# Patient Record
Sex: Male | Born: 1955 | Race: White | Hispanic: No | Marital: Married | Smoking: Current every day smoker
Health system: Southern US, Community
[De-identification: ages and names within clinical notes are randomized; demographics above are authoritative.]

## PROBLEM LIST (undated history)

## (undated) DIAGNOSIS — N189 Chronic kidney disease, unspecified: Secondary | ICD-10-CM

## (undated) DIAGNOSIS — R918 Other nonspecific abnormal finding of lung field: Secondary | ICD-10-CM

## (undated) DIAGNOSIS — F419 Anxiety disorder, unspecified: Secondary | ICD-10-CM

## (undated) DIAGNOSIS — I10 Essential (primary) hypertension: Secondary | ICD-10-CM

## (undated) DIAGNOSIS — J449 Chronic obstructive pulmonary disease, unspecified: Secondary | ICD-10-CM

## (undated) DIAGNOSIS — R011 Cardiac murmur, unspecified: Secondary | ICD-10-CM

## (undated) DIAGNOSIS — M1990 Unspecified osteoarthritis, unspecified site: Secondary | ICD-10-CM

## (undated) DIAGNOSIS — M549 Dorsalgia, unspecified: Secondary | ICD-10-CM

## (undated) DIAGNOSIS — G4730 Sleep apnea, unspecified: Secondary | ICD-10-CM

## (undated) DIAGNOSIS — IMO0001 Reserved for inherently not codable concepts without codable children: Secondary | ICD-10-CM

## (undated) DIAGNOSIS — M5090 Cervical disc disorder, unspecified, unspecified cervical region: Secondary | ICD-10-CM

## (undated) HISTORY — PX: OTHER SURGICAL HISTORY: SHX169

## (undated) HISTORY — PX: BACK SURGERY: SHX140

---

## 2002-05-28 ENCOUNTER — Encounter: Payer: Self-pay | Admitting: Family Medicine

## 2002-05-28 ENCOUNTER — Ambulatory Visit (HOSPITAL_COMMUNITY): Admission: RE | Admit: 2002-05-28 | Discharge: 2002-05-28 | Payer: Self-pay | Admitting: Family Medicine

## 2008-05-01 ENCOUNTER — Ambulatory Visit (HOSPITAL_COMMUNITY): Admission: RE | Admit: 2008-05-01 | Discharge: 2008-05-01 | Payer: Self-pay | Admitting: Family Medicine

## 2008-09-25 ENCOUNTER — Ambulatory Visit (HOSPITAL_COMMUNITY): Admission: RE | Admit: 2008-09-25 | Discharge: 2008-09-25 | Payer: Self-pay | Admitting: Family Medicine

## 2008-12-30 ENCOUNTER — Emergency Department (HOSPITAL_COMMUNITY): Admission: EM | Admit: 2008-12-30 | Discharge: 2008-12-30 | Payer: Self-pay | Admitting: Emergency Medicine

## 2009-01-03 ENCOUNTER — Ambulatory Visit (HOSPITAL_COMMUNITY): Admission: RE | Admit: 2009-01-03 | Discharge: 2009-01-03 | Payer: Self-pay | Admitting: Family Medicine

## 2010-05-15 ENCOUNTER — Emergency Department (HOSPITAL_COMMUNITY)
Admission: EM | Admit: 2010-05-15 | Discharge: 2010-05-15 | Payer: Self-pay | Source: Home / Self Care | Admitting: Emergency Medicine

## 2010-09-20 LAB — BASIC METABOLIC PANEL
Chloride: 106 mEq/L (ref 96–112)
GFR calc Af Amer: >60 mL/min (ref >60)
Potassium: 4.4 mEq/L (ref 3.5–5.1)
Sodium: 139 mEq/L (ref 135–145)

## 2010-09-20 LAB — URINALYSIS, ROUTINE W REFLEX MICROSCOPIC
Ketones, ur: NEGATIVE mg/dL
Nitrite: NEGATIVE
Protein, ur: NEGATIVE mg/dL
Urobilinogen, UA: 0.2 mg/dL (ref 0.0–1.0)
pH: 6 (ref 5.0–8.0)

## 2010-09-20 LAB — DIFFERENTIAL
Eosinophils Absolute: 0.3 10*3/uL (ref 0.0–0.7)
Eosinophils Relative: 4 % (ref 0–5)
Lymphocytes Relative: 29 % (ref 12–46)
Lymphs Abs: 2.7 10*3/uL (ref 0.7–4.0)
Monocytes Relative: 7 % (ref 3–12)

## 2010-09-20 LAB — CBC
HCT: 47.5 % (ref 39.0–52.0)
Hemoglobin: 16.4 g/dL (ref 13.0–17.0)
MCV: 91 fL (ref 78.0–100.0)
RBC: 5.22 MIL/uL (ref 4.22–5.81)
WBC: 9.4 10*3/uL (ref 4.0–10.5)

## 2010-09-20 LAB — ETHANOL: Alcohol, Ethyl (B): <5 mg/dL (ref 0–10)

## 2010-09-20 LAB — RAPID URINE DRUG SCREEN, HOSP PERFORMED: Cocaine: NOT DETECTED

## 2011-10-05 NOTE — H&P (Signed)
Note made in error.   ROS  Physical Exam

## 2011-10-05 NOTE — H&P (Signed)
  NTS SOAP Note  Vital Signs:  Vitals as of: 10/05/2011: Systolic 169: Diastolic 82: Heart Rate 75: Temp 99.13F: Height 28ft 7in: Weight 155Lbs 0 Ounces: OFC Not Entered: Respiratory Rate Not Entered: O2 Saturation Not Entered: Pain Level Not Entered: BMI 24  BMI : 24.28 kg/m2  Subjective: This 29 Years 59 Months old Male presents forscreening TCS.  Never has had a colonoscopy.  No family h/o colon cancer.  No specific GI complaints.  Review of Symptoms:  Constitutional:unremarkable Head:unremarkable Eyes:blurred vision bilateral sinus Cardiovascular:unremarkable Respiratory:dyspnea Gastrointestinheartburn Genitourinary:unremarkable joint, back, neck pain Skin:unremarkable Hematolgic/Lymphatic:unremarkable Allergic/Immunologic:unremarkable   Past Medical History:Reviewed   Past Medical History  Surgical History: unremarkable Medical Problems: HTN Allergies: prozac Medications: prednisone, alprazolam, HTCZ, prednisone   Social History:Reviewed  Social History  Preferred Language: English ([place]) Race:  White Ethnicity: Not Hispanic / Latino Age: 62 Years 7 Months Marital Status:  M Alcohol:  No Recreational drug(s):  No   Smoking Status: Current every day smoker reviewed on 10/05/2011 Started Date: 06/14/1973 Packs per day: 1.00   Family History:Reviewed   Family History  Is there a family history ZO:XWRUEA h/o prostate cancer.  No h/o colon cancer    Objective Information: General:Well appearing, well nourished in no distress. Heart:RRR, no murmur or gallop.  Normal S1, S2.  No S3, S4.  Lungs:CTA bilaterally, no wheezes, rhonchi, rales.  Breathing unlabored. Abdomen:Soft, NT/ND, no HSM, no masses. deferred to procedure  Assessment:Need for screening TCS  Diagnosis &amp; Procedure: DiagnosisCode: V76.51, ProcedureCode: 54098,    Plan:Scheduled for TCS on 10/19/11.   Patient  Education:Alternative treatments to surgery were discussed with patient (and family).Risks and benefits  of procedure were fully explained to the patient (and family) who gave informed consent. Patient/family questions were addressed.  Follow-up:Pending Surgery

## 2011-10-18 MED ORDER — SODIUM CHLORIDE 0.45 % IV SOLN
Freq: Once | INTRAVENOUS | Status: AC
  Administered 2011-10-19: 1000 mL via INTRAVENOUS

## 2011-10-19 ENCOUNTER — Encounter (HOSPITAL_COMMUNITY): Payer: Self-pay | Admitting: *Deleted

## 2011-10-19 ENCOUNTER — Ambulatory Visit (HOSPITAL_COMMUNITY)
Admission: RE | Admit: 2011-10-19 | Discharge: 2011-10-19 | Disposition: A | Discharge status: Home / Self Care | Payer: 59 | Source: Ambulatory Visit | Attending: General Surgery | Admitting: General Surgery

## 2011-10-19 ENCOUNTER — Encounter (HOSPITAL_COMMUNITY)
Admission: RE | Disposition: A | Discharge status: Home / Self Care | Payer: Self-pay | Source: Ambulatory Visit | Attending: General Surgery

## 2011-10-19 DIAGNOSIS — I10 Essential (primary) hypertension: Secondary | ICD-10-CM | POA: Insufficient documentation

## 2011-10-19 DIAGNOSIS — Z79899 Other long term (current) drug therapy: Secondary | ICD-10-CM | POA: Insufficient documentation

## 2011-10-19 DIAGNOSIS — Z1211 Encounter for screening for malignant neoplasm of colon: Secondary | ICD-10-CM | POA: Insufficient documentation

## 2011-10-19 HISTORY — DX: Anxiety disorder, unspecified: F41.9

## 2011-10-19 HISTORY — DX: Dorsalgia, unspecified: M54.9

## 2011-10-19 HISTORY — PX: COLONOSCOPY: SHX5424

## 2011-10-19 SURGERY — COLONOSCOPY
Anesthesia: Moderate Sedation

## 2011-10-19 MED ORDER — STERILE WATER FOR IRRIGATION IR SOLN
Status: DC | PRN
  Administered 2011-10-19: 10:00:00

## 2011-10-19 MED ORDER — MEPERIDINE HCL 25 MG/ML IJ SOLN
INTRAMUSCULAR | Status: DC | PRN
  Administered 2011-10-19: 50 mg via INTRAVENOUS
  Administered 2011-10-19: 25 mg via INTRAVENOUS

## 2011-10-19 MED ORDER — MIDAZOLAM HCL 5 MG/5ML IJ SOLN
INTRAMUSCULAR | Status: DC | PRN
  Administered 2011-10-19 (×2): 1 mg via INTRAVENOUS
  Administered 2011-10-19: 4 mg via INTRAVENOUS

## 2011-10-19 MED ORDER — MEPERIDINE HCL 50 MG/ML IJ SOLN
INTRAMUSCULAR | Status: AC
  Filled 2011-10-19: qty 1

## 2011-10-19 MED ORDER — MIDAZOLAM HCL 2 MG/2ML IJ SOLN
INTRAMUSCULAR | Status: AC
  Filled 2011-10-19: qty 2

## 2011-10-19 MED ORDER — MIDAZOLAM HCL 5 MG/5ML IJ SOLN
INTRAMUSCULAR | Status: AC
  Filled 2011-10-19: qty 5

## 2011-10-19 NOTE — Interval H&P Note (Signed)
History and Physical Interval Note:  10/19/2011 9:35 AM  Russell [name]  has presented today for surgery, with the diagnosis of Special screening for malignant neoplasms, colon  The various methods of treatment have been discussed with the patient and family. After consideration of risks, benefits and other options for treatment, the patient has consented to  Procedure(s) (LRB): COLONOSCOPY (N/A) as a surgical intervention .  The patients' history has been reviewed, patient examined, no change in status, stable for surgery.  I have reviewed the patients' chart and labs.  Questions were answered to the patient's satisfaction.     Franky Macho A

## 2011-10-19 NOTE — Op Note (Signed)
[facility] [address] [place], [place]  [address]  COLONOSCOPY PROCEDURE REPORT  PATIENT:  Russell [name], Russell [name]  MR#:  [id number] BIRTHDATE:  04-21-1956, 55 yrs. old  GENDER:  male ENDOSCOPIST:  Franky Macho, MD REF. BY:  Gareth Morgan, M.D. PROCEDURE DATE:  10/19/2011 PROCEDURE:  Average-risk screening colonoscopy G0121 ASA CLASS:  Class II INDICATIONS:  Screening MEDICATIONS:   Versed 6 mg IV, demerol 75 mg IV  DESCRIPTION OF PROCEDURE:   After the risks benefits and alternatives of the procedure were thoroughly explained, informed consent was obtained.  Digital rectal exam was performed and revealed no abnormalities.   The [id number] ([id number]) endoscope was introduced through the anus and advanced to the cecum, which was identified by both the appendix and ileocecal valve, without limitations.  The quality of the prep was adequate..  The instrument was then slowly withdrawn as the colon was fully examined.  FINDINGS:  The cecum, ascending colon, transverse colon, descending colon, sigmoid colon, and rectum were all within normal limits.  Retroflexed views in the rectum revealed it was not tolerated by the patient. The scope was then withdrawn  from the cecum and the procedure completed. COMPLICATIONS:  None ENDOSCOPIC IMPRESSION: 1) Normal colon 2) It was not tolerated by the patient. RECOMMENDATIONS:  REPEAT EXAM:  In 10 year(s) for Colonoscopy.  ______________________________ Franky Macho, MD  CC:  Gareth Morgan MD  n. Rosalie DoctorFranky Macho at 10/19/2011 09:53 AM  Wylie Hail, [phone]

## 2011-10-19 NOTE — Discharge Instructions (Signed)
Colonoscopy Care After Read the instructions outlined below and refer to this sheet in the next few weeks. These discharge instructions provide you with general information on caring for yourself after you leave the hospital. Your doctor may also give you specific instructions. While your treatment has been planned according to the most current medical practices available, unavoidable complications occasionally occur. If you have any problems or questions after discharge, call your doctor. HOME CARE INSTRUCTIONS ACTIVITY:  You may resume your regular activity, but move at a slower pace for the next 24 hours.   Take frequent rest periods for the next 24 hours.   Walking will help get rid of the air and reduce the bloated feeling in your belly (abdomen).   No driving for 24 hours (because of the medicine (anesthesia) used during the test).   You may shower.   Do not sign any important legal documents or operate any machinery for 24 hours (because of the anesthesia used during the test).  NUTRITION:  Drink plenty of fluids.   You may resume your normal diet as instructed by your doctor.   Begin with a light meal and progress to your normal diet. Heavy or fried foods are harder to digest and may make you feel sick to your stomach (nauseated).   Avoid alcoholic beverages for 24 hours or as instructed.  MEDICATIONS:  You may resume your normal medications unless your doctor tells you otherwise.  WHAT TO EXPECT TODAY:  Some feelings of bloating in the abdomen.   Passage of more gas than usual.   Spotting of blood in your stool or on the toilet paper.  IF YOU HAD POLYPS REMOVED DURING THE COLONOSCOPY:  No aspirin products for 7 days or as instructed.   No alcohol for 7 days or as instructed.   Eat a soft diet for the next 24 hours.  FINDING OUT THE RESULTS OF YOUR TEST Not all test results are available during your visit. If your test results are not back during the visit, make an  appointment with your caregiver to find out the results. Do not assume everything is normal if you have not heard from your caregiver or the medical facility. It is important for you to follow up on all of your test results.  SEEK IMMEDIATE MEDICAL CARE IF:  You have more than a spotting of blood in your stool.   Your belly is swollen (abdominal distention).   You are nauseated or vomiting.   You have a fever.   You have abdominal pain or discomfort that is severe or gets worse throughout the day.  Document Released: 01/13/2004 Document Revised: 05/20/2011 Document Reviewed: 01/11/2008 ExitCare Patient Information 2012 [employer]. 

## 2011-10-29 ENCOUNTER — Encounter (HOSPITAL_COMMUNITY): Payer: Self-pay | Admitting: General Surgery

## 2012-02-22 ENCOUNTER — Ambulatory Visit (HOSPITAL_COMMUNITY)
Admission: RE | Admit: 2012-02-22 | Discharge: 2012-02-22 | Disposition: A | Discharge status: Home / Self Care | Payer: 59 | Source: Ambulatory Visit | Attending: Family Medicine | Admitting: Family Medicine

## 2012-02-22 ENCOUNTER — Other Ambulatory Visit (HOSPITAL_COMMUNITY): Payer: Self-pay | Admitting: Family Medicine

## 2012-02-22 DIAGNOSIS — M25529 Pain in unspecified elbow: Secondary | ICD-10-CM | POA: Insufficient documentation

## 2012-02-22 DIAGNOSIS — R059 Cough, unspecified: Secondary | ICD-10-CM | POA: Insufficient documentation

## 2012-02-22 DIAGNOSIS — R05 Cough: Secondary | ICD-10-CM

## 2012-02-22 DIAGNOSIS — M79609 Pain in unspecified limb: Secondary | ICD-10-CM | POA: Insufficient documentation

## 2014-05-16 ENCOUNTER — Encounter (HOSPITAL_COMMUNITY): Payer: Self-pay | Admitting: *Deleted

## 2014-05-16 ENCOUNTER — Inpatient Hospital Stay (HOSPITAL_COMMUNITY)
Admission: EM | Admit: 2014-05-16 | Discharge: 2014-05-17 | DRG: 292 | Disposition: A | Discharge status: Home / Self Care | Payer: BC Managed Care – PPO | Attending: Internal Medicine | Admitting: Internal Medicine

## 2014-05-16 ENCOUNTER — Emergency Department (HOSPITAL_COMMUNITY): Payer: BC Managed Care – PPO

## 2014-05-16 DIAGNOSIS — I509 Heart failure, unspecified: Secondary | ICD-10-CM | POA: Diagnosis not present

## 2014-05-16 DIAGNOSIS — F419 Anxiety disorder, unspecified: Secondary | ICD-10-CM | POA: Diagnosis present

## 2014-05-16 DIAGNOSIS — Z7952 Long term (current) use of systemic steroids: Secondary | ICD-10-CM

## 2014-05-16 DIAGNOSIS — N281 Cyst of kidney, acquired: Secondary | ICD-10-CM | POA: Diagnosis present

## 2014-05-16 DIAGNOSIS — Z8249 Family history of ischemic heart disease and other diseases of the circulatory system: Secondary | ICD-10-CM | POA: Diagnosis not present

## 2014-05-16 DIAGNOSIS — Z823 Family history of stroke: Secondary | ICD-10-CM

## 2014-05-16 DIAGNOSIS — R072 Precordial pain: Secondary | ICD-10-CM

## 2014-05-16 DIAGNOSIS — J849 Interstitial pulmonary disease, unspecified: Secondary | ICD-10-CM | POA: Diagnosis present

## 2014-05-16 DIAGNOSIS — Z886 Allergy status to analgesic agent status: Secondary | ICD-10-CM

## 2014-05-16 DIAGNOSIS — G8929 Other chronic pain: Secondary | ICD-10-CM | POA: Diagnosis present

## 2014-05-16 DIAGNOSIS — M549 Dorsalgia, unspecified: Secondary | ICD-10-CM | POA: Insufficient documentation

## 2014-05-16 DIAGNOSIS — R911 Solitary pulmonary nodule: Secondary | ICD-10-CM | POA: Diagnosis present

## 2014-05-16 DIAGNOSIS — I10 Essential (primary) hypertension: Secondary | ICD-10-CM | POA: Diagnosis present

## 2014-05-16 DIAGNOSIS — F17210 Nicotine dependence, cigarettes, uncomplicated: Secondary | ICD-10-CM | POA: Diagnosis present

## 2014-05-16 DIAGNOSIS — F411 Generalized anxiety disorder: Secondary | ICD-10-CM | POA: Insufficient documentation

## 2014-05-16 DIAGNOSIS — M51369 Other intervertebral disc degeneration, lumbar region without mention of lumbar back pain or lower extremity pain: Secondary | ICD-10-CM | POA: Insufficient documentation

## 2014-05-16 DIAGNOSIS — R0602 Shortness of breath: Secondary | ICD-10-CM

## 2014-05-16 DIAGNOSIS — J90 Pleural effusion, not elsewhere classified: Secondary | ICD-10-CM | POA: Diagnosis present

## 2014-05-16 DIAGNOSIS — M5489 Other dorsalgia: Secondary | ICD-10-CM

## 2014-05-16 DIAGNOSIS — R0600 Dyspnea, unspecified: Secondary | ICD-10-CM | POA: Diagnosis present

## 2014-05-16 DIAGNOSIS — Z720 Tobacco use: Secondary | ICD-10-CM | POA: Diagnosis present

## 2014-05-16 DIAGNOSIS — R079 Chest pain, unspecified: Secondary | ICD-10-CM | POA: Diagnosis present

## 2014-05-16 HISTORY — DX: Cervical disc disorder, unspecified, unspecified cervical region: M50.90

## 2014-05-16 HISTORY — DX: Essential (primary) hypertension: I10

## 2014-05-16 LAB — BASIC METABOLIC PANEL
Anion gap: 15 (ref 5–15)
BUN: 11 mg/dL (ref 6–23)
CO2: 25 mEq/L (ref 19–32)
Calcium: 11 mg/dL — ABNORMAL HIGH (ref 8.4–10.5)
Chloride: 98 mEq/L (ref 96–112)
Creatinine, Ser: 0.95 mg/dL (ref 0.50–1.35)
GFR calc Af Amer: >90 mL/min (ref >90)
GFR calc non Af Amer: 89 mL/min — ABNORMAL LOW (ref >90)
Glucose, Bld: 122 mg/dL — ABNORMAL HIGH (ref 70–99)
Potassium: 4.1 mEq/L (ref 3.7–5.3)
Sodium: 138 mEq/L (ref 137–147)

## 2014-05-16 LAB — MRSA PCR SCREENING: MRSA by PCR: NEGATIVE

## 2014-05-16 LAB — LIPID PANEL
CHOLESTEROL: 183 mg/dL (ref 0–200)
HDL: 65 mg/dL (ref >39)
LDL Cholesterol: 93 mg/dL (ref 0–99)
TRIGLYCERIDES: 125 mg/dL (ref <150)
Total CHOL/HDL Ratio: 2.8 RATIO
VLDL: 25 mg/dL (ref 0–40)

## 2014-05-16 LAB — CBC
HCT: 46.7 % (ref 39.0–52.0)
Hemoglobin: 16 g/dL (ref 13.0–17.0)
MCH: 30.3 pg (ref 26.0–34.0)
MCHC: 34.3 g/dL (ref 30.0–36.0)
MCV: 88.4 fL (ref 78.0–100.0)
Platelets: 266 10*3/uL (ref 150–400)
RBC: 5.28 MIL/uL (ref 4.22–5.81)
RDW: 13.6 % (ref 11.5–15.5)
WBC: 13 10*3/uL — ABNORMAL HIGH (ref 4.0–10.5)

## 2014-05-16 LAB — TROPONIN I
Troponin I: <0.3 ng/mL (ref <0.30)
Troponin I: <0.3 ng/mL (ref <0.30)
Troponin I: <0.3 ng/mL (ref <0.30)

## 2014-05-16 LAB — PRO B NATRIURETIC PEPTIDE: Pro B Natriuretic peptide (BNP): 4779 pg/mL — ABNORMAL HIGH (ref 0–125)

## 2014-05-16 LAB — D-DIMER, QUANTITATIVE (NOT AT ARMC): D-Dimer, Quant: 1.04 ug/mL-FEU — ABNORMAL HIGH (ref 0.00–0.48)

## 2014-05-16 MED ORDER — NICOTINE 21 MG/24HR TD PT24
21.0000 mg | MEDICATED_PATCH | Freq: Every day | TRANSDERMAL | Status: DC
Start: 2014-05-16 — End: 2014-05-17
  Administered 2014-05-16 – 2014-05-17 (×2): 21 mg via TRANSDERMAL
  Filled 2014-05-16 (×2): qty 1

## 2014-05-16 MED ORDER — TRAZODONE HCL 50 MG PO TABS: 25.0000 mg | ORAL_TABLET | Freq: Every evening | ORAL | Status: DC | PRN

## 2014-05-16 MED ORDER — MORPHINE SULFATE 2 MG/ML IJ SOLN: 1.0000 mg | INTRAMUSCULAR | Status: DC | PRN

## 2014-05-16 MED ORDER — ACETAMINOPHEN 650 MG RE SUPP: 650.0000 mg | Freq: Four times a day (QID) | RECTAL | Status: DC | PRN

## 2014-05-16 MED ORDER — SENNA 8.6 MG PO TABS
1.0000 | ORAL_TABLET | Freq: Two times a day (BID) | ORAL | Status: DC
  Filled 2014-05-16: qty 1

## 2014-05-16 MED ORDER — NITROGLYCERIN IN D5W 200-5 MCG/ML-% IV SOLN
5.0000 ug/min | Freq: Once | INTRAVENOUS | Status: AC
Start: 2014-05-16 — End: 2014-05-16
  Administered 2014-05-16: 5 ug/min via INTRAVENOUS
  Filled 2014-05-16: qty 250

## 2014-05-16 MED ORDER — SODIUM CHLORIDE 0.9 % IJ SOLN
3.0000 mL | Freq: Two times a day (BID) | INTRAMUSCULAR | Status: DC
  Administered 2014-05-16 – 2014-05-17 (×2): 3 mL via INTRAVENOUS

## 2014-05-16 MED ORDER — SODIUM CHLORIDE 0.9 % IJ SOLN
INTRAMUSCULAR | Status: AC
  Filled 2014-05-16: qty 500

## 2014-05-16 MED ORDER — IPRATROPIUM-ALBUTEROL 0.5-2.5 (3) MG/3ML IN SOLN
3.0000 mL | Freq: Once | RESPIRATORY_TRACT | Status: AC
  Administered 2014-05-16: 3 mL via RESPIRATORY_TRACT
  Filled 2014-05-16: qty 3

## 2014-05-16 MED ORDER — ALBUTEROL SULFATE (2.5 MG/3ML) 0.083% IN NEBU
2.5000 mg | INHALATION_SOLUTION | Freq: Three times a day (TID) | RESPIRATORY_TRACT | Status: DC
  Administered 2014-05-17 (×2): 2.5 mg via RESPIRATORY_TRACT
  Filled 2014-05-16 (×2): qty 3

## 2014-05-16 MED ORDER — ACETAMINOPHEN 325 MG PO TABS: 650.0000 mg | ORAL_TABLET | Freq: Four times a day (QID) | ORAL | Status: DC | PRN

## 2014-05-16 MED ORDER — ENOXAPARIN SODIUM 40 MG/0.4ML ~~LOC~~ SOLN
40.0000 mg | SUBCUTANEOUS | Status: DC
  Administered 2014-05-16: 40 mg via SUBCUTANEOUS
  Filled 2014-05-16: qty 0.4

## 2014-05-16 MED ORDER — HYDROCODONE-ACETAMINOPHEN 5-325 MG PO TABS
1.0000 | ORAL_TABLET | ORAL | Status: DC | PRN
  Administered 2014-05-16 (×2): 2 via ORAL
  Filled 2014-05-16 (×2): qty 2

## 2014-05-16 MED ORDER — SODIUM CHLORIDE 0.9 % IJ SOLN: 3.0000 mL | INTRAMUSCULAR | Status: DC | PRN

## 2014-05-16 MED ORDER — ALPRAZOLAM 0.5 MG PO TABS: 0.5000 mg | ORAL_TABLET | Freq: Two times a day (BID) | ORAL | Status: DC | PRN

## 2014-05-16 MED ORDER — ONDANSETRON HCL 4 MG PO TABS: 4.0000 mg | ORAL_TABLET | Freq: Four times a day (QID) | ORAL | Status: DC | PRN

## 2014-05-16 MED ORDER — FUROSEMIDE 10 MG/ML IJ SOLN
40.0000 mg | Freq: Two times a day (BID) | INTRAMUSCULAR | Status: DC
  Administered 2014-05-16 – 2014-05-17 (×2): 40 mg via INTRAVENOUS
  Filled 2014-05-16 (×2): qty 4

## 2014-05-16 MED ORDER — BISACODYL 10 MG RE SUPP: 10.0000 mg | Freq: Every day | RECTAL | Status: DC | PRN

## 2014-05-16 MED ORDER — SODIUM CHLORIDE 0.9 % IV SOLN: 250.0000 mL | INTRAVENOUS | Status: DC | PRN

## 2014-05-16 MED ORDER — ONDANSETRON HCL 4 MG/2ML IJ SOLN
4.0000 mg | Freq: Four times a day (QID) | INTRAMUSCULAR | Status: DC | PRN
  Administered 2014-05-17: 4 mg via INTRAVENOUS
  Filled 2014-05-16 (×2): qty 2

## 2014-05-16 MED ORDER — ALBUTEROL SULFATE (2.5 MG/3ML) 0.083% IN NEBU
2.5000 mg | INHALATION_SOLUTION | RESPIRATORY_TRACT | Status: DC
  Administered 2014-05-16 (×2): 2.5 mg via RESPIRATORY_TRACT
  Filled 2014-05-16 (×2): qty 3

## 2014-05-16 MED ORDER — IOHEXOL 350 MG/ML SOLN
100.0000 mL | Freq: Once | INTRAVENOUS | Status: AC | PRN
  Administered 2014-05-16: 100 mL via INTRAVENOUS

## 2014-05-16 MED ORDER — FUROSEMIDE 10 MG/ML IJ SOLN
40.0000 mg | Freq: Once | INTRAMUSCULAR | Status: AC
  Administered 2014-05-16: 40 mg via INTRAVENOUS
  Filled 2014-05-16: qty 4

## 2014-05-16 MED ORDER — GUAIFENESIN-DM 100-10 MG/5ML PO SYRP: 5.0000 mL | ORAL_SOLUTION | ORAL | Status: DC | PRN

## 2014-05-16 MED ORDER — POLYETHYLENE GLYCOL 3350 17 G PO PACK: 17.0000 g | PACK | Freq: Every day | ORAL | Status: DC | PRN

## 2014-05-16 MED ORDER — ALUM & MAG HYDROXIDE-SIMETH 200-200-20 MG/5ML PO SUSP: 30.0000 mL | Freq: Four times a day (QID) | ORAL | Status: DC | PRN

## 2014-05-16 NOTE — ED Notes (Signed)
Pt co SOB with some chest pain especially with deep inspiration x2 days. Pt sent by Dr. Karilyn CotaGosrani. Pt states he has been sick for 2-3 weeks, worse over last 2 days.

## 2014-05-16 NOTE — H&P (Signed)
[facility] History and Physical  Russell [name] Loy ZOX:[id number] DOB: 1956-01-26 DOA: 05/16/2014  Referring physician:  PCP: Milana ObeyKNOWLTON,STEPHEN D, MD   Chief Complaint: dyspnea/chest pain  HPI: Russell [name] Buser is a 58 y.o. male with a past medical history that includes hypertension, tobacco use, anxiety, chronic neck pain results to the emergency department with the chief complaint persistent worsening shortness of breath with intermittent chest pain. Initial evaluation in the emergency department yields a chest x-ray with emphysema-like changes and proBNP 4779 with tachycardia and hypertension. Patient reports gradual persistent worsening of shortness of breath with activity progressing to shortness of breath at rest for 3 weeks. Associated symptoms include left anterior chest pain radiating through the back. He reports he had to leave work early due to worsening shortness of breath and chest pain. He indicates his work requires frequent stair climbing he was unable to do it last night. Associated symptoms include mild nausea no vomiting intermittent productive cough as well as lower extremity edema and orthopnea. Denies headache visual disturbances syncope or near-syncope. He reports activity made symptoms worse and rest improves symptoms. He reports compliance with his blood pressure medication. Workup in the emergency room includes complete metabolic panel significant for a calcium of 11.0 serum glucose of 122, complete blood count significant for WBCs of 13.0, proBNP 4779, d-dimer 1.04. CT angio of the chest. No pulmonary embolus is noted. There is mild mediastinal and bilateral hilar adenopathy. Attention on follow-up CT scan should be given.  Bilateral emphysematous changes. Hyperinflation. Bilateral small pleural effusion right greater than left. Central mild bronchitic changes. There is mild interstitial prominence bilaterally suspicious for mild interstitial edema superimposed on  chronic interstitial lung disease. There is a noncalcified nodule in left lower lobe posteriorly measures 7.5 by 7 mm. Second nodule in right lower lobe just posterior to fissure measures 5 mm. Cyst in upper pole of the right kidney measures 4.2 cm. distal troponin negative EKG with sinus tach ventricular trigeminy. He is hypertensive but not hypoxic. In the emergency department he was given 40 mg of Lasix and 1 albuterol breathing treatment nitroglycerin drip initiated. At the time of my exam dyspnea had improved chest pain subsided  Review of Systems:  10 point review of systems complete and all systems are negative except as indicated in the history of present illness  Past Medical History  Diagnosis Date  . PONV (postoperative nausea and vomiting)   . Hypertension   . Anxiety   . Back pain   . 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 Tobacco use    Past Surgical History  Procedure Laterality Date  . Back surgery    . Right knee      cartilage removed  . Colonoscopy  10/19/2011    Procedure: COLONOSCOPY;  Surgeon: Dalia HeadingMark A Jenkins, MD;  Location: [facility];  Service: Gastroenterology;  Laterality: N/A;   Social History:  reports that he has been smoking Cigarettes.  He has a 22.5 pack-year smoking history. He does not have any smokeless tobacco history on file. He reports that he does not drink alcohol or use illicit drugs. Father deceased in his 6670s with a history of CAD CABG and chronic lung disease. Mother is alive with CAD. 3 siblings whose collective health history positive for CAD and stroke. He works third shift at [employer] lives at home with his wife and is independent with ADLs Allergies  Allergen Reactions  . Asa [Aspirin] Diarrhea and Nausea  And Vomiting  . Prozac [Fluoxetine Hcl] Other (See Comments)    confusion    History reviewed. No pertinent family history.   Prior to Admission medications   Medication Sig Start Date End Date Taking? Authorizing Provider    ALPRAZolam Prudy Feeler) 0.5 MG tablet Take 0.5 mg by mouth at bedtime as needed.   Yes Historical Provider, MD  Cyanocobalamin (VITAMIN B-12 PO) Take 1 tablet by mouth daily.   Yes Historical Provider, MD  hydrochlorothiazide (HYDRODIURIL) 25 MG tablet Take 25 mg by mouth daily.   Yes Historical Provider, MD  Multiple Vitamin (MULTI-VITAMIN PO) Take 1 tablet by mouth daily.   Yes Historical Provider, MD  naproxen sodium (ANAPROX) 220 MG tablet Take 220 mg by mouth 2 (two) times daily with a meal.   Yes Historical Provider, MD  predniSONE (DELTASONE) 10 MG tablet Take 20 mg by mouth 2 (two) times daily with a meal.  05/13/14  Yes Historical Provider, MD   Physical Exam: Filed Vitals:   05/16/14 1100 05/16/14 1103 05/16/14 1200 05/16/14 1300  BP: 135/94  152/68 145/104  Pulse: 51  66 69  Temp:      TempSrc:      Resp: 20  22 28   Height:      Weight:      SpO2: 96% 99% 97% 93%    Wt Readings from Last 3 Encounters:  05/16/14 68.493 kg (151 lb)  10/19/11 70.308 kg (155 lb)    General:  Appears somewhat anxious but comfortable Eyes: PERRL, normal lids, irises & conjunctiva ENT: grossly normal hearing, his membranes of his mouth are moist and pink Neck: no LAD, masses or thyromegaly Cardiovascular: RRR with occasional a extra beat, no m/[name]. TraceLE edema. Respiratory: Mild increased work of breathing with activity good air flow bilateral crackles in bases no wheeze no rhonchi Abdomen: soft, nondistended nontender positive bowel sounds Skin: no rash or induration seen on limited exam Musculoskeletal: grossly normal tone BUE/BLE Psychiatric: grossly normal mood and affect, speech fluent and appropriate Neurologic: grossly non-focal. Speech clear facial symmetry bilateral grip 5 over 5           Labs on Admission:  Basic Metabolic Panel:  Recent Labs Lab 05/16/14 1033  NA 138  K 4.1  CL 98  CO2 25  GLUCOSE 122*  BUN 11  CREATININE 0.95  CALCIUM 11.0*   Liver Function  Tests: No results for input(s): AST, ALT, ALKPHOS, BILITOT, PROT, ALBUMIN in the last 168 hours. No results for input(s): LIPASE, AMYLASE in the last 168 hours. No results for input(s): AMMONIA in the last 168 hours. CBC:  Recent Labs Lab 05/16/14 1033  WBC 13.0*  HGB 16.0  HCT 46.7  MCV 88.4  PLT 266   Cardiac Enzymes:  Recent Labs Lab 05/16/14 1033  TROPONINI <0.30    BNP (last 3 results)  Recent Labs  05/16/14 1111  PROBNP 4779.0*   CBG: No results for input(s): GLUCAP in the last 168 hours.  Radiological Exams on Admission: Dg Chest 2 View  05/16/2014   CLINICAL DATA:  Shortness of breath for several days  EXAM: CHEST  2 VIEW  COMPARISON:  February 22, 2012  FINDINGS: There is underlying emphysema. There is moderate interstitial edema. There is no airspace consolidation. Heart is borderline enlarged with mild pulmonary venous hypertension. No adenopathy. No bone lesions.  IMPRESSION: Evidence of a degree of congestive heart failure superimposed on emphysematous change. No airspace consolidation.   Electronically Signed   By:  Bretta BangWilliam  Woodruff M.D.   On: 05/16/2014 11:04   Ct Angio Chest Pe W/cm &/or Wo Cm  05/16/2014   CLINICAL DATA:  Dyspnea, chest pain with deep inspiration for 2 days  EXAM: CT ANGIOGRAPHY CHEST WITH CONTRAST  TECHNIQUE: Multidetector CT imaging of the chest was performed using the standard protocol during bolus administration of intravenous contrast. Multiplanar CT image reconstructions and MIPs were obtained to evaluate the vascular anatomy.  CONTRAST:  100mL OMNIPAQUE IOHEXOL 350 MG/ML SOLN  COMPARISON:  None.  FINDINGS: Sagittal images of the spine are unremarkable. Images of the thoracic inlet are unremarkable.  No mediastinal hematoma. Sub- carinal lymph node measures 1.4 x 1.5 cm. Right hilar lymph node measures 1.8 x 1.4 cm. Left hilar lymph node measures 1.6 x 1.2 cm.  AP window lymph node measures 1.3 x 1.4 cm. The study is of excellent  technical quality. No pulmonary embolus is noted. Central mild bronchitic changes.  There is hyperinflation. Bilateral emphysematous changes are noted. Small bilateral pleural effusion. There is no segmental infiltrate. There is mild interstitial prominence bilaterally suspicious for mild interstitial edema.  In axial image 59 there is non calcified nodule in left lower lobe posteriorly measures 7.5 x 7 mm. A second nodule in left lower lobe just posterior to the fissure measures 5 mm.  Paraseptal emphysematous bullae are noted bilateral apex. There is paraseptal emphysematous bulla in right lower lobe pericardiac.  The visualized upper abdomen shows no adrenal gland mass. There is a cyst in upper pole of the right kidney measures 4.2 cm.  Review of the MIP images confirms the above findings.  IMPRESSION: 1. No pulmonary embolus is noted. There is mild mediastinal and bilateral hilar adenopathy. Attention on follow-up CT scan should be given. 2. Bilateral emphysematous changes. Hyperinflation. Bilateral small pleural effusion right greater than left. Central mild bronchitic changes. There is mild interstitial prominence bilaterally suspicious for mild interstitial edema superimposed on chronic interstitial lung disease. 3. There is a noncalcified nodule in left lower lobe posteriorly measures 7.5 by 7 mm. Second nodule in right lower lobe just posterior to fissure measures 5 mm. If the patient is at high risk for bronchogenic carcinoma, follow-up chest CT at 3-486months is recommended. If the patient is at low risk for bronchogenic carcinoma, follow-up chest CT at 6-12 months is recommended. This recommendation follows the consensus statement: Guidelines for Management of Small Pulmonary Nodules Detected on CT Scans: A Statement from the Fleischner Society as published in Radiology 2005; 237:395-400. 4. Cyst in upper pole of the right kidney measures 4.2 cm.   Electronically Signed   By: Natasha MeadLiviu  Pop M.D.   On:  05/16/2014 12:02    EKG: Independently reviewed on his tach with ventricular trigeminy  Assessment/Plan Principal Problem:   Dyspnea: A multi-factorial specifically chronic interstitial lung disease with possible acute heart failure. CT of chest also with bilateral pleural effusion and lung nodules. Admit to telemetry. Will continue breathing treatments and Lasix that was started in the emergency department. Will obtain 2-D echo. Will monitor intake and output and obtain daily weights. Active Problems:   Chest pain: Typical and atypical features in patient with risk factors specifically smoking family history. Cycle troponin and get serial EKGs. Provide supportive therapy in the form of nitroglycerin glycerin and morphine for any chest pain. Obtain a lipid panel.  reports allergy to aspirin. Consider statin once lipid panel back. Likely benefit from stress test at some point.  Chronic interstitial lung disease: Per CT. He  does continue to smoke. Will continue his breathing treatments. I don't hear any wheezing on admission so I won't start steroids at this point. Of note patient takes when necessary prednisone for chronic neck pain. Attention saturation level greater than 90% on room air. Would likely benefit from pulmonary function tests on outpatient basis.   HTN: uncontrolled. NTG gtt initiated in ED. At time of my exam BP134/101. Will continue NTG drip for now. Home meds include HCTZ. Hold given lasix    Pleural effusion: Monitor per protocol given patient's long history of tobacco use   Lung nodule:  long history of tobacco abuse.Recommend follow-up CT 3-6 months.    Kidney cysts: Significant unclear. Will monitor intake and output. Monitor outpatient basis     Tobacco abuse: Cessation counseling provided    Code Status: full DVT Prophylaxis: Family Communication: wife at bedside Disposition Plan: home when ready  Time spent: 87 mintues  [facility] Pager  [phone]

## 2014-05-16 NOTE — ED Provider Notes (Signed)
CSN: [id number]     Arrival date & time 05/16/14  1019 History  This chart was scribed for Raeford RazorStephen Dinesha Twiggs, MD by Annye AsaAnna Dorsett, ED Scribe. This patient was seen in room APA10/AP[facility]10 and the patient's care was started at 10:25 AM.    Chief Complaint  Patient presents with  . Shortness of Breath  . Chest Pain   Patient is a 58 y.o. male presenting with chest pain. The history is provided by the patient. No language interpreter was used.  Chest Pain Associated symptoms: shortness of breath      HPI Comments: Russell [name] is a 58 y.o. male with past medical history of HTN who presents to the Emergency Department byDr. Karilyn CotaGosrani complaining of 3 weeks of worsening SOB and chest pain, acutely worsening over the past two days. His chest pain increases with deep breathing. Patient reports headaches and appetite decrease. He denies nausea. He denies history of blood clots, recent surgery or car/plane trips or other periods of long term immobility. He denies a personal history of heart problems; wife reports a family history of heart problems (maternal afib, other family unspecified). Patient is a 1-2 cigarette per day smoker.   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   Past Medical History  Diagnosis Date  . PONV (postoperative nausea and vomiting)   . Hypertension   . Anxiety   . Back pain    Past Surgical History  Procedure Laterality Date  . Back surgery    . Right knee      cartilage removed  . Colonoscopy  10/19/2011    Procedure: COLONOSCOPY;  Surgeon: Dalia HeadingMark A Jenkins, MD;  Location: [facility];  Service: Gastroenterology;  Laterality: N/A;   No family history on file. History  Substance Use Topics  . Smoking status: Current Every Day Smoker -- 1.50 packs/day for 15 years    Types: Cigarettes  . Smokeless tobacco: Not on file  . Alcohol Use: No    Review of  Systems  Respiratory: Positive for shortness of breath.   Cardiovascular: Positive for chest pain.  All other systems reviewed and are negative.     Allergies  Asa and Prozac  Home Medications   Prior to Admission medications   Medication Sig Start Date End Date Taking? Authorizing Provider  ALPRAZolam Prudy Feeler(XANAX) 0.5 MG tablet Take 0.5 mg by mouth at bedtime as needed.    Historical Provider, MD  hydrochlorothiazide (HYDRODIURIL) 25 MG tablet Take 25 mg by mouth daily.    Historical Provider, MD   There were no vitals taken for this visit. Physical Exam  Constitutional: He is oriented to person, place, and time. He appears well-developed and well-nourished. No distress.  HENT:  Head: Normocephalic.  Eyes: Conjunctivae are normal. Pupils are equal, round, and reactive to light. No scleral icterus.  Neck: Normal range of motion. Neck supple. No thyromegaly present.  Cardiovascular: Normal rate and regular rhythm.  Exam reveals no gallop and no friction rub.   No murmur heard. Pulmonary/Chest: Effort normal and breath sounds normal. No respiratory distress. He has no wheezes. He has no rales.  Coarse breath sounds right upper and right lower lobes; tachypnea   Abdominal: Soft. Bowel sounds are normal. He exhibits no distension. There is no tenderness. There is no rebound.  Musculoskeletal: Normal range of motion.  No lower extremity edema, no calf tenderness  Neurological: He  is alert and oriented to person, place, and time.  Skin: Skin is warm and dry. No rash noted.  Psychiatric: He has a normal mood and affect. His behavior is normal.  Nursing note and vitals reviewed.   ED Course  Procedures   DIAGNOSTIC STUDIES: Oxygen Saturation is 98% on RA, normal by my interpretation.    COORDINATION OF CARE: 10:32 AM Discussed treatment plan with pt at bedside and pt agreed to plan.   Labs Review Labs Reviewed  CBC - Abnormal; Notable for the following:    WBC 13.0 (*)    All  other components within normal limits  BASIC METABOLIC PANEL - Abnormal; Notable for the following:    Glucose, Bld 122 (*)    Calcium 11.0 (*)    GFR calc non Af Amer 89 (*)    All other components within normal limits  D-DIMER, QUANTITATIVE - Abnormal; Notable for the following:    D-Dimer, Quant 1.04 (*)    All other components within normal limits  PRO B NATRIURETIC PEPTIDE - Abnormal; Notable for the following:    Pro B Natriuretic peptide (BNP) 4779.0 (*)    All other components within normal limits  BASIC METABOLIC PANEL - Abnormal; Notable for the following:    Potassium 3.4 (*)    Glucose, Bld 118 (*)    Calcium 10.6 (*)    GFR calc non Af Amer 88 (*)    All other components within normal limits  CBC - Abnormal; Notable for the following:    WBC 11.5 (*)    All other components within normal limits  MRSA PCR SCREENING  TROPONIN I  LIPID PANEL  TSH  TROPONIN I  TROPONIN I    Imaging Review No results found.   EKG Interpretation   Date/Time:  Thursday May 16 2014 11:45:07 EST Ventricular Rate:  110 PR Interval:  161 QRS Duration: 101 QT Interval:  351 QTC Calculation: 475 R Axis:   73 Text Interpretation:  Sinus tachycardia Ventricular trigeminy LAE,  consider biatrial enlargement Left ventricular hypertrophy Nonspecific T  abnormalities, lateral leads ED PHYSICIAN INTERPRETATION AVAILABLE IN [facility] Confirmed by TEST, Record ([id number]) on 05/19/2014 11:57:37 AM      MDM   Final diagnoses:  Shortness of breath  Dyspnea    I personally preformed the services scribed in my presence. The recorded information has been reviewed is accurate. Raeford RazorStephen Jenya Putz, MD.      Raeford RazorStephen Paradise Vensel, MD 05/24/14 [phone]

## 2014-05-16 NOTE — Plan of Care (Signed)
Problem: Phase I Progression Outcomes Goal: Hemodynamically stable Outcome: Progressing Goal: Anginal pain relieved Outcome: Completed/Met Date Met:  05/16/14 Goal: Aspirin unless contraindicated Outcome: Progressing Goal: MD aware of Cardiac Marker results Outcome: Progressing Goal: Voiding-avoid urinary catheter unless indicated Outcome: Completed/Met Date Met:  05/16/14

## 2014-05-16 NOTE — ED Notes (Signed)
Gave patient drink as requested and approved by MD. 

## 2014-05-16 NOTE — ED Notes (Signed)
Patient had 500 ml urine output emptied from urinal.

## 2014-05-16 NOTE — ED Notes (Signed)
Patient had 550 ml urine emptied from urinal.

## 2014-05-16 NOTE — ED Notes (Signed)
Pt currently denies chest pain

## 2014-05-17 ENCOUNTER — Encounter (HOSPITAL_COMMUNITY): Payer: Self-pay | Admitting: Cardiology

## 2014-05-17 DIAGNOSIS — J8410 Pulmonary fibrosis, unspecified: Secondary | ICD-10-CM

## 2014-05-17 DIAGNOSIS — I059 Rheumatic mitral valve disease, unspecified: Secondary | ICD-10-CM

## 2014-05-17 DIAGNOSIS — R0609 Other forms of dyspnea: Secondary | ICD-10-CM

## 2014-05-17 DIAGNOSIS — Z720 Tobacco use: Secondary | ICD-10-CM

## 2014-05-17 LAB — BASIC METABOLIC PANEL
Anion gap: 13 (ref 5–15)
BUN: 14 mg/dL (ref 6–23)
CHLORIDE: 98 meq/L (ref 96–112)
CO2: 28 meq/L (ref 19–32)
Calcium: 10.6 mg/dL — ABNORMAL HIGH (ref 8.4–10.5)
Creatinine, Ser: 0.98 mg/dL (ref 0.50–1.35)
GFR calc Af Amer: >90 mL/min (ref >90)
GFR calc non Af Amer: 88 mL/min — ABNORMAL LOW (ref >90)
Glucose, Bld: 118 mg/dL — ABNORMAL HIGH (ref 70–99)
Potassium: 3.4 mEq/L — ABNORMAL LOW (ref 3.7–5.3)
SODIUM: 139 meq/L (ref 137–147)

## 2014-05-17 LAB — CBC
HEMATOCRIT: 43.7 % (ref 39.0–52.0)
HEMOGLOBIN: 14.9 g/dL (ref 13.0–17.0)
MCH: 30.2 pg (ref 26.0–34.0)
MCHC: 34.1 g/dL (ref 30.0–36.0)
MCV: 88.6 fL (ref 78.0–100.0)
Platelets: 242 10*3/uL (ref 150–400)
RBC: 4.93 MIL/uL (ref 4.22–5.81)
RDW: 13.6 % (ref 11.5–15.5)
WBC: 11.5 10*3/uL — AB (ref 4.0–10.5)

## 2014-05-17 LAB — TSH: TSH: 0.638 u[IU]/mL (ref 0.350–4.500)

## 2014-05-17 MED ORDER — PROMETHAZINE HCL 25 MG/ML IJ SOLN
INTRAMUSCULAR | Status: AC
  Filled 2014-05-17: qty 1

## 2014-05-17 MED ORDER — HYDROCODONE-ACETAMINOPHEN 5-325 MG PO TABS: 1.0000 | ORAL_TABLET | Freq: Four times a day (QID) | ORAL | Status: DC | PRN

## 2014-05-17 MED ORDER — PROMETHAZINE HCL 25 MG/ML IJ SOLN: 6.2500 mg | Freq: Once | INTRAMUSCULAR | Status: DC

## 2014-05-17 MED ORDER — ALPRAZOLAM 0.5 MG PO TABS: 0.5000 mg | ORAL_TABLET | Freq: Two times a day (BID) | ORAL | Status: DC | PRN

## 2014-05-17 NOTE — Discharge Summary (Signed)
Physician Discharge Summary  Russell [name] ZOX:[id number] DOB: [birth year] DOA: 05/16/2014  PCP: Russell Obey, MD  Admit date: 05/16/2014 Discharge date: 05/17/2014  Time spent: 40 minutes  Recommendations for Outpatient Follow-up:  1. Cardiology 05/22/14. Follow echo. Evaluate need for stress test 2.  Dr Sudie Bailey in 1-2 weeks for evaluation of respiratory status. Recommend consideration of PFT and recommend follow up CT 3 month to monitor lung nodules  Discharge Diagnoses:  Principal Problem:   Dyspnea Active Problems:   Chest pain   Tobacco abuse   Pleural effusion   Chronic interstitial lung disease   Lung nodule   Kidney cysts   Solitary pulmonary nodule   Essential hypertension   Discharge Condition: stable   Diet recommendation: heart healthy  Filed Weights   05/16/14 1028 05/16/14 1607 05/17/14 0500  Weight: 68.493 kg (151 lb) 65.3 kg (143 lb 15.4 oz) 65.9 kg (145 lb 4.5 oz)    History of present illness:  Russell [name] is a 58 y.o. male with a past medical history that includes hypertension, tobacco use, anxiety, chronic neck pain results to the emergency department with the chief complaint persistent worsening shortness of breath with intermittent chest pain. Initial evaluation in the emergency department yielded a chest x-ray with emphysema-like changes and proBNP 4779 with tachycardia and hypertension.  Patient reported gradual persistent worsening of shortness of breath with activity progressing to shortness of breath at rest for 3 weeks. Associated symptoms included left anterior chest pain radiating through the back. He reportd he had to leave work early due to worsening shortness of breath and chest pain. He indicated his work requires frequent stair climbing he was unable to do it. Associated symptoms included mild nausea no vomiting intermittent productive cough as well as lower extremity edema and orthopnea. Denied headache visual disturbances syncope  or near-syncope. He reported activity made symptoms worse and rest improves symptoms. He reports compliance with his blood pressure medication.  Workup in the emergency room included complete metabolic panel significant for a calcium of 11.0 serum glucose of 122, complete blood count significant for WBCs of 13.0, proBNP 4779, d-dimer 1.04. CT angio of the chest. No pulmonary embolus noted. There was mild mediastinal and bilateral hilar adenopathy. Attention on follow-up CT scan should be given. Bilateral emphysematous changes. Hyperinflation. Bilateral small pleural effusion right greater than left. Central mild bronchitic changes. There is mild interstitial prominence bilaterally suspicious for mild interstitial edema superimposed on chronic interstitial lung disease. There is a noncalcified nodule in left lower lobe posteriorly measures 7.5 by 7 mm. Second nodule in right lower lobe just posterior to fissure measures 5 mm. Cyst in upper pole of the right kidney measures 4.2 cm. distal troponin negative EKG with sinus tach ventricular trigeminy. He was hypertensive but not hypoxic. In the emergency department he was given 40 mg of Lasix and 1 albuterol breathing treatment nitroglycerin drip initiated.  Hospital Course:  Dyspnea: likely multi-factorial specifically chronic interstitial lung disease with possible acute heart failure. CT of chest also with bilateral pleural effusion and lung nodules as well as interstitial lung disease. Provided with breathing treatments and lasix with good results. Evaluated by cardiology who opine cardiac markers argue against ACS. Clinically much improved at discharge.  Official echo results pending but preliminary concerning for LVH. Discussed case with Dr Myrtis Ser who opined given no infarct in setting of pulmonary component and improved clinical presentation patient may be discharged to home with close OP follow up. Appointment with cardiology 05/22/14. Patient  instructed  to avoid physical exertion and to return to ED if symptoms return.  Active Problems:  Chest pain: resolved at discharge. Typical and atypical features in patient with risk factors specifically smoking family history. Troponin negative x3. Lipid panel unremarkable. see above  Chronic interstitial lung disease: Per CT. He does continue to smoke. Would benefit from pulmonary function tests on outpatient basis.   HTN: uncontrolled initially. NTG gtt initiated in ED and titrateddown. At discharge BP controlled   Pleural effusion: Monitor per protocol given patient's long history of tobacco use   Lung nodule: long history of tobacco abuse.Recommend follow-up CT 3-6 months.   Kidney cysts: Significance unclear. Will monitor intake and output. Monitor outpatient basis    Tobacco abuse: Cessation counseling provided  Procedures:    Consultations:  Dr Diona Browner cardiology  Dr Myrtis Ser cardiology via phone  Discharge Exam: Filed Vitals:   05/17/14 1600  BP: 129/68  Pulse: 48  Temp:   Resp: 20    General: well nourished appears well Cardiovascular: RRR +murmur no gallup or rub. No LE edema Respiratory: normal effort BS with diffuse coarseness no rales or wheeze  Discharge Instructions You were cared for by a hospitalist during your hospital stay. If you have any questions about your discharge medications or the care you received while you were in the hospital after you are discharged, you can call the unit and asked to speak with the hospitalist on call if the hospitalist that took care of you is not available. Once you are discharged, your primary care physician will handle any further medical issues. Please note that NO REFILLS for any discharge medications will be authorized once you are discharged, as it is imperative that you return to your primary care physician (or establish a relationship with a primary care physician if you do not have one) for your aftercare needs so that  they can reassess your need for medications and monitor your lab values.  Discharge Instructions    Diet - low sodium heart healthy    Complete by:  As directed      Diet - low sodium heart healthy    Complete by:  As directed      Discharge instructions    Complete by:  As directed   Do not return to work until cleared by cardiology No physical exertion If symptoms re-occur seek immediate medical attention     Increase activity slowly    Complete by:  As directed      Increase activity slowly    Complete by:  As directed           Current Discharge Medication List    START taking these medications   Details  HYDROcodone-acetaminophen (NORCO/VICODIN) 5-325 MG per tablet Take 1 tablet by mouth every 6 (six) hours as needed for moderate pain. Qty: 15 tablet, Refills: 0      CONTINUE these medications which have CHANGED   Details  ALPRAZolam (XANAX) 0.5 MG tablet Take 1 tablet (0.5 mg total) by mouth 2 (two) times daily as needed for anxiety or sleep. Qty: 30 tablet, Refills: 0      CONTINUE these medications which have NOT CHANGED   Details  Cyanocobalamin (VITAMIN B-12 PO) Take 1 tablet by mouth daily.    hydrochlorothiazide (HYDRODIURIL) 25 MG tablet Take 25 mg by mouth daily.    Multiple Vitamin (MULTI-VITAMIN PO) Take 1 tablet by mouth daily.    naproxen sodium (ANAPROX) 220 MG tablet Take 220 mg by  mouth 2 (two) times daily with a meal.    predniSONE (DELTASONE) 10 MG tablet Take 20 mg by mouth 2 (two) times daily with a meal.        Allergies  Allergen Reactions  . Asa [Aspirin] Diarrhea and Nausea And Vomiting  . Prozac [Fluoxetine Hcl] Other (See Comments)    confusion   Follow-up Information    Follow up with Russell ObeyKNOWLTON,STEPHEN D, MD. Schedule an appointment as soon as possible for a visit in 2 weeks.   Specialty:  Family Medicine   Why:  monitor respiratory status. consider PFT and repeat CT in 3 months   Contact information:   [address] [place] [address] [phone]        The results of significant diagnostics from this hospitalization (including imaging, microbiology, ancillary and laboratory) are listed below for reference.    Significant Diagnostic Studies: Dg Chest 2 View  05/16/2014   CLINICAL DATA:  Shortness of breath for several days  EXAM: CHEST  2 VIEW  COMPARISON:  February 22, 2012  FINDINGS: There is underlying emphysema. There is moderate interstitial edema. There is no airspace consolidation. Heart is borderline enlarged with mild pulmonary venous hypertension. No adenopathy. No bone lesions.  IMPRESSION: Evidence of a degree of congestive heart failure superimposed on emphysematous change. No airspace consolidation.   Electronically Signed   By: Bretta BangWilliam  Woodruff M.D.   On: 05/16/2014 11:04   Ct Angio Chest Pe W/cm &/or Wo Cm  05/16/2014   CLINICAL DATA:  Dyspnea, chest pain with deep inspiration for 2 days  EXAM: CT ANGIOGRAPHY CHEST WITH CONTRAST  TECHNIQUE: Multidetector CT imaging of the chest was performed using the standard protocol during bolus administration of intravenous contrast. Multiplanar CT image reconstructions and MIPs were obtained to evaluate the vascular anatomy.  CONTRAST:  100mL OMNIPAQUE IOHEXOL 350 MG/ML SOLN  COMPARISON:  None.  FINDINGS: Sagittal images of the spine are unremarkable. Images of the thoracic inlet are unremarkable.  No mediastinal hematoma. Sub- carinal lymph node measures 1.4 x 1.5 cm. Right hilar lymph node measures 1.8 x 1.4 cm. Left hilar lymph node measures 1.6 x 1.2 cm.  AP window lymph node measures 1.3 x 1.4 cm. The study is of excellent technical quality. No pulmonary embolus is noted. Central mild bronchitic changes.  There is hyperinflation. Bilateral emphysematous changes are noted. Small bilateral pleural effusion. There is no segmental infiltrate. There is mild interstitial prominence bilaterally suspicious for mild interstitial edema.  In axial image  59 there is non calcified nodule in left lower lobe posteriorly measures 7.5 x 7 mm. A second nodule in left lower lobe just posterior to the fissure measures 5 mm.  Paraseptal emphysematous bullae are noted bilateral apex. There is paraseptal emphysematous bulla in right lower lobe pericardiac.  The visualized upper abdomen shows no adrenal gland mass. There is a cyst in upper pole of the right kidney measures 4.2 cm.  Review of the MIP images confirms the above findings.  IMPRESSION: 1. No pulmonary embolus is noted. There is mild mediastinal and bilateral hilar adenopathy. Attention on follow-up CT scan should be given. 2. Bilateral emphysematous changes. Hyperinflation. Bilateral small pleural effusion right greater than left. Central mild bronchitic changes. There is mild interstitial prominence bilaterally suspicious for mild interstitial edema superimposed on chronic interstitial lung disease. 3. There is a noncalcified nodule in left lower lobe posteriorly measures 7.5 by 7 mm. Second nodule in right lower lobe just posterior to fissure measures  5 mm. If the patient is at high risk for bronchogenic carcinoma, follow-up chest CT at 3-76months is recommended. If the patient is at low risk for bronchogenic carcinoma, follow-up chest CT at 6-12 months is recommended. This recommendation follows the consensus statement: Guidelines for Management of Small Pulmonary Nodules Detected on CT Scans: A Statement from the Fleischner Society as published in Radiology 2005; 237:395-400. 4. Cyst in upper pole of the right kidney measures 4.2 cm.   Electronically Signed   By: Natasha MeadLiviu  Pop M.D.   On: 05/16/2014 12:02    Microbiology: Recent Results (from the past 240 hour(s))  MRSA PCR Screening     Status: None   Collection Time: 05/16/14  3:40 PM  Result Value Ref Range Status   MRSA by PCR NEGATIVE NEGATIVE Final    Comment:        The GeneXpert MRSA Assay (FDA approved for NASAL specimens only), is one component  of a comprehensive MRSA colonization surveillance program. It is not intended to diagnose MRSA infection nor to guide or monitor treatment for MRSA infections.      Labs: Basic Metabolic Panel:  Recent Labs Lab 05/16/14 1033 05/17/14 0406  NA 138 139  K 4.1 3.4*  CL 98 98  CO2 25 28  GLUCOSE 122* 118*  BUN 11 14  CREATININE 0.95 0.98  CALCIUM 11.0* 10.6*   Liver Function Tests: No results for input(s): AST, ALT, ALKPHOS, BILITOT, PROT, ALBUMIN in the last 168 hours. No results for input(s): LIPASE, AMYLASE in the last 168 hours. No results for input(s): AMMONIA in the last 168 hours. CBC:  Recent Labs Lab 05/16/14 1033 05/17/14 0406  WBC 13.0* 11.5*  HGB 16.0 14.9  HCT 46.7 43.7  MCV 88.4 88.6  PLT 266 242   Cardiac Enzymes:  Recent Labs Lab 05/16/14 1033 05/16/14 1556 05/16/14 2157  TROPONINI <0.30 <0.30 <0.30   BNP: BNP (last 3 results)  Recent Labs  05/16/14 1111  PROBNP 4779.0*   CBG: No results for input(s): GLUCAP in the last 168 hours.     SignedGwenyth Bender:  Ceniyah Thorp M  [facility] 05/17/2014, 4:14 PM

## 2014-05-17 NOTE — Progress Notes (Signed)
Spoke to echotechnician and pt is scheduled to have 2D echocardiogram performed around 1300.

## 2014-05-17 NOTE — Progress Notes (Signed)
  Echocardiogram 2D Echocardiogram has been performed.  Russell [name] 05/17/2014, 3:12 PM

## 2014-05-17 NOTE — Progress Notes (Signed)
Pt is to be discharged home today. Pt is in no acute distress, IV is out, all paperwork has been reviewed/discussed with patient, and there are no questions/concerns at this time. Assessment is unchanged from this morning. Pt is to be accompanied downstairs by staff and family via wheelchair.  

## 2014-05-17 NOTE — Care Management Note (Signed)
Pt is from home with self care, lives with wife. Pt has no HH services, DME's or medication needs prior to admission. Pt's PCP is Dr. Sudie BaileyKnowlton. Pt plan to discharge home with self care. No CM needs identified at this time.   Anticipated D/C - 05/17/2014

## 2014-05-17 NOTE — Discharge Summary (Deleted)
Physician Discharge Summary  LUX MEADERS ZOX:[id number] DOB: 03-Mar-1956 DOA: 05/16/2014  PCP: Milana Obey, MD  Admit date: 05/16/2014 Discharge date: 05/17/2014  Time spent: 40 minutes  Recommendations for Outpatient Follow-up:  1. Cardiology in  2. Dr Sudie Bailey in 1-2 weeks for evaluation of respiratory status. Recommend consideration of PFT and recommend follow up CT 3 month to monitor lung nodules  Discharge Diagnoses:  Principal Problem:   Dyspnea Active Problems:   Chest pain   Tobacco abuse   Pleural effusion   Chronic interstitial lung disease   Lung nodule   Kidney cysts   Solitary pulmonary nodule   Essential hypertension   Discharge Condition: stable  Diet recommendation: heart healthy  Filed Weights   05/16/14 1028 05/16/14 1607 05/17/14 0500  Weight: 68.493 kg (151 lb) 65.3 kg (143 lb 15.4 oz) 65.9 kg (145 lb 4.5 oz)    History of present illness:  Russell [name] is a 58 y.o. male with a past medical history that includes hypertension, tobacco use, anxiety, chronic neck pain results to the emergency department with the chief complaint persistent worsening shortness of breath with intermittent chest pain. Initial evaluation in the emergency department yielded a chest x-ray with emphysema-like changes and proBNP 4779 with tachycardia and hypertension.  Patient reported gradual persistent worsening of shortness of breath with activity progressing to shortness of breath at rest for 3 weeks. Associated symptoms included left anterior chest pain radiating through the back. He reportd he had to leave work early due to worsening shortness of breath and chest pain. He indicated his work requires frequent stair climbing he was unable to do it. Associated symptoms included mild nausea no vomiting intermittent productive cough as well as lower extremity edema and orthopnea. Denied headache visual disturbances syncope or near-syncope. He reported activity made  symptoms worse and rest improves symptoms. He reports compliance with his blood pressure medication.  Workup in the emergency room included complete metabolic panel significant for a calcium of 11.0 serum glucose of 122, complete blood count significant for WBCs of 13.0, proBNP 4779, d-dimer 1.04. CT angio of the chest. No pulmonary embolus  noted. There was mild mediastinal and bilateral hilar adenopathy. Attention on follow-up CT scan should be given. Bilateral emphysematous changes. Hyperinflation. Bilateral small pleural effusion right greater than left. Central mild bronchitic changes. There is mild interstitial prominence bilaterally suspicious for mild interstitial edema superimposed on chronic interstitial lung disease. There is a noncalcified nodule in left lower lobe posteriorly measures 7.5 by 7 mm. Second nodule in right lower lobe just posterior to fissure measures 5 mm. Cyst in upper pole of the right kidney measures 4.2 cm. distal troponin negative EKG with sinus tach ventricular trigeminy. He was hypertensive but not hypoxic. In the emergency department he was given 40 mg of Lasix and 1 albuterol breathing treatment nitroglycerin drip initiated.  Hospital Course:   Dyspnea: likelymulti-factorial specifically chronic interstitial lung disease with possible acute heart failure. CT of chest also with bilateral pleural effusion and lung nodules as well as interstitial lung disease. Provided with breathing treatments and lasix with good results. Evaluated by cardiology who opine cardiac markers argue against ACS. Active Problems:  Chest pain: Typical and atypical features in patient with risk factors specifically smoking family history. Troponin negative x3. Lipid panel unremarkable. Evaluated by Dr Diona Browner who recommends OP follow up.   Chronic interstitial lung disease: Per CT. He does continue to smoke. Would benefit from pulmonary function tests on outpatient basis.  HTN:  uncontrolled initially. NTG gtt initiated in ED and titrateddown. At discharge BP controlled    Pleural effusion: Monitor per protocol given patient's long history of tobacco use   Lung nodule: long history of tobacco abuse.Recommend follow-up CT 3-6 months.   Kidney cysts: Significance unclear. Will monitor intake and output. Monitor outpatient basis    Tobacco abuse: Cessation counseling provided    Procedures:  echo  Consultations:  Dr Diona BrownerMcDowell cardiology  Discharge Exam: Filed Vitals:   05/17/14 1000  BP: 105/61  Pulse: 84  Temp:   Resp: 19    General: well nourished appears comfortable Cardiovascular: RRR +murmur no LE edema Respiratory: normal effort BS coarse diffusely no wheeze  Discharge Instructions You were cared for by a hospitalist during your hospital stay. If you have any questions about your discharge medications or the care you received while you were in the hospital after you are discharged, you can call the unit and asked to speak with the hospitalist on call if the hospitalist that took care of you is not available. Once you are discharged, your primary care physician will handle any further medical issues. Please note that NO REFILLS for any discharge medications will be authorized once you are discharged, as it is imperative that you return to your primary care physician (or establish a relationship with a primary care physician if you do not have one) for your aftercare needs so that they can reassess your need for medications and monitor your lab values.   Current Discharge Medication List    START taking these medications   Details  HYDROcodone-acetaminophen (NORCO/VICODIN) 5-325 MG per tablet Take 1 tablet by mouth every 6 (six) hours as needed for moderate pain. Qty: 15 tablet, Refills: 0      CONTINUE these medications which have NOT CHANGED   Details  ALPRAZolam (XANAX) 0.5 MG tablet Take 0.5 mg by mouth at bedtime as needed.     Cyanocobalamin (VITAMIN B-12 PO) Take 1 tablet by mouth daily.    hydrochlorothiazide (HYDRODIURIL) 25 MG tablet Take 25 mg by mouth daily.    Multiple Vitamin (MULTI-VITAMIN PO) Take 1 tablet by mouth daily.    naproxen sodium (ANAPROX) 220 MG tablet Take 220 mg by mouth 2 (two) times daily with a meal.    predniSONE (DELTASONE) 10 MG tablet Take 20 mg by mouth 2 (two) times daily with a meal.        Allergies  Allergen Reactions  . Asa [Aspirin] Diarrhea and Nausea And Vomiting  . Prozac [Fluoxetine Hcl] Other (See Comments)    confusion      The results of significant diagnostics from this hospitalization (including imaging, microbiology, ancillary and laboratory) are listed below for reference.    Significant Diagnostic Studies: Dg Chest 2 View  05/16/2014   CLINICAL DATA:  Shortness of breath for several days  EXAM: CHEST  2 VIEW  COMPARISON:  February 22, 2012  FINDINGS: There is underlying emphysema. There is moderate interstitial edema. There is no airspace consolidation. Heart is borderline enlarged with mild pulmonary venous hypertension. No adenopathy. No bone lesions.  IMPRESSION: Evidence of a degree of congestive heart failure superimposed on emphysematous change. No airspace consolidation.   Electronically Signed   By: Bretta BangWilliam  Woodruff M.D.   On: 05/16/2014 11:04   Ct Angio Chest Pe W/cm &/or Wo Cm  05/16/2014   CLINICAL DATA:  Dyspnea, chest pain with deep inspiration for 2 days  EXAM: CT ANGIOGRAPHY CHEST WITH CONTRAST  TECHNIQUE: Multidetector CT imaging of the chest was performed using the standard protocol during bolus administration of intravenous contrast. Multiplanar CT image reconstructions and MIPs were obtained to evaluate the vascular anatomy.  CONTRAST:  OMNIPAQUE IOHEXOL 350 MG/ML SOLN  COMPARISON:  None.  FINDINGS: Sagittal images of the spine are unremarkable. Images of the thoracic inlet are unremarkable.  No mediastinal hematoma. Sub- carinal  lymph node measures 1.4 x 1.5 cm. Right hilar lymph node measures 1.8 x 1.4 cm. Left hilar lymph node measures 1.6 x 1.2 cm.  AP window lymph node measures 1.3 x 1.4 cm. The study is of excellent technical quality. No pulmonary embolus is noted. Central mild bronchitic changes.  There is hyperinflation. Bilateral emphysematous changes are noted. Small bilateral pleural effusion. There is no segmental infiltrate. There is mild interstitial prominence bilaterally suspicious for mild interstitial edema.  In axial image 59 there is non calcified nodule in left lower lobe posteriorly measures 7.5 x 7 mm. A second nodule in left lower lobe just posterior to the fissure measures 5 mm.  Paraseptal emphysematous bullae are noted bilateral apex. There is paraseptal emphysematous bulla in right lower lobe pericardiac.  The visualized upper abdomen shows no adrenal gland mass. There is a cyst in upper pole of the right kidney measures 4.2 cm.  Review of the MIP images confirms the above findings.  IMPRESSION: 1. No pulmonary embolus is noted. There is mild mediastinal and bilateral hilar adenopathy. Attention on follow-up CT scan should be given. 2. Bilateral emphysematous changes. Hyperinflation. Bilateral small pleural effusion right greater than left. Central mild bronchitic changes. There is mild interstitial prominence bilaterally suspicious for mild interstitial edema superimposed on chronic interstitial lung disease. 3. There is a noncalcified nodule in left lower lobe posteriorly measures 7.5 by 7 mm. Second nodule in right lower lobe just posterior to fissure measures 5 mm. If the patient is at high risk for bronchogenic carcinoma, follow-up chest CT at 3-76months is recommended. If the patient is at low risk for bronchogenic carcinoma, follow-up chest CT at 6-12 months is recommended. This recommendation follows the consensus statement: Guidelines for Management of Small Pulmonary Nodules Detected on CT Scans: A  Statement from the Fleischner Society as published in Radiology 2005; 237:395-400. 4. Cyst in upper pole of the right kidney measures 4.2 cm.   Electronically Signed   By: Natasha Mead M.D.   On: 05/16/2014 12:02    Microbiology: Recent Results (from the past 240 hour(s))  MRSA PCR Screening     Status: None   Collection Time: 05/16/14  3:40 PM  Result Value Ref Range Status   MRSA by PCR NEGATIVE NEGATIVE Final    Comment:        The GeneXpert MRSA Assay (FDA approved for NASAL specimens only), is one component of a comprehensive MRSA colonization surveillance program. It is not intended to diagnose MRSA infection nor to guide or monitor treatment for MRSA infections.      Labs: Basic Metabolic Panel:  Recent Labs Lab 05/16/14 1033 05/17/14 0406  NA 138 139  K 4.1 3.4*  CL 98 98  CO2 25 28  GLUCOSE 122* 118*  BUN 11 14  CREATININE 0.95 0.98  CALCIUM 11.0* 10.6*   Liver Function Tests: No results for input(s): AST, ALT, ALKPHOS, BILITOT, PROT, ALBUMIN in the last 168 hours. No results for input(s): LIPASE, AMYLASE in the last 168 hours. No results for input(s): AMMONIA in the last 168 hours. CBC:  Recent Labs  Lab 05/16/14 1033 05/17/14 0406  WBC 13.0* 11.5*  HGB 16.0 14.9  HCT 46.7 43.7  MCV 88.4 88.6  PLT 266 242   Cardiac Enzymes:  Recent Labs Lab 05/16/14 1033 05/16/14 1556 05/16/14 2157  TROPONINI <0.30 <0.30 <0.30   BNP: BNP (last 3 results)  Recent Labs  05/16/14 1111  PROBNP 4779.0*   CBG: No results for input(s): GLUCAP in the last 168 hours.     SignedGwenyth Bender:  BLACK,KAREN M  [facility] 05/17/2014, 11:01 AM

## 2014-05-17 NOTE — Consult Note (Signed)
Consulting cardiologist: Dr. Jonelle SidleSamuel G Shonnie [name]  Reason for consultation: Chest pain, shortness of breath, wheezing and cough  Clinical Summary [name] is a 58 y.o.male with past medical history outlined below, admitted with a three to four-week history of progressive dyspnea on exertion, intermittent productive cough, wheezing, and a feeling of vague chest tightness that is somewhat pleuritic in description. He has a 40+ pack year history of tobacco use, also hypertension. Lipid status is uncertain. He was initially managed with nitroglycerin infusion, also breathing treatments which seemed to do the most good in terms of symptom control. Today he feels more comfortable.  Troponin I levels are negative arguing against ACS. He does have frequent ventricular ectopy by telemetry and ECG, baseline LVH and LAE apparent. CT angiogram of the chest shows no pulmonary embolus following abnormal d-dimer, however significant bilateral emphysematous changes as well as mild interstitial edema superimposed on chronic interstitial lung disease. Noncalcified nodule noted in the left lower lobe, also right lower lobe as detailed in the report.  He has no personal history of CAD or myocardial infarction. No previous cardiac structural or ischemic evaluation.   Allergies  Allergen Reactions  . Asa [Aspirin] Diarrhea and Nausea And Vomiting  . Prozac [Fluoxetine Hcl] Other (See Comments)    confusion    Medications Scheduled Medications: . albuterol  2.5 mg Nebulization TID  . enoxaparin (LOVENOX) injection  40 mg Subcutaneous Q24H  . furosemide  40 mg Intravenous BID  . nicotine  21 mg Transdermal Daily  . senna  1 tablet Oral BID  . sodium chloride  3 mL Intravenous Q12H     PRN Medications: sodium chloride, acetaminophen **OR** acetaminophen, ALPRAZolam, alum & mag hydroxide-simeth, bisacodyl, guaiFENesin-dextromethorphan, HYDROcodone-acetaminophen, morphine injection, ondansetron **OR**  ondansetron (ZOFRAN) IV, polyethylene glycol, sodium chloride, traZODone   Past Medical History  Diagnosis Date  . Essential hypertension   . Anxiety   . Back pain   . Cervical disc disease     C6-7    Past Surgical History  Procedure Laterality Date  . Back surgery    . Right knee surgery      Cartilage removed  . Colonoscopy  10/19/2011    Procedure: COLONOSCOPY;  Surgeon: Russell HeadingMark A Jenkins, MD;  Location: [facility];  Service: Gastroenterology;  Laterality: N/A;    Family History  Problem Relation Age of Onset  . Atrial fibrillation Mother     Social History [name] reports that he has been smoking Cigarettes.  He has a 22.5 pack-year smoking history. He does not have any smokeless tobacco history on file. [name] reports that he does not drink alcohol.  Review of Systems Complete review of systems negative except as otherwise outlined in the clinical summary and also the following. No palpitations or syncope, no orthopnea or PND. Has mild ankle edema when he is up on his feet for several hours. Reports stable appetite, no unusual weight loss.  Physical Examination Blood pressure 120/61, pulse 63, temperature 97.3 F (36.3 C), temperature source Oral, resp. rate 15, height 5\' 8"  (1.727 m), weight 145 lb 4.5 oz (65.9 kg), SpO2 99 %. No intake or output data in the 24 hours ending 05/17/14 0857  Telemetry: Sinus rhythm with frequent PVCs.  Patient comfortable at rest this morning. HEENT: Conjunctiva and lids normal, oropharynx clear. Neck: Supple, no elevated JVP or carotid bruits, no thyromegaly. Lungs: Decreased breath sounds with scattered rhonchi, nonlabored breathing at rest. Cardiac: Regular rate and rhythm frequent ectopy,  no S3, soft systolic murmur, no pericardial rub. Abdomen: Soft, nontender, bowel sounds present, no guarding or rebound. Extremities: No pitting edema, distal pulses 2+. Skin: Warm and dry. Musculoskeletal: No  kyphosis. Neuropsychiatric: Alert and oriented x3, affect grossly appropriate.   Lab Results  Basic Metabolic Panel:  Recent Labs Lab 05/16/14 1033 05/17/14 0406  NA 138 139  K 4.1 3.4*  CL 98 98  CO2 25 28  GLUCOSE 122* 118*  BUN 11 14  CREATININE 0.95 0.98  CALCIUM 11.0* 10.6*    CBC:  Recent Labs Lab 05/16/14 1033 05/17/14 0406  WBC 13.0* 11.5*  HGB 16.0 14.9  HCT 46.7 43.7  MCV 88.4 88.6  PLT 266 242    Cardiac Enzymes:  Recent Labs Lab 05/16/14 1033 05/16/14 1556 05/16/14 2157  TROPONINI <0.30 <0.30 <0.30    BNP: 4779   ECG Sinus rhythm with LVH, LAE, and ventricular trigeminy.   Imaging EXAM: CT ANGIOGRAPHY CHEST WITH CONTRAST  TECHNIQUE: Multidetector CT imaging of the chest was performed using the standard protocol during bolus administration of intravenous contrast. Multiplanar CT image reconstructions and MIPs were obtained to evaluate the vascular anatomy.  CONTRAST: 100mL OMNIPAQUE IOHEXOL 350 MG/ML SOLN  COMPARISON: None.  FINDINGS: Sagittal images of the spine are unremarkable. Images of the thoracic inlet are unremarkable.  No mediastinal hematoma. Sub- carinal lymph node measures 1.4 x 1.5 cm. Right hilar lymph node measures 1.8 x 1.4 cm. Left hilar lymph node measures 1.6 x 1.2 cm.  AP window lymph node measures 1.3 x 1.4 cm. The study is of excellent technical quality. No pulmonary embolus is noted. Central mild bronchitic changes.  There is hyperinflation. Bilateral emphysematous changes are noted. Small bilateral pleural effusion. There is no segmental infiltrate. There is mild interstitial prominence bilaterally suspicious for mild interstitial edema.  In axial image 59 there is non calcified nodule in left lower lobe posteriorly measures 7.5 x 7 mm. A second nodule in left lower lobe just posterior to the fissure measures 5 mm.  Paraseptal emphysematous bullae are noted bilateral apex. There is paraseptal  emphysematous bulla in right lower lobe pericardiac.  The visualized upper abdomen shows no adrenal gland mass. There is a cyst in upper pole of the right kidney measures 4.2 cm.  Review of the MIP images confirms the above findings.  IMPRESSION: 1. No pulmonary embolus is noted. There is mild mediastinal and bilateral hilar adenopathy. Attention on follow-up CT scan should be given. 2. Bilateral emphysematous changes. Hyperinflation. Bilateral small pleural effusion right greater than left. Central mild bronchitic changes. There is mild interstitial prominence bilaterally suspicious for mild interstitial edema superimposed on chronic interstitial lung disease. 3. There is a noncalcified nodule in left lower lobe posteriorly measures 7.5 by 7 mm. Second nodule in right lower lobe just posterior to fissure measures 5 mm. If the patient is at high risk for bronchogenic carcinoma, follow-up chest CT at 3-586months is recommended. If the patient is at low risk for bronchogenic carcinoma, follow-up chest CT at 6-12 months is recommended. This recommendation follows the consensus statement: Guidelines for Management of Small Pulmonary Nodules Detected on CT Scans: A Statement from the Fleischner Society as published in Radiology 2005; 237:395-400. 4. Cyst in upper pole of the right kidney measures 4.2 cm.   Impression  1. Progressive dyspnea on exertion with intermittent chest discomfort, also associated with cough and wheezing. Cardiac markers argue against ACS. He does have evidence of significant emphysematous lung disease by chest CT and  also pulmonary nodules with long-standing tobacco use history. Certainly could be pulmonary component to symptoms, particularly as he improved with breathing treatments, however underlying CAD is also a possibility. He has a history of hypertension, uncertain lipid status.  2. Long-standing history of tobacco abuse, emphysema apparent by chest CT. Also  pulmonary nodules which will need follow-up by his primary care provider.  3. Essential hypertension.   Recommendations  Reviewed records and discussed case with patient and wife. At this point would recommend an echocardiogram to assess cardiac structure and function. If LVEF is normal without wall motion abnormalities, he can likely be considered for an outpatient Lexiscan Cardiolite following optimization of his pulmonary status. If on the other hand he has evidence of LV dysfunction, cardiac catheterization can be considered. As noted above, further pulmonary assessment is necessary, and he will need follow-up for his pulmonary nodules. We also discussed smoking cessation.  Russell Sidle, M.D., F.A.C.C.

## 2014-05-22 ENCOUNTER — Encounter: Payer: Self-pay | Admitting: Cardiovascular Disease

## 2014-05-22 ENCOUNTER — Encounter: Payer: Self-pay | Admitting: *Deleted

## 2014-05-22 ENCOUNTER — Other Ambulatory Visit: Payer: Self-pay | Admitting: Cardiovascular Disease

## 2014-05-22 ENCOUNTER — Ambulatory Visit (INDEPENDENT_AMBULATORY_CARE_PROVIDER_SITE_OTHER): Payer: 59 | Admitting: Cardiovascular Disease

## 2014-05-22 VITALS — BP 140/68 | HR 49 | Ht 68.0 in | Wt 147.6 lb

## 2014-05-22 DIAGNOSIS — Z720 Tobacco use: Secondary | ICD-10-CM

## 2014-05-22 DIAGNOSIS — I209 Angina pectoris, unspecified: Secondary | ICD-10-CM

## 2014-05-22 DIAGNOSIS — R0609 Other forms of dyspnea: Secondary | ICD-10-CM

## 2014-05-22 DIAGNOSIS — I350 Nonrheumatic aortic (valve) stenosis: Secondary | ICD-10-CM

## 2014-05-22 DIAGNOSIS — I5189 Other ill-defined heart diseases: Secondary | ICD-10-CM

## 2014-05-22 DIAGNOSIS — J438 Other emphysema: Secondary | ICD-10-CM

## 2014-05-22 DIAGNOSIS — I340 Nonrheumatic mitral (valve) insufficiency: Secondary | ICD-10-CM

## 2014-05-22 DIAGNOSIS — I351 Nonrheumatic aortic (valve) insufficiency: Secondary | ICD-10-CM

## 2014-05-22 DIAGNOSIS — I519 Heart disease, unspecified: Secondary | ICD-10-CM

## 2014-05-22 MED ORDER — CARVEDILOL 3.125 MG PO TABS: 3.1250 mg | ORAL_TABLET | Freq: Two times a day (BID) | ORAL | Status: DC

## 2014-05-22 NOTE — Progress Notes (Signed)
Patient ID: Russell [name], male   DOB: 1956-02-23, 58 y.o.   MRN: [id number]      SUBJECTIVE: The patient is a 58 yr old male who was recently hospitalized for chest pain and shortness of breath. He ruled out for an ACS. He has HTN, COPD, pulmonary nodules, and a long history of tobacco abuse. He was evaluated by Dr. Diona BrownerMcDowell. CT angiogram was negative for pulmonary embolus. He had frequent ventricular ectopy. He has no known prior h/o CAD or myocardial infarction. He underwent an echocardiogram prior to discharge on 05/17/14. This demonstrated severe left ventricular systolic dysfunction, EF 30%, severe left ventricular dilatation, severe aortic stenosis with mean gradient of 40 mmHg, moderate aortic regurgitation, moderate to severe mitral regurgitation, severe left atrial dilatation, and peak pulmonary pressures of 56 mmHg.  He gets dyspneic with minimal exertion and has a constant chest tightness, made worse with exertion. He seldom has lightheadedness and denies syncope. He has a strong family history of CAD in his father and uncles.  He is here with his wife, Bonita QuinLinda, and his older brother, Holiday representativeJunior.  Up until 3 weeks ago, he denies limitations with physical activity and had been chopping wood.   Review of Systems: As per "subjective", otherwise negative.  Allergies  Allergen Reactions  . Asa [Aspirin] Diarrhea and Nausea And Vomiting  . Prozac [Fluoxetine Hcl] Other (See Comments)    confusion    Current Outpatient Prescriptions  Medication Sig Dispense Refill  . ALPRAZolam (XANAX) 0.5 MG tablet Take 1 tablet (0.5 mg total) by mouth 2 (two) times daily as needed for anxiety or sleep. 30 tablet 0  . Cyanocobalamin (VITAMIN B-12 PO) Take 1 tablet by mouth daily.    . hydrochlorothiazide (HYDRODIURIL) 25 MG tablet Take 25 mg by mouth daily.    Marland Kitchen. HYDROcodone-acetaminophen (NORCO/VICODIN) 5-325 MG per tablet Take 1 tablet by mouth every 6 (six) hours as needed for moderate pain. 15  tablet 0  . Multiple Vitamin (MULTI-VITAMIN PO) Take 1 tablet by mouth daily.    . predniSONE (DELTASONE) 10 MG tablet Take 20 mg by mouth 2 (two) times daily with a meal.      No current facility-administered medications for this visit.    Past Medical History  Diagnosis Date  . Essential hypertension   . Anxiety   . Back pain   . Cervical disc disease     C6-7    Past Surgical History  Procedure Laterality Date  . Back surgery    . Right knee surgery      Cartilage removed  . Colonoscopy  10/19/2011    Procedure: COLONOSCOPY;  Surgeon: Dalia HeadingMark A Jenkins, MD;  Location: [facility];  Service: Gastroenterology;  Laterality: N/A;    History   Social History  . Marital Status: Married    Spouse Name: N/A    Number of Children: N/A  . Years of Education: N/A   Occupational History  . Not on file.   Social History Main Topics  . Smoking status: Former Smoker -- 1.50 packs/day for 15 years    Types: Cigarettes    Start date: 06/22/1968    Quit date: 05/08/2014  . Smokeless tobacco: Not on file  . Alcohol Use: No  . Drug Use: No  . Sexual Activity: Yes   Other Topics Concern  . Not on file   Social History Narrative     Filed Vitals:   05/22/14 1109  BP: 140/68  Pulse: 49  Height: 5'  8" (1.727 m)  Weight: 147 lb 9.6 oz (66.951 kg)   HR by auscultation: 70 bpm range  PHYSICAL EXAM General: NAD HEENT: Normal. Neck: No JVD, no thyromegaly. Lungs: Clear to auscultation bilaterally with normal respiratory effort. CV: Laterally displaced PMI.  Regular rate and rhythm, normal S1/S2, +S3, 3/6 ejection systolic murmur over RUSB, 3/6 apical holosystolic murmur, 2/4 holodiastolic murmur along left sternal border. No pretibial or periankle edema.  No carotid bruit.   Abdomen: Soft, nontender, no hepatosplenomegaly, no distention.  Neurologic: Alert and oriented x 3.  Psych: Normal affect. Skin: Normal. Musculoskeletal: Normal range of motion, no gross  deformities. Extremities: No clubbing or cyanosis.   ECG: Most recent ECG reviewed.      ASSESSMENT AND PLAN: 1. Chest pain and shortness of breath in the context of severe LV dysfunction, severe LV dilatation, severe aortic stenosis, moderate aortic regurgitation, and moderate to severe mitral regurgitation: I had a long discussion with the patient and his family regarding prognosis and further management plans. Will initiate Coreg 3.125 mg bid cautiously. Will not start ACEI at this time. He is allergic to ASA. I will arrange for right and left heart catheterization with coronary angiography on 05/23/14, as he will need aortic valve replacement and mitral valve repair, and may need CABG as well. He should follow up with CT surgery after cardiac catheterization. He will need preoperative TEE. He does not require diuretics at this time. 2. Essential HTN: Borderline today. Will start low-dose Coreg given severe aortic stenosis. Will need monitoring to see if he can tolerate this. 3. Tobacco abuse disorder  Dispo: cardiac cath tomorrow, 12/10, with f/u afterwards with CT surgery for further planning. Will need preop TEE. Subsequent f/u with Dr. Diona BrownerMcDowell.  Time spent: 40 minutes, of which >50% reviewing echo findings with patient and family and explanation of further management.  Prentice DockerSuresh Rashed Edler, M.D., F.A.C.C.

## 2014-05-22 NOTE — Patient Instructions (Addendum)
Your physician recommends that you schedule a follow-up appointment after your CATH  PLEASE SEE ATTACHED LETTER FOR INSTRUCTIONS  Your physician has recommended you make the following change in your medication:   START COREG 3.125 TWICE DAILY  Thank you for choosing [facility]!!

## 2014-05-23 ENCOUNTER — Encounter (HOSPITAL_COMMUNITY)
Admission: RE | Disposition: A | Discharge status: Home / Self Care | Payer: Self-pay | Source: Ambulatory Visit | Attending: Interventional Cardiology

## 2014-05-23 ENCOUNTER — Ambulatory Visit (HOSPITAL_COMMUNITY)
Admission: RE | Admit: 2014-05-23 | Discharge: 2014-05-23 | Disposition: A | Discharge status: Home / Self Care | Payer: BC Managed Care – PPO | Source: Ambulatory Visit | Attending: Interventional Cardiology | Admitting: Interventional Cardiology

## 2014-05-23 DIAGNOSIS — I10 Essential (primary) hypertension: Secondary | ICD-10-CM | POA: Diagnosis not present

## 2014-05-23 DIAGNOSIS — Z87891 Personal history of nicotine dependence: Secondary | ICD-10-CM | POA: Diagnosis not present

## 2014-05-23 DIAGNOSIS — I519 Heart disease, unspecified: Secondary | ICD-10-CM

## 2014-05-23 DIAGNOSIS — I2510 Atherosclerotic heart disease of native coronary artery without angina pectoris: Secondary | ICD-10-CM | POA: Insufficient documentation

## 2014-05-23 DIAGNOSIS — I350 Nonrheumatic aortic (valve) stenosis: Secondary | ICD-10-CM | POA: Diagnosis not present

## 2014-05-23 DIAGNOSIS — J449 Chronic obstructive pulmonary disease, unspecified: Secondary | ICD-10-CM | POA: Diagnosis not present

## 2014-05-23 DIAGNOSIS — I340 Nonrheumatic mitral (valve) insufficiency: Secondary | ICD-10-CM

## 2014-05-23 HISTORY — PX: CARDIAC CATHETERIZATION: SHX172

## 2014-05-23 HISTORY — PX: LEFT AND RIGHT HEART CATHETERIZATION WITH CORONARY ANGIOGRAM: SHX5449

## 2014-05-23 LAB — POCT I-STAT 3, ART BLOOD GAS (G3+)
ACID-BASE DEFICIT: 2 mmol/L (ref 0.0–2.0)
BICARBONATE: 21.6 meq/L (ref 20.0–24.0)
O2 SAT: 90 %
PO2 ART: 58 mmHg — AB (ref 80.0–100.0)
TCO2: 23 mmol/L (ref 0–100)
pCO2 arterial: 34 mmHg — ABNORMAL LOW (ref 35.0–45.0)
pH, Arterial: 7.411 (ref 7.350–7.450)

## 2014-05-23 LAB — POTASSIUM: Potassium: 4.6 meq/L (ref 3.7–5.3)

## 2014-05-23 LAB — POCT I-STAT 3, VENOUS BLOOD GAS (G3P V)
Bicarbonate: 24.8 mEq/L — ABNORMAL HIGH (ref 20.0–24.0)
O2 Saturation: 64 %
PH VEN: 7.396 — AB (ref 7.250–7.300)
PO2 VEN: 33 mmHg (ref 30.0–45.0)
TCO2: 26 mmol/L (ref 0–100)
pCO2, Ven: 40.5 mmHg — ABNORMAL LOW (ref 45.0–50.0)

## 2014-05-23 LAB — PROTIME-INR
INR: 1 (ref 0.00–1.49)
Prothrombin Time: 13.3 seconds (ref 11.6–15.2)

## 2014-05-23 SURGERY — LEFT AND RIGHT HEART CATHETERIZATION WITH CORONARY ANGIOGRAM
Anesthesia: LOCAL

## 2014-05-23 MED ORDER — SODIUM CHLORIDE 0.9 % IJ SOLN: 3.0000 mL | INTRAMUSCULAR | Status: DC | PRN

## 2014-05-23 MED ORDER — DIAZEPAM 5 MG PO TABS
ORAL_TABLET | ORAL | Status: AC
  Filled 2014-05-23: qty 1

## 2014-05-23 MED ORDER — HEPARIN SODIUM (PORCINE) 1000 UNIT/ML IJ SOLN
INTRAMUSCULAR | Status: AC
  Filled 2014-05-23: qty 1

## 2014-05-23 MED ORDER — SODIUM CHLORIDE 0.9 % IV SOLN: 250.0000 mL | INTRAVENOUS | Status: DC | PRN

## 2014-05-23 MED ORDER — NITROGLYCERIN 1 MG/10 ML FOR IR/CATH LAB
INTRA_ARTERIAL | Status: AC
  Filled 2014-05-23: qty 10

## 2014-05-23 MED ORDER — DIAZEPAM 5 MG PO TABS
5.0000 mg | ORAL_TABLET | Freq: Once | ORAL | Status: DC
  Administered 2014-05-23: 5 mg via ORAL

## 2014-05-23 MED ORDER — SODIUM CHLORIDE 0.9 % IV SOLN: INTRAVENOUS | Status: DC

## 2014-05-23 MED ORDER — SODIUM CHLORIDE 0.9 % IJ SOLN: 3.0000 mL | Freq: Two times a day (BID) | INTRAMUSCULAR | Status: DC

## 2014-05-23 MED ORDER — SODIUM CHLORIDE 0.9 % IV SOLN: 1.0000 mL/kg/h | INTRAVENOUS | Status: DC

## 2014-05-23 MED ORDER — FENTANYL CITRATE 0.05 MG/ML IJ SOLN
INTRAMUSCULAR | Status: AC
  Filled 2014-05-23: qty 2

## 2014-05-23 MED ORDER — MIDAZOLAM HCL 2 MG/2ML IJ SOLN
INTRAMUSCULAR | Status: AC
  Filled 2014-05-23: qty 2

## 2014-05-23 MED ORDER — LIDOCAINE HCL (PF) 1 % IJ SOLN
INTRAMUSCULAR | Status: AC
  Filled 2014-05-23: qty 30

## 2014-05-23 MED ORDER — VERAPAMIL HCL 2.5 MG/ML IV SOLN
INTRAVENOUS | Status: AC
  Filled 2014-05-23: qty 2

## 2014-05-23 MED ORDER — HEPARIN (PORCINE) IN NACL 2-0.9 UNIT/ML-% IJ SOLN
INTRAMUSCULAR | Status: AC
  Filled 2014-05-23: qty 1000

## 2014-05-23 NOTE — Progress Notes (Signed)
TR BAND REMOVAL  LOCATION:    right radial  DEFLATED PER PROTOCOL:    Yes.    TIME BAND OFF / DRESSING APPLIED:    1415   SITE UPON ARRIVAL:    Level 0  SITE AFTER BAND REMOVAL:    Level 0  CIRCULATION SENSATION AND MOVEMENT:    Within Normal Limits   Yes.    COMMENTS:   TRB REMOVED/ TEGADERM DSG APPLIED 

## 2014-05-23 NOTE — H&P (View-Only) (Signed)
Patient ID: Russell [name], male   DOB: 1956-02-23, 58 y.o.   MRN: [id number]      SUBJECTIVE: The patient is a 58 yr old male who was recently hospitalized for chest pain and shortness of breath. He ruled out for an ACS. He has HTN, COPD, pulmonary nodules, and a long history of tobacco abuse. He was evaluated by Dr. Diona BrownerMcDowell. CT angiogram was negative for pulmonary embolus. He had frequent ventricular ectopy. He has no known prior h/o CAD or myocardial infarction. He underwent an echocardiogram prior to discharge on 05/17/14. This demonstrated severe left ventricular systolic dysfunction, EF 30%, severe left ventricular dilatation, severe aortic stenosis with mean gradient of 40 mmHg, moderate aortic regurgitation, moderate to severe mitral regurgitation, severe left atrial dilatation, and peak pulmonary pressures of 56 mmHg.  He gets dyspneic with minimal exertion and has a constant chest tightness, made worse with exertion. He seldom has lightheadedness and denies syncope. He has a strong family history of CAD in his father and uncles.  He is here with his wife, Bonita QuinLinda, and his older brother, Holiday representativeJunior.  Up until 3 weeks ago, he denies limitations with physical activity and had been chopping wood.   Review of Systems: As per "subjective", otherwise negative.  Allergies  Allergen Reactions  . Asa [Aspirin] Diarrhea and Nausea And Vomiting  . Prozac [Fluoxetine Hcl] Other (See Comments)    confusion    Current Outpatient Prescriptions  Medication Sig Dispense Refill  . ALPRAZolam (XANAX) 0.5 MG tablet Take 1 tablet (0.5 mg total) by mouth 2 (two) times daily as needed for anxiety or sleep. 30 tablet 0  . Cyanocobalamin (VITAMIN B-12 PO) Take 1 tablet by mouth daily.    . hydrochlorothiazide (HYDRODIURIL) 25 MG tablet Take 25 mg by mouth daily.    Marland Kitchen. HYDROcodone-acetaminophen (NORCO/VICODIN) 5-325 MG per tablet Take 1 tablet by mouth every 6 (six) hours as needed for moderate pain. 15  tablet 0  . Multiple Vitamin (MULTI-VITAMIN PO) Take 1 tablet by mouth daily.    . predniSONE (DELTASONE) 10 MG tablet Take 20 mg by mouth 2 (two) times daily with a meal.      No current facility-administered medications for this visit.    Past Medical History  Diagnosis Date  . Essential hypertension   . Anxiety   . Back pain   . Cervical disc disease     C6-7    Past Surgical History  Procedure Laterality Date  . Back surgery    . Right knee surgery      Cartilage removed  . Colonoscopy  10/19/2011    Procedure: COLONOSCOPY;  Surgeon: Dalia HeadingMark A Jenkins, MD;  Location: [facility];  Service: Gastroenterology;  Laterality: N/A;    History   Social History  . Marital Status: Married    Spouse Name: N/A    Number of Children: N/A  . Years of Education: N/A   Occupational History  . Not on file.   Social History Main Topics  . Smoking status: Former Smoker -- 1.50 packs/day for 15 years    Types: Cigarettes    Start date: 06/22/1968    Quit date: 05/08/2014  . Smokeless tobacco: Not on file  . Alcohol Use: No  . Drug Use: No  . Sexual Activity: Yes   Other Topics Concern  . Not on file   Social History Narrative     Filed Vitals:   05/22/14 1109  BP: 140/68  Pulse: 49  Height: 5'  8" (1.727 m)  Weight: 147 lb 9.6 oz (66.951 kg)   HR by auscultation: 70 bpm range  PHYSICAL EXAM General: NAD HEENT: Normal. Neck: No JVD, no thyromegaly. Lungs: Clear to auscultation bilaterally with normal respiratory effort. CV: Laterally displaced PMI.  Regular rate and rhythm, normal S1/S2, +S3, 3/6 ejection systolic murmur over RUSB, 3/6 apical holosystolic murmur, 2/4 holodiastolic murmur along left sternal border. No pretibial or periankle edema.  No carotid bruit.   Abdomen: Soft, nontender, no hepatosplenomegaly, no distention.  Neurologic: Alert and oriented x 3.  Psych: Normal affect. Skin: Normal. Musculoskeletal: Normal range of motion, no gross  deformities. Extremities: No clubbing or cyanosis.   ECG: Most recent ECG reviewed.      ASSESSMENT AND PLAN: 1. Chest pain and shortness of breath in the context of severe LV dysfunction, severe LV dilatation, severe aortic stenosis, moderate aortic regurgitation, and moderate to severe mitral regurgitation: I had a long discussion with the patient and his family regarding prognosis and further management plans. Will initiate Coreg 3.125 mg bid cautiously. Will not start ACEI at this time. He is allergic to ASA. I will arrange for right and left heart catheterization with coronary angiography on 05/23/14, as he will need aortic valve replacement and mitral valve repair, and may need CABG as well. He should follow up with CT surgery after cardiac catheterization. He will need preoperative TEE. He does not require diuretics at this time. 2. Essential HTN: Borderline today. Will start low-dose Coreg given severe aortic stenosis. Will need monitoring to see if he can tolerate this. 3. Tobacco abuse disorder  Dispo: cardiac cath tomorrow, 12/10, with f/u afterwards with CT surgery for further planning. Will need preop TEE. Subsequent f/u with Dr. McDowell.  Time spent: 40 minutes, of which >50% reviewing echo findings with patient and family and explanation of further management.  Samyuktha Brau, M.D., F.A.C.C.  

## 2014-05-23 NOTE — CV Procedure (Signed)
       PROCEDURE:  Right heart catheterization with selective coronary angiography.  INDICATIONS:  Severe aortic stenosis  The risks, benefits, and details of the procedure were explained to the patient.  The patient verbalized understanding and wanted to proceed.  Informed written consent was obtained.  PROCEDURE TECHNIQUE:  After Xylocaine anesthesia a 60F slender sheath was placed in the right radial artery with a single anterior needle wall stick. A 5 French brachial sheath was exchanged for an antecubital IV after lidocaine anesthesia. A 5 [place] PA catheter was advanced to the pulmonary artery under fluoroscopic guidance. Saturations and pressure assessment were performed. IV Heparin was given.  Right coronary angiography was done using a Judkins R4 guide catheter.  Left coronary angiography was done using a Judkins L3.5 guide catheter.  Left ventriculography was not done.  A TR band was used for hemostasis.   Manual compression will be used for the brachial sheath.   CONTRAST:  Total of 75 cc.  COMPLICATIONS:  None.    HEMODYNAMICS:  Aortic pressure was 120/59; LV pressure was not assessed; LVEDP not assessed.  Right atrial pressure 8/8; mean right atrial pressure 7 mmHg; RV pressure 46/12, RVEDP 17 mmHg; PA pressure 44/24, mean PA pressure 33 mmHg; pulmonary capillary wedge pressure 2418, mean pulmonary A wedge pressure 20 mmHg.  Hemodynamic assessment was somewhat difficult due to frequent PVCs. PA saturation 64%. Aortic saturation 90%. Cardiac output 4.5 L/m. Cardiac index 2.5.  ANGIOGRAPHIC DATA:   The left main coronary artery is widely patent.  The left anterior descending artery is a large vessel which wraps around the apex. There is a large first diagonal. The entire LAD system is widely patent with only mild atherosclerosis. This is most pronounced just at the origin of the first diagonal where there is an approximately 25% lesion in the LAD.  The left circumflex artery is a  large vessel. There are 3 large obtuse marginal vessels. The entire circumflex system appears widely patent.  The right coronary artery is a medium-sized vessel. This was best engaged with an AL-1 catheter due to an anterior takeoff. There is mild plaque in the mid vessel. There is no significant atherosclerosis. The posterior descending artery is medium size but patent. There is no significant posterior lateral artery.  LEFT VENTRICULOGRAM:  Left ventricular angiogram was not done.  IMPRESSIONS:  1. Normal left main coronary artery. 2. Mild disease in the left anterior descending artery and its branches. 3. Widely patent   left circumflex artery and its branches. 4. Widely patent right coronary artery. 5. Left ventricular systolic function not assessed.  Mild pulmonary hypertension.   PA saturation 64%. Aortic saturation 90%. Cardiac output 4.5 L/m. Cardiac index 2.5.  RECOMMENDATION:  Proceed with workup for valve surgery. Followup with Dr. Diona BrownerMcDowell.

## 2014-05-23 NOTE — Interval H&P Note (Signed)
History and Physical Interval Note:  05/23/2014 12:10 PM  Russell [name]  has presented today for surgery, with the diagnosis of as  The various methods of treatment have been discussed with the patient and family. After consideration of risks, benefits and other options for treatment, the patient has consented to  Procedure(s): LEFT AND RIGHT HEART CATHETERIZATION WITH CORONARY ANGIOGRAM (N/A) as a surgical intervention .  The patient's history has been reviewed, patient examined, no change in status, stable for surgery.  I have reviewed the patient's chart and labs.  Questions were answered to the patient's satisfaction.     Macsen Nuttall S.  Ischemic Symptoms? CCS III (Marked limitation of ordinary activity) Anti-ischemic Medical Therapy? Minimal Therapy (1 class of medications) Non-invasive Test Results? High-risk stress test findings: cardiac mortality >3%/yr Prior CABG? No Previous CABG   Patient Information:   1-2V CAD, no prox LAD  A (8)  Indication: 18; Score: 8   Patient Information:   CTO of 1 vessel, no other CAD  A (7)  Indication: 28; Score: 7   Patient Information:   1V CAD with prox LAD  A (9)  Indication: 34; Score: 9   Patient Information:   2V-CAD with prox LAD  A (9)  Indication: 40; Score: 9   Patient Information:   3V-CAD without LMCA  A (9)  Indication: 46; Score: 9   Patient Information:   3V-CAD without LMCA With Abnormal LV systolic function  A (9)  Indication: 48; Score: 9   Patient Information:   LMCA-CAD  A (9)  Indication: 49; Score: 9   Patient Information:   2V-CAD with prox LAD PCI  A (7)  Indication: 62; Score: 7   Patient Information:   2V-CAD with prox LAD CABG  A (8)  Indication: 62; Score: 8   Patient Information:   3V-CAD without LMCA With Low CAD burden(i.e., 3 focal stenoses, low SYNTAX score) PCI  A (7)  Indication: 63; Score: 7   Patient Information:   3V-CAD without  LMCA With Low CAD burden(i.e., 3 focal stenoses, low SYNTAX score) CABG  A (9)  Indication: 63; Score: 9   Patient Information:   3V-CAD without LMCA E06c - Intermediate-high CAD burden (i.e., multiple diffuse lesions, presence of CTO, or high SYNTAX score) PCI  U (4)  Indication: 64; Score: 4   Patient Information:   3V-CAD without LMCA E06c - Intermediate-high CAD burden (i.e., multiple diffuse lesions, presence of CTO, or high SYNTAX score) CABG  A (9)  Indication: 64; Score: 9   Patient Information:   LMCA-CAD With Isolated LMCA stenosis  PCI  U (6)  Indication: 65; Score: 6   Patient Information:   LMCA-CAD With Isolated LMCA stenosis  CABG  A (9)  Indication: 65; Score: 9   Patient Information:   LMCA-CAD Additional CAD, low CAD burden (i.e., 1- to 2-vessel additional involvement, low SYNTAX score) PCI  U (5)  Indication: 66; Score: 5   Patient Information:   LMCA-CAD Additional CAD, low CAD burden (i.e., 1- to 2-vessel additional involvement, low SYNTAX score) CABG  A (9)  Indication: 66; Score: 9   Patient Information:   LMCA-CAD Additional CAD, intermediate-high CAD burden (i.e., 3-vessel involvement, presence of CTO, or high SYNTAX score) PCI  I (3)  Indication: 67; Score: 3   Patient Information:   LMCA-CAD Additional CAD, intermediate-high CAD burden (i.e., 3-vessel involvement, presence of CTO, or high SYNTAX score) CABG  A (9)  Indication: 67; Score: 9  Diagnostic cath for severe AS.  Will need surgery.  Patient aware.  Will not plan to cross aortic valve.

## 2014-05-23 NOTE — Discharge Instructions (Signed)
Radial Site Care °Refer to this sheet in the next few weeks. These instructions provide you with information on caring for yourself after your procedure. Your caregiver may also give you more specific instructions. Your treatment has been planned according to current medical practices, but problems sometimes occur. Call your caregiver if you have any problems or questions after your procedure. °HOME CARE INSTRUCTIONS °· You may shower the day after the procedure. Remove the bandage (dressing) and gently wash the site with plain soap and water. Gently pat the site dry. °· Do not apply powder or lotion to the site. °· Do not submerge the affected site in water for 3 to 5 days. °· Inspect the site at least twice daily. °· Do not flex or bend the affected arm for 24 hours. °· No lifting over 5 pounds (2.3 kg) for 5 days after your procedure. °· Do not drive home if you are discharged the same day of the procedure. Have someone else drive you. °· You may drive 24 hours after the procedure unless otherwise instructed by your caregiver. °· Do not operate machinery or power tools for 24 hours. °· A responsible adult should be with you for the first 24 hours after you arrive home. °What to expect: °· Any bruising will usually fade within 1 to 2 weeks. °· Blood that collects in the tissue (hematoma) may be painful to the touch. It should usually decrease in size and tenderness within 1 to 2 weeks. °SEEK IMMEDIATE MEDICAL CARE IF: °· You have unusual pain at the radial site. °· You have redness, warmth, swelling, or pain at the radial site. °· You have drainage (other than a small amount of blood on the dressing). °· You have chills. °· You have a fever or persistent symptoms for more than 72 hours. °· You have a fever and your symptoms suddenly get worse. °· Your arm becomes pale, cool, tingly, or numb. °· You have heavy bleeding from the site. Hold pressure on the site. °Document Released: 07/03/2010 Document Revised:  08/23/2011 Document Reviewed: 07/03/2010 °[employer]® Patient Information ©2015 [employer]. This information is not intended to replace advice given to you by your health care provider. Make sure you discuss any questions you have with your health care provider. ° °

## 2014-05-24 ENCOUNTER — Encounter (HOSPITAL_COMMUNITY): Payer: Self-pay | Admitting: Interventional Cardiology

## 2014-05-28 ENCOUNTER — Institutional Professional Consult (permissible substitution) (INDEPENDENT_AMBULATORY_CARE_PROVIDER_SITE_OTHER): Payer: 59 | Admitting: Surgery

## 2014-05-28 ENCOUNTER — Encounter: Payer: Self-pay | Admitting: Surgery

## 2014-05-28 VITALS — BP 117/66 | HR 75 | Resp 20 | Ht 68.0 in | Wt 147.0 lb

## 2014-05-28 DIAGNOSIS — I350 Nonrheumatic aortic (valve) stenosis: Secondary | ICD-10-CM

## 2014-05-28 NOTE — Progress Notes (Signed)
Cardiothoracic Surgery Consultation  PCP is Russell Obey, MD Referring Provider is Russell Linden, MD  Chief Complaint  Patient presents with  . Aortic Stenosis    surgical eval for possible AVR, Cardiac Cath 05/23/14, CTA Chest  05/16/14    HPI:  The patient is a 58 year old gentleman who has noticed a 2-3 month history of progressive exertional fatigue and shortness of breath and was recently hospitalized for chest pain. He ruled out for MI. An echo at [facility] showed severe AS with a mean gradient of 40 mm Hg and moderate AI. There was moderate to severe MR and severe LA dilatation. LVEF was 40%. There was marked LV dilatation with a LVIDd of 6.4 cm and a LVIDs of 5.85 cm. He had a cardiac cath on 05/23/2014 showing no significant coronary disease with a CI of 2.5 and PAP of 44/24. PA sat was 64%.He is having shortness of breath with minimal exertion.  Past Medical History  Diagnosis Date  . Essential hypertension   . Anxiety   . Back pain   . Cervical disc disease     C6-7    Past Surgical History  Procedure Laterality Date  . Back surgery    . Right knee surgery      Cartilage removed  . Colonoscopy  10/19/2011    Procedure: COLONOSCOPY;  Surgeon: Russell Heading, MD;  Location: [facility];  Service: Gastroenterology;  Laterality: N/A;  . Left and right heart catheterization with coronary angiogram N/A 05/23/2014    Procedure: LEFT AND RIGHT HEART CATHETERIZATION WITH CORONARY ANGIOGRAM;  Surgeon: Russell Crafts, MD;  Location: [facility] CATH LAB;  Service: Cardiovascular;  Laterality: N/A;    Family History  Problem Relation Age of Onset  . Atrial fibrillation Mother     Social History History  Substance Use Topics  . Smoking status: Former Smoker -- 1.50 packs/day for 15 years    Types: Cigarettes    Start date: 06/22/1968    Quit date: 05/08/2014  . Smokeless tobacco: Not on file  . Alcohol Use: No    Current Outpatient Prescriptions    Medication Sig Dispense Refill  . ALPRAZolam (XANAX) 0.5 MG tablet Take 1 tablet (0.5 mg total) by mouth 2 (two) times daily as needed for anxiety or sleep. 30 tablet 0  . carvedilol (COREG) 3.125 MG tablet Take 1 tablet (3.125 mg total) by mouth 2 (two) times daily. 180 tablet 3  . Cyanocobalamin (VITAMIN B-12 PO) Take 1 tablet by mouth daily.    . hydrochlorothiazide (HYDRODIURIL) 25 MG tablet Take 25 mg by mouth daily.    Marland Kitchen HYDROcodone-acetaminophen (NORCO/VICODIN) 5-325 MG per tablet Take 1 tablet by mouth every 6 (six) hours as needed for moderate pain. 15 tablet 0  . Multiple Vitamin (MULTI-VITAMIN PO) Take 1 tablet by mouth daily.    . predniSONE (DELTASONE) 10 MG tablet Take 20 mg by mouth 2 (two) times daily with a meal.      No current facility-administered medications for this visit.    Allergies  Allergen Reactions  . Asa [Aspirin] Diarrhea and Nausea And Vomiting  . Prozac [Fluoxetine Hcl] Other (See Comments)    confusion    Review of Systems  Constitutional: Positive for activity change and fatigue. Negative for fever, chills, diaphoresis, appetite change and unexpected weight change.  Eyes: Negative.   Respiratory: Positive for apnea.   Cardiovascular: Positive for chest pain and leg swelling. Negative for palpitations.  Gastrointestinal:  Frequent heartburn  Endocrine: Negative.   Genitourinary: Negative.   Musculoskeletal: Positive for myalgias.  Skin: Negative.   Allergic/Immunologic: Negative.   Neurological: Negative.   Hematological: Negative.   Psychiatric/Behavioral: The patient is nervous/anxious.     BP 117/66 mmHg  Pulse 75  Resp 20  Ht 5\' 8"  (1.727 m)  Wt 147 lb (66.679 kg)  BMI 22.36 kg/m2  SpO2 97% Physical Exam  Constitutional: He is oriented to person, place, and time. He appears well-nourished. No distress.  HENT:  Head: Atraumatic.  Mouth/Throat: Oropharynx is clear and moist.  Teeth in good condition  Eyes: EOM are normal.  Pupils are equal, round, and reactive to light.  Neck: Normal range of motion. Neck supple. No JVD present. No thyromegaly present.  Cardiovascular: Normal rate, regular rhythm and intact distal pulses.   3/6 harsh systolic murmur at RSB and 2/6 diastolic murmur at apex.  Pulmonary/Chest: Effort normal. No respiratory distress. He has no rales.  Abdominal: Soft. Bowel sounds are normal. He exhibits no distension and no mass. There is no tenderness.  Musculoskeletal: Normal range of motion. He exhibits no edema.  Neurological: He is alert and oriented to person, place, and time. He has normal strength. No cranial nerve deficit or sensory deficit.  Skin: Skin is warm and dry.  Psychiatric: He has a normal mood and affect.     Diagnostic Tests:   CLINICAL DATA: Dyspnea, chest pain with deep inspiration for 2 days  EXAM: CT ANGIOGRAPHY CHEST WITH CONTRAST  TECHNIQUE: Multidetector CT imaging of the chest was performed using the standard protocol during bolus administration of intravenous contrast. Multiplanar CT image reconstructions and MIPs were obtained to evaluate the vascular anatomy.  CONTRAST: 100mL OMNIPAQUE IOHEXOL 350 MG/ML SOLN  COMPARISON: None.  FINDINGS: Sagittal images of the spine are unremarkable. Images of the thoracic inlet are unremarkable.  No mediastinal hematoma. Sub- carinal lymph node measures 1.4 x 1.5 cm. Right hilar lymph node measures 1.8 x 1.4 cm. Left hilar lymph node measures 1.6 x 1.2 cm.  AP window lymph node measures 1.3 x 1.4 cm. The study is of excellent technical quality. No pulmonary embolus is noted. Central mild bronchitic changes.  There is hyperinflation. Bilateral emphysematous changes are noted. Small bilateral pleural effusion. There is no segmental infiltrate. There is mild interstitial prominence bilaterally suspicious for mild interstitial edema.  In axial image 59 there is non calcified nodule in left lower  lobe posteriorly measures 7.5 x 7 mm. A second nodule in left lower lobe just posterior to the fissure measures 5 mm.  Paraseptal emphysematous bullae are noted bilateral apex. There is paraseptal emphysematous bulla in right lower lobe pericardiac.  The visualized upper abdomen shows no adrenal gland mass. There is a cyst in upper pole of the right kidney measures 4.2 cm.  Review of the MIP images confirms the above findings.  IMPRESSION: 1. No pulmonary embolus is noted. There is mild mediastinal and bilateral hilar adenopathy. Attention on follow-up CT scan should be given. 2. Bilateral emphysematous changes. Hyperinflation. Bilateral small pleural effusion right greater than left. Central mild bronchitic changes. There is mild interstitial prominence bilaterally suspicious for mild interstitial edema superimposed on chronic interstitial lung disease. 3. There is a noncalcified nodule in left lower lobe posteriorly measures 7.5 by 7 mm. Second nodule in right lower lobe just posterior to fissure measures 5 mm. If the patient is at high risk for bronchogenic carcinoma, follow-up chest CT at 3-826months is recommended. If the patient  is at low risk for bronchogenic carcinoma, follow-up chest CT at 6-12 months is recommended. This recommendation follows the consensus statement: Guidelines for Management of Small Pulmonary Nodules Detected on CT Scans: A Statement from the Fleischner Society as published in Radiology 2005; 237:395-400. 4. Cyst in upper pole of the right kidney measures 4.2 cm.   Electronically Signed  By: Natasha Mead M.D.  On: 05/16/2014 12:02   PROCEDURE: Right heart catheterization with selective coronary angiography.  INDICATIONS: Severe aortic stenosis  The risks, benefits, and details of the procedure were explained to the patient. The patient verbalized understanding and wanted to proceed. Informed written consent was obtained.  PROCEDURE  TECHNIQUE: After Xylocaine anesthesia a 38F slender sheath was placed in the right radial artery with a single anterior needle wall stick. A 5 French brachial sheath was exchanged for an antecubital IV after lidocaine anesthesia. A 5 [place] PA catheter was advanced to the pulmonary artery under fluoroscopic guidance. Saturations and pressure assessment were performed. IV Heparin was given. Right coronary angiography was done using a Judkins R4 guide catheter. Left coronary angiography was done using a Judkins L3.5 guide catheter. Left ventriculography was not done. A TR band was used for hemostasis. Manual compression will be used for the brachial sheath.  CONTRAST: Total of 75 cc.  COMPLICATIONS: None.   HEMODYNAMICS: Aortic pressure was 120/59; LV pressure was not assessed; LVEDP not assessed. Right atrial pressure 8/8; mean right atrial pressure 7 mmHg; RV pressure 46/12, RVEDP 17 mmHg; PA pressure 44/24, mean PA pressure 33 mmHg; pulmonary capillary wedge pressure 2418, mean pulmonary A wedge pressure 20 mmHg. Hemodynamic assessment was somewhat difficult due to frequent PVCs. PA saturation 64%. Aortic saturation 90%. Cardiac output 4.5 L/m. Cardiac index 2.5.  ANGIOGRAPHIC DATA: The left main coronary artery is widely patent.  The left anterior descending artery is a large vessel which wraps around the apex. There is a large first diagonal. The entire LAD system is widely patent with only mild atherosclerosis. This is most pronounced just at the origin of the first diagonal where there is an approximately 25% lesion in the LAD.  The left circumflex artery is a large vessel. There are 3 large obtuse marginal vessels. The entire circumflex system appears widely patent.  The right coronary artery is a medium-sized vessel. This was best engaged with an AL-1 catheter due to an anterior takeoff. There is mild plaque in the mid vessel. There is no significant atherosclerosis. The  posterior descending artery is medium size but patent. There is no significant posterior lateral artery.  LEFT VENTRICULOGRAM: Left ventricular angiogram was not done.  IMPRESSIONS:  1. Normal left main coronary artery. 2. Mild disease in the left anterior descending artery and its branches. 3. Widely patent left circumflex artery and its branches. 4. Widely patent right coronary artery. 5. Left ventricular systolic function not assessed. Mild pulmonary hypertension. PA saturation 64%. Aortic saturation 90%. Cardiac output 4.5 L/m. Cardiac index 2.5.  RECOMMENDATION: Proceed with workup for valve surgery. Followup with Dr. Diona Browner.       Impression:  He has severe aortic stenosis and moderate AI with moderate to severe MR and severe LV dysfunction with an EF of 30-40% with a dilated LV. He has become progressively symptomatic over the past few months and was very active and asymptomatic prior to that. I think AVR and MV repair is indicated to improve his life style-limiting symptoms and prevent further LV deterioration, congestive heart failure, and death.  I discussed the  operative procedure with the patient and his wife and brother including alternatives, benefits and risks; including but not limited to bleeding, blood transfusion, infection, stroke, myocardial infarction, graft failure, heart block requiring a permanent pacemaker, organ dysfunction, and death. We discussed the pros and cons of mechanical and tissue valves and he would like to use a tissue valve so that he does not have to take coumadin long-term. He understands that he will still need to be on coumadin for at least 3 months postop with a mitral valve repair.  Russell [name] understands and agrees to proceed.  We will schedule surgery for Monday 06/17/2014                                                                                          Plan:  AVR using a tissue valve and MV repair on 06/17/2014

## 2014-05-29 ENCOUNTER — Other Ambulatory Visit: Payer: Self-pay | Admitting: *Deleted

## 2014-05-29 DIAGNOSIS — I340 Nonrheumatic mitral (valve) insufficiency: Secondary | ICD-10-CM

## 2014-05-29 DIAGNOSIS — I350 Nonrheumatic aortic (valve) stenosis: Secondary | ICD-10-CM

## 2014-05-30 ENCOUNTER — Ambulatory Visit: Payer: 59 | Admitting: Cardiology

## 2014-06-12 ENCOUNTER — Encounter: Payer: Self-pay | Admitting: *Deleted

## 2014-06-13 ENCOUNTER — Ambulatory Visit (HOSPITAL_COMMUNITY)
Admission: RE | Admit: 2014-06-13 | Discharge: 2014-06-13 | Disposition: A | Discharge status: Home / Self Care | Payer: BC Managed Care – PPO | Source: Ambulatory Visit | Attending: Surgery | Admitting: Surgery

## 2014-06-13 ENCOUNTER — Encounter (HOSPITAL_COMMUNITY): Payer: Self-pay

## 2014-06-13 ENCOUNTER — Encounter (HOSPITAL_COMMUNITY)
Admission: RE | Admit: 2014-06-13 | Discharge: 2014-06-13 | Disposition: A | Discharge status: Home / Self Care | Payer: BC Managed Care – PPO | Source: Ambulatory Visit | Attending: Surgery | Admitting: Surgery

## 2014-06-13 VITALS — BP 130/73 | HR 84 | Temp 97.8°F | Resp 20 | Ht 68.0 in | Wt 145.9 lb

## 2014-06-13 DIAGNOSIS — I340 Nonrheumatic mitral (valve) insufficiency: Secondary | ICD-10-CM

## 2014-06-13 DIAGNOSIS — I359 Nonrheumatic aortic valve disorder, unspecified: Secondary | ICD-10-CM | POA: Diagnosis not present

## 2014-06-13 DIAGNOSIS — I350 Nonrheumatic aortic (valve) stenosis: Secondary | ICD-10-CM

## 2014-06-13 DIAGNOSIS — Z01810 Encounter for preprocedural cardiovascular examination: Secondary | ICD-10-CM

## 2014-06-13 HISTORY — DX: Other nonspecific abnormal finding of lung field: R91.8

## 2014-06-13 HISTORY — DX: Sleep apnea, unspecified: G47.30

## 2014-06-13 HISTORY — DX: Chronic obstructive pulmonary disease, unspecified: J44.9

## 2014-06-13 HISTORY — DX: Reserved for inherently not codable concepts without codable children: IMO0001

## 2014-06-13 HISTORY — DX: Cardiac murmur, unspecified: R01.1

## 2014-06-13 HISTORY — DX: Unspecified osteoarthritis, unspecified site: M19.90

## 2014-06-13 HISTORY — DX: Chronic kidney disease, unspecified: N18.9

## 2014-06-13 LAB — BLOOD GAS, ARTERIAL
Acid-base deficit: 0.3 mmol/L (ref 0.0–2.0)
Bicarbonate: 23.4 mEq/L (ref 20.0–24.0)
Drawn by: 206361
FIO2: 0.21 %
O2 Saturation: 96.5 %
PCO2 ART: 35.3 mmHg (ref 35.0–45.0)
PO2 ART: 81.1 mmHg (ref 80.0–100.0)
Patient temperature: 98.6
TCO2: 24.5 mmol/L (ref 0–100)
pH, Arterial: 7.437 (ref 7.350–7.450)

## 2014-06-13 LAB — COMPREHENSIVE METABOLIC PANEL
ALK PHOS: 55 U/L (ref 39–117)
ALT: 17 U/L (ref 0–53)
ANION GAP: 9 (ref 5–15)
AST: 22 U/L (ref 0–37)
Albumin: 4.5 g/dL (ref 3.5–5.2)
BUN: 12 mg/dL (ref 6–23)
CHLORIDE: 104 meq/L (ref 96–112)
CO2: 23 mmol/L (ref 19–32)
Calcium: 10.9 mg/dL — ABNORMAL HIGH (ref 8.4–10.5)
Creatinine, Ser: 0.82 mg/dL (ref 0.50–1.35)
GFR calc Af Amer: >90 mL/min (ref >90)
GFR calc non Af Amer: >90 mL/min (ref >90)
Glucose, Bld: 115 mg/dL — ABNORMAL HIGH (ref 70–99)
Potassium: 3.9 mmol/L (ref 3.5–5.1)
Sodium: 136 mmol/L (ref 135–145)
TOTAL PROTEIN: 7.4 g/dL (ref 6.0–8.3)
Total Bilirubin: 0.7 mg/dL (ref 0.3–1.2)

## 2014-06-13 LAB — URINALYSIS, ROUTINE W REFLEX MICROSCOPIC
BILIRUBIN URINE: NEGATIVE
Glucose, UA: NEGATIVE mg/dL
HGB URINE DIPSTICK: NEGATIVE
Ketones, ur: NEGATIVE mg/dL
Leukocytes, UA: NEGATIVE
Nitrite: NEGATIVE
PROTEIN: NEGATIVE mg/dL
SPECIFIC GRAVITY, URINE: 1.009 (ref 1.005–1.030)
Urobilinogen, UA: 0.2 mg/dL (ref 0.0–1.0)
pH: 7 (ref 5.0–8.0)

## 2014-06-13 LAB — PULMONARY FUNCTION TEST
DL/VA % PRED: 95 %
DL/VA: 4.14 ml/min/mmHg/L
DLCO UNC: 20.29 ml/min/mmHg
DLCO cor % pred: 75 %
DLCO cor: 20.29 ml/min/mmHg
DLCO unc % pred: 75 %
FEF 25-75 Post: 1.67 L/sec
FEF 25-75 Pre: 1.26 L/sec
FEF2575-%Change-Post: 33 %
FEF2575-%Pred-Post: 64 %
FEF2575-%Pred-Pre: 48 %
FEV1-%Change-Post: 4 %
FEV1-%PRED-POST: 77 %
FEV1-%Pred-Pre: 73 %
FEV1-Post: 2.4 L
FEV1-Pre: 2.29 L
FEV1FVC-%Change-Post: 0 %
FEV1FVC-%Pred-Pre: 89 %
FEV6-%Change-Post: 3 %
FEV6-%PRED-PRE: 84 %
FEV6-%Pred-Post: 87 %
FEV6-POST: 3.42 L
FEV6-PRE: 3.3 L
FEV6FVC-%CHANGE-POST: 0 %
FEV6FVC-%PRED-POST: 102 %
FEV6FVC-%PRED-PRE: 103 %
FVC-%Change-Post: 4 %
FVC-%PRED-POST: 85 %
FVC-%PRED-PRE: 81 %
FVC-PRE: 3.37 L
FVC-Post: 3.51 L
Post FEV1/FVC ratio: 68 %
Post FEV6/FVC ratio: 97 %
Pre FEV1/FVC ratio: 68 %
Pre FEV6/FVC Ratio: 98 %
RV % PRED: 112 %
RV: 2.29 L
TLC % pred: 96 %
TLC: 5.99 L

## 2014-06-13 LAB — CBC
HEMATOCRIT: 43.4 % (ref 39.0–52.0)
Hemoglobin: 14.7 g/dL (ref 13.0–17.0)
MCH: 29.9 pg (ref 26.0–34.0)
MCHC: 33.9 g/dL (ref 30.0–36.0)
MCV: 88.4 fL (ref 78.0–100.0)
PLATELETS: 212 10*3/uL (ref 150–400)
RBC: 4.91 MIL/uL (ref 4.22–5.81)
RDW: 12.8 % (ref 11.5–15.5)
WBC: 10.4 10*3/uL (ref 4.0–10.5)

## 2014-06-13 LAB — PROTIME-INR
INR: 0.97 (ref 0.00–1.49)
Prothrombin Time: 13 seconds (ref 11.6–15.2)

## 2014-06-13 LAB — APTT: APTT: 39 s — AB (ref 24–37)

## 2014-06-13 LAB — SURGICAL PCR SCREEN
MRSA, PCR: NEGATIVE
Staphylococcus aureus: NEGATIVE

## 2014-06-13 LAB — ABO/RH: ABO/RH(D): O POS

## 2014-06-13 MED ORDER — ALBUTEROL SULFATE (2.5 MG/3ML) 0.083% IN NEBU
2.5000 mg | INHALATION_SOLUTION | Freq: Once | RESPIRATORY_TRACT | Status: AC
  Administered 2014-06-13: 2.5 mg via RESPIRATORY_TRACT

## 2014-06-13 MED ORDER — CHLORHEXIDINE GLUCONATE 4 % EX LIQD: 30.0000 mL | CUTANEOUS | Status: DC

## 2014-06-13 NOTE — Pre-Procedure Instructions (Signed)
Russell [name]  06/13/2014   Your procedure is scheduled on:  Monday June 17, 2014 at 0730 AM  [facility] at 0530 AM.  Call this number if you have problems the morning of surgery: [phone]   Remember:   Do not eat food or drink liquids after midnight Sunday 06/16/14.   Take these medicines the morning of surgery with A SIP OF WATER: Xanax if needed for anxiety, Coreg, and Hydrocodone if needed for pain.  Stop Nsaids (Ibuprofen, Motrin, Advil, Aleve and Naproxen) now.    Do not wear jewelry.  Do not wear lotions, powders, or colognes. You may wear deodorant.   Men may shave face and neck.  Do not bring valuables to the hospital.  [facility] is not responsible                  for any belongings or valuables.               Contacts, dentures or bridgework may not be worn into surgery.  Leave suitcase in the car. After surgery it may be brought to your room.  For patients admitted to the hospital, discharge time is determined by your                treatment team.               Patients discharged the day of surgery will not be allowed to drive  home.    Special Instructions: [place] - Preparing for Surgery  Before surgery, you can play an important role.  Because skin is not sterile, your skin needs to be as free of germs as possible.  You can reduce the number of germs on you skin by washing with CHG (chlorahexidine gluconate) soap before surgery.  CHG is an antiseptic cleaner which kills germs and bonds with the skin to continue killing germs even after washing.  Please DO NOT use if you have an allergy to CHG or antibacterial soaps.  If your skin becomes reddened/irritated stop using the CHG and inform your nurse when you arrive at Short Stay.  Do not shave (including legs and underarms) for at least 48 hours prior to the first CHG shower.  You may shave your face.  Please follow these instructions carefully:   1.  Shower with CHG  Soap the night before surgery and the                                morning of Surgery.  2.  If you choose to wash your hair, wash your hair first as usual with your       normal shampoo.  3.  After you shampoo, rinse your hair and body thoroughly to remove the                      Shampoo.  4.  Use CHG as you would any other liquid soap.  You can apply chg directly       to the skin and wash gently with scrungie or a clean washcloth.  5.  Apply the CHG Soap to your body ONLY FROM THE NECK DOWN.        Do not use on open wounds or open sores.  Avoid contact with your eyes,       ears, mouth and genitals (private parts).  Wash genitals (  private parts)       with your normal soap.  6.  Wash thoroughly, paying special attention to the area where your surgery        will be performed.  7.  Thoroughly rinse your body with warm water from the neck down.  8.  DO NOT shower/wash with your normal soap after using and rinsing off       the CHG Soap.  9.  Pat yourself dry with a clean towel.            10.  Wear clean pajamas.            11.  Place clean sheets on your bed the night of your first shower and do not        sleep with pets.  Day of Surgery  Do not apply any lotions/deoderants the morning of surgery.  Please wear clean clothes to the hospital/surgery center.      Please read over the following fact sheets that you were given: Pain Booklet, Coughing and Deep Breathing, Blood Transfusion Information, MRSA Information and Surgical Site Infection Prevention

## 2014-06-13 NOTE — Progress Notes (Signed)
VASCULAR LAB PRELIMINARY  PRELIMINARY  PRELIMINARY  PRELIMINARY  Pre-op Cardiac Surgery  Carotid Findings:  Bilateral:  1-39% ICA stenosis.  Vertebral artery flow is antegrade.      Upper Extremity Right Left  Brachial Pressures 130  Triphasic  116  Triphasic   Radial Waveforms Triphasic  Triphasic   Ulnar Waveforms Triphasic  Triphasic   Palmar Arch (Allen's Test) Doppler obliterates with radial compression, normal with ulnar compression. Doppler normal with radial compression, obliterates with ulnar compression    Siara Gorder, RVT 06/13/2014, 3:39 PM

## 2014-06-16 LAB — HEMOGLOBIN A1C
Hgb A1c MFr Bld: 6 % — ABNORMAL HIGH (ref <5.7)
Mean Plasma Glucose: 126 mg/dL — ABNORMAL HIGH (ref <117)

## 2014-06-16 MED ORDER — EPINEPHRINE HCL 1 MG/ML IJ SOLN
0.0000 ug/min | INTRAMUSCULAR | Status: DC
  Filled 2014-06-16: qty 4

## 2014-06-16 MED ORDER — SODIUM CHLORIDE 0.9 % IV SOLN
INTRAVENOUS | Status: DC
  Filled 2014-06-16: qty 30

## 2014-06-16 MED ORDER — METOPROLOL TARTRATE 12.5 MG HALF TABLET: 12.5000 mg | ORAL_TABLET | Freq: Once | ORAL | Status: DC

## 2014-06-16 MED ORDER — POTASSIUM CHLORIDE 2 MEQ/ML IV SOLN
80.0000 meq | INTRAVENOUS | Status: DC
  Filled 2014-06-16: qty 40

## 2014-06-16 MED ORDER — DEXTROSE 5 % IV SOLN
1.5000 g | INTRAVENOUS | Status: AC
  Administered 2014-06-17: .75 g via INTRAVENOUS
  Administered 2014-06-17: 1.5 g via INTRAVENOUS
  Filled 2014-06-16: qty 1.5

## 2014-06-16 MED ORDER — DOPAMINE-DEXTROSE 3.2-5 MG/ML-% IV SOLN
0.0000 ug/kg/min | INTRAVENOUS | Status: AC
  Administered 2014-06-17: 3 ug/kg/min via INTRAVENOUS
  Filled 2014-06-16: qty 250

## 2014-06-16 MED ORDER — DEXTROSE 5 % IV SOLN
750.0000 mg | INTRAVENOUS | Status: DC
  Filled 2014-06-16 (×2): qty 750

## 2014-06-16 MED ORDER — DEXMEDETOMIDINE HCL IN NACL 400 MCG/100ML IV SOLN
0.1000 ug/kg/h | INTRAVENOUS | Status: AC
  Administered 2014-06-17: 0.2 ug/kg/h via INTRAVENOUS
  Filled 2014-06-16: qty 100

## 2014-06-16 MED ORDER — SODIUM CHLORIDE 0.9 % IV SOLN
INTRAVENOUS | Status: AC
  Administered 2014-06-17: 69.8 mL/h via INTRAVENOUS
  Filled 2014-06-16: qty 40

## 2014-06-16 MED ORDER — PLASMA-LYTE 148 IV SOLN
INTRAVENOUS | Status: DC
  Filled 2014-06-16: qty 2.5

## 2014-06-16 MED ORDER — PHENYLEPHRINE HCL 10 MG/ML IJ SOLN
30.0000 ug/min | INTRAVENOUS | Status: AC
  Administered 2014-06-17: 25 ug/min via INTRAVENOUS
  Filled 2014-06-16: qty 2

## 2014-06-16 MED ORDER — VANCOMYCIN HCL 10 G IV SOLR
1250.0000 mg | INTRAVENOUS | Status: AC
  Administered 2014-06-17: 1250 mg via INTRAVENOUS
  Filled 2014-06-16: qty 1250

## 2014-06-16 MED ORDER — MAGNESIUM SULFATE 50 % IJ SOLN
40.0000 meq | INTRAMUSCULAR | Status: DC
  Filled 2014-06-16: qty 10

## 2014-06-16 MED ORDER — NITROGLYCERIN IN D5W 200-5 MCG/ML-% IV SOLN
2.0000 ug/min | INTRAVENOUS | Status: AC
  Administered 2014-06-17: 5 ug/min via INTRAVENOUS
  Filled 2014-06-16: qty 250

## 2014-06-16 MED ORDER — SODIUM CHLORIDE 0.9 % IV SOLN
INTRAVENOUS | Status: AC
  Administered 2014-06-17: 1 [IU]/h via INTRAVENOUS
  Filled 2014-06-16: qty 2.5

## 2014-06-16 NOTE — H&P (Signed)
[address]       [place] [address]             [phone]      Cardiothoracic Surgery History and Physical   PCP is Milana Obey, MD Referring Provider is Laqueta Linden, MD  Chief Complaint  Patient presents with  . Aortic Stenosis    surgical eval for possible AVR, Cardiac Cath 05/23/14, CTA Chest 05/16/14    HPI:  The patient is a 59 year old gentleman who has noticed a 2-3 month history of progressive exertional fatigue and shortness of breath and was recently hospitalized for chest pain. He ruled out for MI. An echo at [facility] showed severe AS with a mean gradient of 40 mm Hg and moderate AI. There was moderate to severe MR and severe LA dilatation. LVEF was 40%. There was marked LV dilatation with a LVIDd of 6.4 cm and a LVIDs of 5.85 cm. He had a cardiac cath on 05/23/2014 showing no significant coronary disease with a CI of 2.5 and PAP of 44/24. PA sat was 64%.He is having shortness of breath with minimal exertion.  Past Medical History  Diagnosis Date  . Essential hypertension   . Anxiety   . Back pain   . Cervical disc disease     C6-7    Past Surgical History  Procedure Laterality Date  . Back surgery    . Right knee surgery      Cartilage removed  . Colonoscopy  10/19/2011    Procedure: COLONOSCOPY; Surgeon: Dalia Heading, MD; Location: [facility]; Service: Gastroenterology; Laterality: N/A;  . Left and right heart catheterization with coronary angiogram N/A 05/23/2014    Procedure: LEFT AND RIGHT HEART CATHETERIZATION WITH CORONARY ANGIOGRAM; Surgeon: Corky Crafts, MD; Location: [facility] CATH LAB; Service: Cardiovascular; Laterality: N/A;    Family History  Problem Relation Age of Onset  . Atrial fibrillation Mother     Social History History  Substance Use Topics  . Smoking status: Former Smoker -- 1.50 packs/day for 15  years    Types: Cigarettes    Start date: 06/22/1968    Quit date: 05/08/2014  . Smokeless tobacco: Not on file  . Alcohol Use: No    Current Outpatient Prescriptions  Medication Sig Dispense Refill  . ALPRAZolam (XANAX) 0.5 MG tablet Take 1 tablet (0.5 mg total) by mouth 2 (two) times daily as needed for anxiety or sleep. 30 tablet 0  . carvedilol (COREG) 3.125 MG tablet Take 1 tablet (3.125 mg total) by mouth 2 (two) times daily. 180 tablet 3  . Cyanocobalamin (VITAMIN B-12 PO) Take 1 tablet by mouth daily.    . hydrochlorothiazide (HYDRODIURIL) 25 MG tablet Take 25 mg by mouth daily.    Marland Kitchen HYDROcodone-acetaminophen (NORCO/VICODIN) 5-325 MG per tablet Take 1 tablet by mouth every 6 (six) hours as needed for moderate pain. 15 tablet 0  . Multiple Vitamin (MULTI-VITAMIN PO) Take 1 tablet by mouth daily.    . predniSONE (DELTASONE) 10 MG tablet Take 20 mg by mouth 2 (two) times daily with a meal.      No current facility-administered medications for this visit.    Allergies  Allergen Reactions  . Asa [Aspirin] Diarrhea and Nausea And Vomiting  . Prozac [Fluoxetine Hcl] Other (See Comments)    confusion    Review of Systems  Constitutional: Positive for activity change and fatigue. Negative for fever, chills, diaphoresis, appetite change and  unexpected weight change.  Eyes: Negative.  Respiratory: Positive for apnea.  Cardiovascular: Positive for chest pain and leg swelling. Negative for palpitations.  Gastrointestinal:   Frequent heartburn  Endocrine: Negative.  Genitourinary: Negative.  Musculoskeletal: Positive for myalgias.  Skin: Negative.  Allergic/Immunologic: Negative.  Neurological: Negative.  Hematological: Negative.  Psychiatric/Behavioral: The patient is nervous/anxious.    BP 117/66 mmHg  Pulse 75  Resp 20  Ht  (1.727 m)  Wt 147 lb (66.679 kg)  BMI 22.36 kg/m2   SpO2 97% Physical Exam  Constitutional: He is oriented to person, place, and time. He appears well-nourished. No distress.  HENT:  Head: Atraumatic.  Mouth/Throat: Oropharynx is clear and moist.  Teeth in good condition  Eyes: EOM are normal. Pupils are equal, round, and reactive to light.  Neck: Normal range of motion. Neck supple. No JVD present. No thyromegaly present.  Cardiovascular: Normal rate, regular rhythm and intact distal pulses.  3/6 harsh systolic murmur at RSB and 2/6 diastolic murmur at apex.  Pulmonary/Chest: Effort normal. No respiratory distress. He has no rales.  Abdominal: Soft. Bowel sounds are normal. He exhibits no distension and no mass. There is no tenderness.  Musculoskeletal: Normal range of motion. He exhibits no edema.  Neurological: He is alert and oriented to person, place, and time. He has normal strength. No cranial nerve deficit or sensory deficit.  Skin: Skin is warm and dry.  Psychiatric: He has a normal mood and affect.     Diagnostic Tests:   CLINICAL DATA: Dyspnea, chest pain with deep inspiration for 2 days  EXAM: CT ANGIOGRAPHY CHEST WITH CONTRAST  TECHNIQUE: Multidetector CT imaging of the chest was performed using the standard protocol during bolus administration of intravenous contrast. Multiplanar CT image reconstructions and MIPs were obtained to evaluate the vascular anatomy.  CONTRAST: OMNIPAQUE IOHEXOL 350 MG/ML SOLN  COMPARISON: None.  FINDINGS: Sagittal images of the spine are unremarkable. Images of the thoracic inlet are unremarkable.  No mediastinal hematoma. Sub- carinal lymph node measures 1.4 x 1.5 cm. Right hilar lymph node measures 1.8 x 1.4 cm. Left hilar lymph node measures 1.6 x 1.2 cm.  AP window lymph node measures 1.3 x 1.4 cm. The study is of excellent technical quality. No pulmonary embolus is noted. Central mild bronchitic changes.  There is hyperinflation. Bilateral  emphysematous changes are noted. Small bilateral pleural effusion. There is no segmental infiltrate. There is mild interstitial prominence bilaterally suspicious for mild interstitial edema.  In axial image 59 there is non calcified nodule in left lower lobe posteriorly measures 7.5 x 7 mm. A second nodule in left lower lobe just posterior to the fissure measures 5 mm.  Paraseptal emphysematous bullae are noted bilateral apex. There is paraseptal emphysematous bulla in right lower lobe pericardiac.  The visualized upper abdomen shows no adrenal gland mass. There is a cyst in upper pole of the right kidney measures 4.2 cm.  Review of the MIP images confirms the above findings.  IMPRESSION: 1. No pulmonary embolus is noted. There is mild mediastinal and bilateral hilar adenopathy. Attention on follow-up CT scan should be given. 2. Bilateral emphysematous changes. Hyperinflation. Bilateral small pleural effusion right greater than left. Central mild bronchitic changes. There is mild interstitial prominence bilaterally suspicious for mild interstitial edema superimposed on chronic interstitial lung disease. 3. There is a noncalcified nodule in left lower lobe posteriorly measures 7.5 by 7 mm. Second nodule in right lower lobe just posterior to fissure measures 5 mm. If  the patient is at high risk for bronchogenic carcinoma, follow-up chest CT at 3-10months is recommended. If the patient is at low risk for bronchogenic carcinoma, follow-up chest CT at 6-12 months is recommended. This recommendation follows the consensus statement: Guidelines for Management of Small Pulmonary Nodules Detected on CT Scans: A Statement from the Fleischner Society as published in Radiology 2005; 237:395-400. 4. Cyst in upper pole of the right kidney measures 4.2 cm.   Electronically Signed  By: Natasha Mead M.D.  On: 05/16/2014 12:02   PROCEDURE: Right heart catheterization with selective  coronary angiography.  INDICATIONS: Severe aortic stenosis  The risks, benefits, and details of the procedure were explained to the patient. The patient verbalized understanding and wanted to proceed. Informed written consent was obtained.  PROCEDURE TECHNIQUE: After Xylocaine anesthesia a 89F slender sheath was placed in the right radial artery with a single anterior needle wall stick. A 5 French brachial sheath was exchanged for an antecubital IV after lidocaine anesthesia. A 5 [place] PA catheter was advanced to the pulmonary artery under fluoroscopic guidance. Saturations and pressure assessment were performed. IV Heparin was given. Right coronary angiography was done using a Judkins R4 guide catheter. Left coronary angiography was done using a Judkins L3.5 guide catheter. Left ventriculography was not done. A TR band was used for hemostasis. Manual compression will be used for the brachial sheath.  CONTRAST: Total of 75 cc.  COMPLICATIONS: None.   HEMODYNAMICS: Aortic pressure was 120/59; LV pressure was not assessed; LVEDP not assessed. Right atrial pressure 8/8; mean right atrial pressure 7 mmHg; RV pressure 46/12, RVEDP 17 mmHg; PA pressure 44/24, mean PA pressure 33 mmHg; pulmonary capillary wedge pressure 2418, mean pulmonary A wedge pressure 20 mmHg. Hemodynamic assessment was somewhat difficult due to frequent PVCs. PA saturation 64%. Aortic saturation 90%. Cardiac output 4.5 L/m. Cardiac index 2.5.  ANGIOGRAPHIC DATA: The left main coronary artery is widely patent.  The left anterior descending artery is a large vessel which wraps around the apex. There is a large first diagonal. The entire LAD system is widely patent with only mild atherosclerosis. This is most pronounced just at the origin of the first diagonal where there is an approximately 25% lesion in the LAD.  The left circumflex artery is a large vessel. There are 3 large obtuse marginal vessels. The entire  circumflex system appears widely patent.  The right coronary artery is a medium-sized vessel. This was best engaged with an AL-1 catheter due to an anterior takeoff. There is mild plaque in the mid vessel. There is no significant atherosclerosis. The posterior descending artery is medium size but patent. There is no significant posterior lateral artery.  LEFT VENTRICULOGRAM: Left ventricular angiogram was not done.  IMPRESSIONS:  1. Normal left main coronary artery. 2. Mild disease in the left anterior descending artery and its branches. 3. Widely patent left circumflex artery and its branches. 4. Widely patent right coronary artery. 5. Left ventricular systolic function not assessed. Mild pulmonary hypertension. PA saturation 64%. Aortic saturation 90%. Cardiac output 4.5 L/m. Cardiac index 2.5.  RECOMMENDATION: Proceed with workup for valve surgery. Followup with Dr. Diona Browner.       Impression:  He has severe aortic stenosis and moderate AI with moderate to severe MR and severe LV dysfunction with an EF of 30-40% with a dilated LV. He has become progressively symptomatic over the past few months and was very active and asymptomatic prior to that. I think AVR and MV repair is indicated to  improve his life style-limiting symptoms and prevent further LV deterioration, congestive heart failure, and death. I discussed the operative procedure with the patient and his wife and brother including alternatives, benefits and risks; including but not limited to bleeding, blood transfusion, infection, stroke, myocardial infarction, graft failure, heart block requiring a permanent pacemaker, organ dysfunction, and death. We discussed the pros and cons of mechanical and tissue valves and he would like to use a tissue valve so that he does not have to take coumadin long-term. He understands that he will still need to be on coumadin for at least 3 months postop with a mitral valve repair. Durenda Guthrie understands and agrees to proceed.  Plan:  AVR using a tissue valve and MV repair on 06/17/2014

## 2014-06-17 ENCOUNTER — Inpatient Hospital Stay (HOSPITAL_COMMUNITY)
Admission: RE | Admit: 2014-06-17 | Discharge: 2014-06-24 | DRG: 220 | Disposition: A | Discharge status: Home / Self Care | Payer: BLUE CROSS/BLUE SHIELD | Source: Ambulatory Visit | Attending: Surgery | Admitting: Surgery

## 2014-06-17 ENCOUNTER — Inpatient Hospital Stay (HOSPITAL_COMMUNITY): Payer: BLUE CROSS/BLUE SHIELD

## 2014-06-17 ENCOUNTER — Inpatient Hospital Stay (HOSPITAL_COMMUNITY): Payer: BLUE CROSS/BLUE SHIELD | Admitting: Anesthesiology

## 2014-06-17 ENCOUNTER — Encounter (HOSPITAL_COMMUNITY)
Admission: RE | Disposition: A | Discharge status: Home / Self Care | Payer: BC Managed Care – PPO | Source: Ambulatory Visit | Attending: Surgery

## 2014-06-17 ENCOUNTER — Encounter (HOSPITAL_COMMUNITY): Payer: Self-pay | Admitting: *Deleted

## 2014-06-17 DIAGNOSIS — D696 Thrombocytopenia, unspecified: Secondary | ICD-10-CM | POA: Diagnosis not present

## 2014-06-17 DIAGNOSIS — Z79899 Other long term (current) drug therapy: Secondary | ICD-10-CM

## 2014-06-17 DIAGNOSIS — R0602 Shortness of breath: Secondary | ICD-10-CM | POA: Diagnosis present

## 2014-06-17 DIAGNOSIS — J90 Pleural effusion, not elsewhere classified: Secondary | ICD-10-CM | POA: Diagnosis not present

## 2014-06-17 DIAGNOSIS — F419 Anxiety disorder, unspecified: Secondary | ICD-10-CM | POA: Diagnosis present

## 2014-06-17 DIAGNOSIS — Z87891 Personal history of nicotine dependence: Secondary | ICD-10-CM

## 2014-06-17 DIAGNOSIS — I5022 Chronic systolic (congestive) heart failure: Secondary | ICD-10-CM | POA: Diagnosis present

## 2014-06-17 DIAGNOSIS — Z9889 Other specified postprocedural states: Secondary | ICD-10-CM

## 2014-06-17 DIAGNOSIS — I2510 Atherosclerotic heart disease of native coronary artery without angina pectoris: Secondary | ICD-10-CM | POA: Diagnosis present

## 2014-06-17 DIAGNOSIS — J939 Pneumothorax, unspecified: Secondary | ICD-10-CM

## 2014-06-17 DIAGNOSIS — E8770 Fluid overload, unspecified: Secondary | ICD-10-CM | POA: Diagnosis not present

## 2014-06-17 DIAGNOSIS — J9811 Atelectasis: Secondary | ICD-10-CM | POA: Diagnosis not present

## 2014-06-17 DIAGNOSIS — I10 Essential (primary) hypertension: Secondary | ICD-10-CM | POA: Diagnosis present

## 2014-06-17 DIAGNOSIS — R7309 Other abnormal glucose: Secondary | ICD-10-CM | POA: Diagnosis present

## 2014-06-17 DIAGNOSIS — I340 Nonrheumatic mitral (valve) insufficiency: Secondary | ICD-10-CM

## 2014-06-17 DIAGNOSIS — Z7952 Long term (current) use of systemic steroids: Secondary | ICD-10-CM

## 2014-06-17 DIAGNOSIS — I351 Nonrheumatic aortic (valve) insufficiency: Secondary | ICD-10-CM

## 2014-06-17 DIAGNOSIS — D62 Acute posthemorrhagic anemia: Secondary | ICD-10-CM | POA: Diagnosis not present

## 2014-06-17 DIAGNOSIS — Z952 Presence of prosthetic heart valve: Secondary | ICD-10-CM

## 2014-06-17 DIAGNOSIS — I350 Nonrheumatic aortic (valve) stenosis: Secondary | ICD-10-CM

## 2014-06-17 DIAGNOSIS — I272 Other secondary pulmonary hypertension: Secondary | ICD-10-CM | POA: Diagnosis present

## 2014-06-17 DIAGNOSIS — I080 Rheumatic disorders of both mitral and aortic valves: Principal | ICD-10-CM | POA: Diagnosis present

## 2014-06-17 HISTORY — PX: MITRAL VALVE REPAIR: SHX2039

## 2014-06-17 HISTORY — PX: AORTIC VALVE REPLACEMENT: SHX41

## 2014-06-17 HISTORY — PX: TEE WITHOUT CARDIOVERSION: SHX5443

## 2014-06-17 LAB — POCT I-STAT 3, ART BLOOD GAS (G3+)
ACID-BASE DEFICIT: 1 mmol/L (ref 0.0–2.0)
ACID-BASE DEFICIT: 3 mmol/L — AB (ref 0.0–2.0)
Acid-Base Excess: 1 mmol/L (ref 0.0–2.0)
Acid-base deficit: 5 mmol/L — ABNORMAL HIGH (ref 0.0–2.0)
Acid-base deficit: 2 mmol/L (ref 0.0–2.0)
BICARBONATE: 26.7 meq/L — AB (ref 20.0–24.0)
Bicarbonate: 27.9 mEq/L — ABNORMAL HIGH (ref 20.0–24.0)
Bicarbonate: 26 mEq/L — ABNORMAL HIGH (ref 20.0–24.0)
Bicarbonate: 24.4 mEq/L — ABNORMAL HIGH (ref 20.0–24.0)
Bicarbonate: 24.3 mEq/L — ABNORMAL HIGH (ref 20.0–24.0)
Bicarbonate: 23.4 mEq/L (ref 20.0–24.0)
O2 SAT: 100 %
O2 SAT: 100 %
O2 SAT: 93 %
O2 SAT: 97 %
O2 Saturation: 100 %
O2 Saturation: 82 %
PCO2 ART: 54.1 mmHg — AB (ref 35.0–45.0)
PCO2 ART: 60.7 mmHg — AB (ref 35.0–45.0)
PCO2 ART: 50.4 mmHg — AB (ref 35.0–45.0)
PO2 ART: 431 mmHg — AB (ref 80.0–100.0)
PO2 ART: 376 mmHg — AB (ref 80.0–100.0)
PO2 ART: 102 mmHg — AB (ref 80.0–100.0)
Patient temperature: 35.3
Patient temperature: 100
TCO2: 28 mmol/L (ref 0–100)
TCO2: 30 mmol/L (ref 0–100)
TCO2: 28 mmol/L (ref 0–100)
TCO2: 26 mmol/L (ref 0–100)
TCO2: 26 mmol/L (ref 0–100)
TCO2: 25 mmol/L (ref 0–100)
pCO2 arterial: 50.8 mmHg — ABNORMAL HIGH (ref 35.0–45.0)
pCO2 arterial: 53.4 mmHg — ABNORMAL HIGH (ref 35.0–45.0)
pCO2 arterial: 44.2 mmHg (ref 35.0–45.0)
pH, Arterial: 7.33 — ABNORMAL LOW (ref 7.350–7.450)
pH, Arterial: 7.327 — ABNORMAL LOW (ref 7.350–7.450)
pH, Arterial: 7.289 — ABNORMAL LOW (ref 7.350–7.450)
pH, Arterial: 7.203 — ABNORMAL LOW (ref 7.350–7.450)
pH, Arterial: 7.29 — ABNORMAL LOW (ref 7.350–7.450)
pH, Arterial: 7.336 — ABNORMAL LOW (ref 7.350–7.450)
pO2, Arterial: 402 mmHg — ABNORMAL HIGH (ref 80.0–100.0)
pO2, Arterial: 52 mmHg — ABNORMAL LOW (ref 80.0–100.0)
pO2, Arterial: 75 mmHg — ABNORMAL LOW (ref 80.0–100.0)

## 2014-06-17 LAB — CBC
HCT: 34.6 % — ABNORMAL LOW (ref 39.0–52.0)
HCT: 29.7 % — ABNORMAL LOW (ref 39.0–52.0)
HEMOGLOBIN: 10.4 g/dL — AB (ref 13.0–17.0)
Hemoglobin: 11.6 g/dL — ABNORMAL LOW (ref 13.0–17.0)
MCH: 29.4 pg (ref 26.0–34.0)
MCH: 30.5 pg (ref 26.0–34.0)
MCHC: 33.5 g/dL (ref 30.0–36.0)
MCHC: 35 g/dL (ref 30.0–36.0)
MCV: 87.6 fL (ref 78.0–100.0)
MCV: 87.1 fL (ref 78.0–100.0)
PLATELETS: 138 10*3/uL — AB (ref 150–400)
Platelets: 112 10*3/uL — ABNORMAL LOW (ref 150–400)
RBC: 3.95 MIL/uL — ABNORMAL LOW (ref 4.22–5.81)
RBC: 3.41 MIL/uL — ABNORMAL LOW (ref 4.22–5.81)
RDW: 12.7 % (ref 11.5–15.5)
RDW: 12.8 % (ref 11.5–15.5)
WBC: 16.7 10*3/uL — ABNORMAL HIGH (ref 4.0–10.5)
WBC: 18.7 10*3/uL — ABNORMAL HIGH (ref 4.0–10.5)

## 2014-06-17 LAB — POCT I-STAT, CHEM 8
BUN: 10 mg/dL (ref 6–23)
BUN: 10 mg/dL (ref 6–23)
BUN: 9 mg/dL (ref 6–23)
BUN: 10 mg/dL (ref 6–23)
BUN: 10 mg/dL (ref 6–23)
BUN: 9 mg/dL (ref 6–23)
CALCIUM ION: 1.17 mmol/L (ref 1.13–1.30)
CALCIUM ION: 1.23 mmol/L (ref 1.13–1.30)
CALCIUM ION: 1.34 mmol/L — AB (ref 1.13–1.30)
CHLORIDE: 102 meq/L (ref 96–112)
CHLORIDE: 103 meq/L (ref 96–112)
CREATININE: 0.9 mg/dL (ref 0.50–1.35)
Calcium, Ion: 1.4 mmol/L — ABNORMAL HIGH (ref 1.13–1.30)
Calcium, Ion: 1.41 mmol/L — ABNORMAL HIGH (ref 1.13–1.30)
Calcium, Ion: 1.26 mmol/L (ref 1.13–1.30)
Chloride: 102 mEq/L (ref 96–112)
Chloride: 100 mEq/L (ref 96–112)
Chloride: 99 mEq/L (ref 96–112)
Chloride: 100 mEq/L (ref 96–112)
Creatinine, Ser: 0.6 mg/dL (ref 0.50–1.35)
Creatinine, Ser: 0.6 mg/dL (ref 0.50–1.35)
Creatinine, Ser: 0.7 mg/dL (ref 0.50–1.35)
Creatinine, Ser: 0.8 mg/dL (ref 0.50–1.35)
Creatinine, Ser: 0.8 mg/dL (ref 0.50–1.35)
GLUCOSE: 121 mg/dL — AB (ref 70–99)
GLUCOSE: 106 mg/dL — AB (ref 70–99)
Glucose, Bld: 124 mg/dL — ABNORMAL HIGH (ref 70–99)
Glucose, Bld: 102 mg/dL — ABNORMAL HIGH (ref 70–99)
Glucose, Bld: 96 mg/dL (ref 70–99)
Glucose, Bld: 148 mg/dL — ABNORMAL HIGH (ref 70–99)
HCT: 29 % — ABNORMAL LOW (ref 39.0–52.0)
HCT: 34 % — ABNORMAL LOW (ref 39.0–52.0)
HCT: 26 % — ABNORMAL LOW (ref 39.0–52.0)
HCT: 29 % — ABNORMAL LOW (ref 39.0–52.0)
HEMATOCRIT: 35 % — AB (ref 39.0–52.0)
HEMATOCRIT: 26 % — AB (ref 39.0–52.0)
HEMOGLOBIN: 11.9 g/dL — AB (ref 13.0–17.0)
Hemoglobin: 11.6 g/dL — ABNORMAL LOW (ref 13.0–17.0)
Hemoglobin: 9.9 g/dL — ABNORMAL LOW (ref 13.0–17.0)
Hemoglobin: 8.8 g/dL — ABNORMAL LOW (ref 13.0–17.0)
Hemoglobin: 8.8 g/dL — ABNORMAL LOW (ref 13.0–17.0)
Hemoglobin: 9.9 g/dL — ABNORMAL LOW (ref 13.0–17.0)
Potassium: 3.7 mmol/L (ref 3.5–5.1)
Potassium: 3.6 mmol/L (ref 3.5–5.1)
Potassium: 3.7 mmol/L (ref 3.5–5.1)
Potassium: 4.5 mmol/L (ref 3.5–5.1)
Potassium: 4.5 mmol/L (ref 3.5–5.1)
Potassium: 4.2 mmol/L (ref 3.5–5.1)
SODIUM: 138 mmol/L (ref 135–145)
SODIUM: 136 mmol/L (ref 135–145)
Sodium: 138 mmol/L (ref 135–145)
Sodium: 140 mmol/L (ref 135–145)
Sodium: 136 mmol/L (ref 135–145)
Sodium: 140 mmol/L (ref 135–145)
TCO2: 24 mmol/L (ref 0–100)
TCO2: 24 mmol/L (ref 0–100)
TCO2: 25 mmol/L (ref 0–100)
TCO2: 24 mmol/L (ref 0–100)
TCO2: 22 mmol/L (ref 0–100)
TCO2: 21 mmol/L (ref 0–100)

## 2014-06-17 LAB — GLUCOSE, CAPILLARY
GLUCOSE-CAPILLARY: 126 mg/dL — AB (ref 70–99)
Glucose-Capillary: 144 mg/dL — ABNORMAL HIGH (ref 70–99)
Glucose-Capillary: 109 mg/dL — ABNORMAL HIGH (ref 70–99)
Glucose-Capillary: 108 mg/dL — ABNORMAL HIGH (ref 70–99)

## 2014-06-17 LAB — PLATELET COUNT: Platelets: 127 10*3/uL — ABNORMAL LOW (ref 150–400)

## 2014-06-17 LAB — PROTIME-INR
INR: 1.45 (ref 0.00–1.49)
Prothrombin Time: 17.8 seconds — ABNORMAL HIGH (ref 11.6–15.2)

## 2014-06-17 LAB — CREATININE, SERUM
CREATININE: 0.99 mg/dL (ref 0.50–1.35)
GFR calc Af Amer: >90 mL/min (ref >90)
GFR, EST NON AFRICAN AMERICAN: 87 mL/min — AB (ref >90)

## 2014-06-17 LAB — CARBOXYHEMOGLOBIN
CARBOXYHEMOGLOBIN: 0.9 % (ref 0.5–1.5)
Methemoglobin: 0.8 % (ref 0.0–1.5)
O2 SAT: 58.5 %
Total hemoglobin: 10 g/dL — ABNORMAL LOW (ref 13.5–18.0)

## 2014-06-17 LAB — HEMOGLOBIN AND HEMATOCRIT, BLOOD
HEMATOCRIT: 27 % — AB (ref 39.0–52.0)
Hemoglobin: 9.1 g/dL — ABNORMAL LOW (ref 13.0–17.0)

## 2014-06-17 LAB — MAGNESIUM: Magnesium: 2.4 mg/dL (ref 1.5–2.5)

## 2014-06-17 LAB — POCT I-STAT 4, (NA,K, GLUC, HGB,HCT)
GLUCOSE: 117 mg/dL — AB (ref 70–99)
Glucose, Bld: 177 mg/dL — ABNORMAL HIGH (ref 70–99)
HCT: 28 % — ABNORMAL LOW (ref 39.0–52.0)
HEMATOCRIT: 35 % — AB (ref 39.0–52.0)
HEMOGLOBIN: 11.9 g/dL — AB (ref 13.0–17.0)
HEMOGLOBIN: 9.5 g/dL — AB (ref 13.0–17.0)
Potassium: 3.7 mmol/L (ref 3.5–5.1)
Potassium: 4.2 mmol/L (ref 3.5–5.1)
SODIUM: 141 mmol/L (ref 135–145)
Sodium: 138 mmol/L (ref 135–145)

## 2014-06-17 LAB — APTT: aPTT: 73 seconds — ABNORMAL HIGH (ref 24–37)

## 2014-06-17 SURGERY — REPLACEMENT, AORTIC VALVE, OPEN
Anesthesia: General | Site: Chest

## 2014-06-17 MED ORDER — INSULIN REGULAR BOLUS VIA INFUSION
0.0000 [IU] | Freq: Three times a day (TID) | INTRAVENOUS | Status: DC
Start: 2014-06-17 — End: 2014-06-17
  Filled 2014-06-17: qty 10

## 2014-06-17 MED ORDER — MIDAZOLAM HCL 2 MG/2ML IJ SOLN
2.0000 mg | INTRAMUSCULAR | Status: DC | PRN
  Administered 2014-06-17: 2 mg via INTRAVENOUS
  Filled 2014-06-17: qty 2

## 2014-06-17 MED ORDER — PROPOFOL 10 MG/ML IV BOLUS
INTRAVENOUS | Status: AC
  Filled 2014-06-17: qty 20

## 2014-06-17 MED ORDER — SODIUM BICARBONATE 8.4 % IV SOLN
50.0000 meq | Freq: Once | INTRAVENOUS | Status: AC
  Administered 2014-06-17: 50 meq via INTRAVENOUS

## 2014-06-17 MED ORDER — BIOTENE DRY MOUTH MT LIQD
15.0000 mL | Freq: Four times a day (QID) | OROMUCOSAL | Status: DC
  Administered 2014-06-17 – 2014-06-21 (×11): 15 mL via OROMUCOSAL

## 2014-06-17 MED ORDER — METOPROLOL TARTRATE 12.5 MG HALF TABLET
12.5000 mg | ORAL_TABLET | Freq: Two times a day (BID) | ORAL | Status: DC
  Administered 2014-06-18: 12.5 mg via ORAL
  Filled 2014-06-17 (×5): qty 1

## 2014-06-17 MED ORDER — THROMBIN 20000 UNITS EX SOLR
CUTANEOUS | Status: DC | PRN
  Administered 2014-06-17: 20000 [IU] via TOPICAL

## 2014-06-17 MED ORDER — SUCCINYLCHOLINE CHLORIDE 20 MG/ML IJ SOLN
INTRAMUSCULAR | Status: AC
  Filled 2014-06-17: qty 1

## 2014-06-17 MED ORDER — SODIUM CHLORIDE 0.9 % IV SOLN
INTRAVENOUS | Status: DC
  Filled 2014-06-17: qty 2.5

## 2014-06-17 MED ORDER — EPINEPHRINE HCL 1 MG/ML IJ SOLN
INTRAMUSCULAR | Status: DC | PRN
  Administered 2014-06-17 (×2): .1 mg via INTRAVENOUS
  Administered 2014-06-17 (×2): .2 mg via INTRAVENOUS
  Administered 2014-06-17: .1 mg via INTRAVENOUS

## 2014-06-17 MED ORDER — PANTOPRAZOLE SODIUM 40 MG PO TBEC
40.0000 mg | DELAYED_RELEASE_TABLET | Freq: Every day | ORAL | Status: DC
  Administered 2014-06-19 – 2014-06-21 (×3): 40 mg via ORAL
  Filled 2014-06-17 (×3): qty 1

## 2014-06-17 MED ORDER — ADULT MULTIVITAMIN W/MINERALS CH
1.0000 | ORAL_TABLET | Freq: Every day | ORAL | Status: DC
  Administered 2014-06-18 – 2014-06-20 (×3): 1 via ORAL
  Filled 2014-06-17 (×4): qty 1

## 2014-06-17 MED ORDER — CALCIUM CHLORIDE 10 % IV SOLN
1.0000 g | Freq: Once | INTRAVENOUS | Status: AC
  Administered 2014-06-17: 1 g via INTRAVENOUS

## 2014-06-17 MED ORDER — SODIUM CHLORIDE 0.9 % IJ SOLN
3.0000 mL | Freq: Two times a day (BID) | INTRAMUSCULAR | Status: DC
  Administered 2014-06-18 – 2014-06-19 (×3): 3 mL via INTRAVENOUS
  Administered 2014-06-20: 6 mL via INTRAVENOUS
  Administered 2014-06-20 – 2014-06-21 (×2): 3 mL via INTRAVENOUS

## 2014-06-17 MED ORDER — FAMOTIDINE IN NACL 20-0.9 MG/50ML-% IV SOLN
20.0000 mg | Freq: Two times a day (BID) | INTRAVENOUS | Status: AC
  Administered 2014-06-17: 20 mg via INTRAVENOUS
  Filled 2014-06-17: qty 50

## 2014-06-17 MED ORDER — VANCOMYCIN HCL IN DEXTROSE 1-5 GM/200ML-% IV SOLN
1000.0000 mg | Freq: Once | INTRAVENOUS | Status: AC
  Administered 2014-06-17: 1000 mg via INTRAVENOUS
  Filled 2014-06-17: qty 200

## 2014-06-17 MED ORDER — VECURONIUM BROMIDE 10 MG IV SOLR
INTRAVENOUS | Status: AC
  Filled 2014-06-17: qty 10

## 2014-06-17 MED ORDER — ROCURONIUM BROMIDE 50 MG/5ML IV SOLN
INTRAVENOUS | Status: AC
  Filled 2014-06-17: qty 2

## 2014-06-17 MED ORDER — MIDAZOLAM HCL 5 MG/5ML IJ SOLN
INTRAMUSCULAR | Status: DC | PRN
  Administered 2014-06-17: 2 mg via INTRAVENOUS
  Administered 2014-06-17 (×2): 3 mg via INTRAVENOUS
  Administered 2014-06-17: 2 mg via INTRAVENOUS

## 2014-06-17 MED ORDER — THROMBIN 20000 UNITS EX SOLR
CUTANEOUS | Status: AC
  Filled 2014-06-17: qty 20000

## 2014-06-17 MED ORDER — HEPARIN SODIUM (PORCINE) 1000 UNIT/ML IJ SOLN
INTRAMUSCULAR | Status: DC | PRN
  Administered 2014-06-17: 20000 [IU] via INTRAVENOUS

## 2014-06-17 MED ORDER — PHENYLEPHRINE HCL 10 MG/ML IJ SOLN
0.0000 ug/min | INTRAVENOUS | Status: DC
  Administered 2014-06-17: 26.67 ug/min via INTRAVENOUS
  Administered 2014-06-18: 20 ug/min via INTRAVENOUS
  Administered 2014-06-19: 12 ug/min via INTRAVENOUS
  Filled 2014-06-17 (×4): qty 2

## 2014-06-17 MED ORDER — 0.9 % SODIUM CHLORIDE (POUR BTL) OPTIME
TOPICAL | Status: DC | PRN
  Administered 2014-06-17: 6000 mL

## 2014-06-17 MED ORDER — SODIUM CHLORIDE 0.9 % IV SOLN: 250.0000 mL | INTRAVENOUS | Status: DC

## 2014-06-17 MED ORDER — ACETAMINOPHEN 650 MG RE SUPP
650.0000 mg | Freq: Once | RECTAL | Status: AC
  Administered 2014-06-17: 650 mg via RECTAL

## 2014-06-17 MED ORDER — SODIUM CHLORIDE 0.9 % IJ SOLN
OROMUCOSAL | Status: DC | PRN
  Administered 2014-06-17 (×5): 1000 mL via TOPICAL

## 2014-06-17 MED ORDER — SODIUM CHLORIDE 0.45 % IV SOLN: INTRAVENOUS | Status: DC

## 2014-06-17 MED ORDER — DEXTROSE 5 % IV SOLN
1.5000 g | Freq: Two times a day (BID) | INTRAVENOUS | Status: AC
  Administered 2014-06-17 – 2014-06-19 (×4): 1.5 g via INTRAVENOUS
  Filled 2014-06-17 (×4): qty 1.5

## 2014-06-17 MED ORDER — ONDANSETRON HCL 4 MG/2ML IJ SOLN
4.0000 mg | Freq: Four times a day (QID) | INTRAMUSCULAR | Status: DC | PRN
  Administered 2014-06-18: 4 mg via INTRAVENOUS
  Filled 2014-06-17: qty 2

## 2014-06-17 MED ORDER — SODIUM CHLORIDE 0.9 % IV SOLN: INTRAVENOUS | Status: DC

## 2014-06-17 MED ORDER — ROCURONIUM BROMIDE 100 MG/10ML IV SOLN
INTRAVENOUS | Status: DC | PRN
  Administered 2014-06-17 (×2): 50 mg via INTRAVENOUS

## 2014-06-17 MED ORDER — METOPROLOL TARTRATE 25 MG/10 ML ORAL SUSPENSION
12.5000 mg | Freq: Two times a day (BID) | ORAL | Status: DC
  Filled 2014-06-17 (×5): qty 5

## 2014-06-17 MED ORDER — MAGNESIUM SULFATE 4 GM/100ML IV SOLN
4.0000 g | Freq: Once | INTRAVENOUS | Status: AC
  Administered 2014-06-17: 4 g via INTRAVENOUS
  Filled 2014-06-17: qty 100

## 2014-06-17 MED ORDER — NITROGLYCERIN IN D5W 200-5 MCG/ML-% IV SOLN: 0.0000 ug/min | INTRAVENOUS | Status: DC

## 2014-06-17 MED ORDER — SODIUM CHLORIDE 0.9 % IJ SOLN
INTRAMUSCULAR | Status: AC
  Filled 2014-06-17: qty 10

## 2014-06-17 MED ORDER — SODIUM CHLORIDE 0.9 % IJ SOLN: 3.0000 mL | INTRAMUSCULAR | Status: DC | PRN

## 2014-06-17 MED ORDER — FENTANYL CITRATE 0.05 MG/ML IJ SOLN
INTRAMUSCULAR | Status: AC
  Filled 2014-06-17: qty 5

## 2014-06-17 MED ORDER — POTASSIUM CHLORIDE 10 MEQ/50ML IV SOLN
10.0000 meq | INTRAVENOUS | Status: AC
  Administered 2014-06-17 (×3): 10 meq via INTRAVENOUS

## 2014-06-17 MED ORDER — ACETAMINOPHEN 500 MG PO TABS
1000.0000 mg | ORAL_TABLET | Freq: Four times a day (QID) | ORAL | Status: DC
  Administered 2014-06-18 – 2014-06-19 (×5): 1000 mg via ORAL
  Filled 2014-06-17 (×13): qty 2

## 2014-06-17 MED ORDER — NOREPINEPHRINE BITARTRATE 1 MG/ML IV SOLN
0.0000 ug/min | INTRAVENOUS | Status: AC
  Administered 2014-06-17: 5 ug/min via INTRAVENOUS
  Administered 2014-06-17: 200 ug/min via INTRAVENOUS
  Filled 2014-06-17: qty 16

## 2014-06-17 MED ORDER — LACTATED RINGERS IV SOLN: INTRAVENOUS | Status: DC

## 2014-06-17 MED ORDER — SODIUM BICARBONATE 8.4 % IV SOLN
INTRAVENOUS | Status: AC
  Filled 2014-06-17: qty 50

## 2014-06-17 MED ORDER — STERILE WATER FOR INJECTION IJ SOLN
INTRAMUSCULAR | Status: AC
  Filled 2014-06-17: qty 10

## 2014-06-17 MED ORDER — PROPOFOL 10 MG/ML IV BOLUS
INTRAVENOUS | Status: DC | PRN
  Administered 2014-06-17: 30 mg via INTRAVENOUS

## 2014-06-17 MED ORDER — DOCUSATE SODIUM 100 MG PO CAPS
200.0000 mg | ORAL_CAPSULE | Freq: Every day | ORAL | Status: DC
  Administered 2014-06-18 – 2014-06-20 (×3): 200 mg via ORAL
  Filled 2014-06-17 (×3): qty 2

## 2014-06-17 MED ORDER — INSULIN ASPART 100 UNIT/ML ~~LOC~~ SOLN
0.0000 [IU] | SUBCUTANEOUS | Status: DC
  Administered 2014-06-18 – 2014-06-19 (×5): 2 [IU] via SUBCUTANEOUS

## 2014-06-17 MED ORDER — ACETAMINOPHEN 160 MG/5ML PO SOLN: 1000.0000 mg | Freq: Four times a day (QID) | ORAL | Status: DC

## 2014-06-17 MED ORDER — ALBUTEROL SULFATE HFA 108 (90 BASE) MCG/ACT IN AERS
INHALATION_SPRAY | RESPIRATORY_TRACT | Status: DC | PRN
  Administered 2014-06-17: 4 via RESPIRATORY_TRACT

## 2014-06-17 MED ORDER — FENTANYL CITRATE 0.05 MG/ML IJ SOLN
INTRAMUSCULAR | Status: DC | PRN
  Administered 2014-06-17 (×4): 250 ug via INTRAVENOUS

## 2014-06-17 MED ORDER — LACTATED RINGERS IV SOLN
INTRAVENOUS | Status: DC | PRN
  Administered 2014-06-17 (×2): via INTRAVENOUS

## 2014-06-17 MED ORDER — VECURONIUM BROMIDE 10 MG IV SOLR
INTRAVENOUS | Status: DC | PRN
  Administered 2014-06-17: 3 mg via INTRAVENOUS
  Administered 2014-06-17: 2 mg via INTRAVENOUS
  Administered 2014-06-17: 3 mg via INTRAVENOUS
  Administered 2014-06-17: 2 mg via INTRAVENOUS

## 2014-06-17 MED ORDER — BISACODYL 5 MG PO TBEC
10.0000 mg | DELAYED_RELEASE_TABLET | Freq: Every day | ORAL | Status: DC
  Administered 2014-06-18 – 2014-06-20 (×3): 10 mg via ORAL
  Filled 2014-06-17 (×3): qty 2

## 2014-06-17 MED ORDER — METOPROLOL TARTRATE 1 MG/ML IV SOLN: 2.5000 mg | INTRAVENOUS | Status: DC | PRN

## 2014-06-17 MED ORDER — ALBUMIN HUMAN 5 % IV SOLN
INTRAVENOUS | Status: AC
  Administered 2014-06-17: 12.5 g
  Filled 2014-06-17: qty 500

## 2014-06-17 MED ORDER — DOPAMINE-DEXTROSE 3.2-5 MG/ML-% IV SOLN
3.0000 ug/kg/min | INTRAVENOUS | Status: DC
  Administered 2014-06-19: 3 ug/kg/min via INTRAVENOUS
  Filled 2014-06-17: qty 250

## 2014-06-17 MED ORDER — TRAMADOL HCL 50 MG PO TABS
50.0000 mg | ORAL_TABLET | ORAL | Status: DC | PRN
  Administered 2014-06-20: 100 mg via ORAL
  Filled 2014-06-17: qty 2

## 2014-06-17 MED ORDER — DEXMEDETOMIDINE HCL IN NACL 200 MCG/50ML IV SOLN
0.0000 ug/kg/h | INTRAVENOUS | Status: DC
  Administered 2014-06-17 – 2014-06-18 (×5): 0.7 ug/kg/h via INTRAVENOUS
  Filled 2014-06-17 (×5): qty 50

## 2014-06-17 MED ORDER — MIDAZOLAM HCL 10 MG/2ML IJ SOLN
INTRAMUSCULAR | Status: AC
  Filled 2014-06-17: qty 2

## 2014-06-17 MED ORDER — LACTATED RINGERS IV SOLN: 500.0000 mL | Freq: Once | INTRAVENOUS | Status: AC | PRN

## 2014-06-17 MED ORDER — MORPHINE SULFATE 2 MG/ML IJ SOLN
2.0000 mg | INTRAMUSCULAR | Status: DC | PRN
  Administered 2014-06-18 (×4): 2 mg via INTRAVENOUS
  Administered 2014-06-19 – 2014-06-21 (×5): 4 mg via INTRAVENOUS
  Filled 2014-06-17: qty 1
  Filled 2014-06-17 (×2): qty 2
  Filled 2014-06-17 (×2): qty 1
  Filled 2014-06-17 (×2): qty 2
  Filled 2014-06-17: qty 1
  Filled 2014-06-17: qty 2

## 2014-06-17 MED ORDER — EPHEDRINE SULFATE 50 MG/ML IJ SOLN
INTRAMUSCULAR | Status: AC
  Filled 2014-06-17: qty 1

## 2014-06-17 MED ORDER — BISACODYL 10 MG RE SUPP: 10.0000 mg | Freq: Every day | RECTAL | Status: DC

## 2014-06-17 MED ORDER — EPINEPHRINE HCL 0.1 MG/ML IJ SOSY
PREFILLED_SYRINGE | INTRAMUSCULAR | Status: AC
  Filled 2014-06-17: qty 20

## 2014-06-17 MED ORDER — ALBUMIN HUMAN 5 % IV SOLN
12.5000 g | Freq: Once | INTRAVENOUS | Status: AC
  Administered 2014-06-17: 12.5 g via INTRAVENOUS

## 2014-06-17 MED ORDER — ACETAMINOPHEN 160 MG/5ML PO SOLN: 650.0000 mg | Freq: Once | ORAL | Status: AC

## 2014-06-17 MED ORDER — ALPRAZOLAM 0.5 MG PO TABS
0.5000 mg | ORAL_TABLET | Freq: Two times a day (BID) | ORAL | Status: DC | PRN
  Administered 2014-06-20 – 2014-06-23 (×5): 0.5 mg via ORAL
  Filled 2014-06-17 (×5): qty 1

## 2014-06-17 MED ORDER — PROTAMINE SULFATE 10 MG/ML IV SOLN
INTRAVENOUS | Status: DC | PRN
  Administered 2014-06-17: 150 mg via INTRAVENOUS

## 2014-06-17 MED ORDER — ALBUMIN HUMAN 5 % IV SOLN
250.0000 mL | INTRAVENOUS | Status: AC | PRN
  Administered 2014-06-17 (×4): 250 mL via INTRAVENOUS
  Filled 2014-06-17 (×3): qty 250

## 2014-06-17 MED ORDER — OXYCODONE HCL 5 MG PO TABS
5.0000 mg | ORAL_TABLET | ORAL | Status: DC | PRN
  Administered 2014-06-18 – 2014-06-19 (×5): 10 mg via ORAL
  Administered 2014-06-20: 5 mg via ORAL
  Administered 2014-06-20 – 2014-06-21 (×4): 10 mg via ORAL
  Filled 2014-06-17 (×5): qty 2
  Filled 2014-06-17: qty 1
  Filled 2014-06-17 (×4): qty 2

## 2014-06-17 MED ORDER — LIDOCAINE HCL (CARDIAC) 20 MG/ML IV SOLN
INTRAVENOUS | Status: DC | PRN
  Administered 2014-06-17: 100 mg via INTRAVENOUS

## 2014-06-17 MED ORDER — ALBUMIN HUMAN 5 % IV SOLN
INTRAVENOUS | Status: DC | PRN
  Administered 2014-06-17 (×2): via INTRAVENOUS

## 2014-06-17 MED ORDER — PHENYLEPHRINE 40 MCG/ML (10ML) SYRINGE FOR IV PUSH (FOR BLOOD PRESSURE SUPPORT)
PREFILLED_SYRINGE | INTRAVENOUS | Status: AC
  Filled 2014-06-17: qty 10

## 2014-06-17 MED ORDER — CHLORHEXIDINE GLUCONATE 0.12 % MT SOLN
15.0000 mL | Freq: Two times a day (BID) | OROMUCOSAL | Status: DC
  Administered 2014-06-17 – 2014-06-20 (×7): 15 mL via OROMUCOSAL
  Filled 2014-06-17 (×5): qty 15

## 2014-06-17 MED ORDER — HEMOSTATIC AGENTS (NO CHARGE) OPTIME
TOPICAL | Status: DC | PRN
  Administered 2014-06-17 (×2): 1 via TOPICAL

## 2014-06-17 MED ORDER — MORPHINE SULFATE 2 MG/ML IJ SOLN
1.0000 mg | INTRAMUSCULAR | Status: AC | PRN
  Administered 2014-06-17 (×2): 2 mg via INTRAVENOUS
  Filled 2014-06-17 (×2): qty 1

## 2014-06-17 MED ORDER — NOREPINEPHRINE BITARTRATE 1 MG/ML IV SOLN
0.0000 ug/min | INTRAVENOUS | Status: DC
  Administered 2014-06-17: 14 ug/min via INTRAVENOUS
  Administered 2014-06-18: 9 ug/min via INTRAVENOUS
  Filled 2014-06-17: qty 16

## 2014-06-17 MED FILL — Sodium Chloride IV Soln 0.9%: INTRAVENOUS | Qty: 2000 | Status: AC

## 2014-06-17 MED FILL — Heparin Sodium (Porcine) Inj 1000 Unit/ML: INTRAMUSCULAR | Qty: 20 | Status: AC

## 2014-06-17 MED FILL — Lidocaine HCl IV Inj 20 MG/ML: INTRAVENOUS | Qty: 10 | Status: AC

## 2014-06-17 MED FILL — Mannitol IV Soln 20%: INTRAVENOUS | Qty: 500 | Status: AC

## 2014-06-17 MED FILL — Sodium Bicarbonate IV Soln 8.4%: INTRAVENOUS | Qty: 50 | Status: AC

## 2014-06-17 MED FILL — Electrolyte-R (PH 7.4) Solution: INTRAVENOUS | Qty: 3000 | Status: AC

## 2014-06-17 SURGICAL SUPPLY — 91 items
ADAPTER CARDIO PERF ANTE/RETRO (ADAPTER) ×4 IMPLANT
ADPR PRFSN 84XANTGRD RTRGD (ADAPTER) ×2
ARTERIAL PRESSURE LINE (MISCELLANEOUS) ×4 IMPLANT
ATTRACTOMAT 16X20 MAGNETIC DRP (DRAPES) ×4 IMPLANT
BAG DECANTER FOR FLEXI CONT (MISCELLANEOUS) ×4 IMPLANT
BLADE STERNUM SYSTEM 6 (BLADE) ×4 IMPLANT
BLADE SURG 15 STRL LF DISP TIS (BLADE) ×2 IMPLANT
BLADE SURG 15 STRL SS (BLADE) ×4
CANISTER SUCTION 2500CC (MISCELLANEOUS) ×4 IMPLANT
CANN PRFSN 3/8X14X24FR PCFC (MISCELLANEOUS) ×2
CANNULA GUNDRY RCSP 15FR (MISCELLANEOUS) ×4 IMPLANT
CANNULA PRFSN 3/8X14X24FR PCFC (MISCELLANEOUS) IMPLANT
CANNULA VEN MTL TIP RT (MISCELLANEOUS) ×4
CANNULA VRC MALB SNGL STG 36FR (MISCELLANEOUS) IMPLANT
CATH ROBINSON RED A/P 18FR (CATHETERS) ×16 IMPLANT
CATH THORACIC 36FR (CATHETERS) ×4 IMPLANT
CATH THORACIC 36FR RT ANG (CATHETERS) ×4 IMPLANT
CONN 1/2X1/2X1/2  BEN (MISCELLANEOUS) ×4
CONN 1/2X1/2X1/2 BEN (MISCELLANEOUS) ×2 IMPLANT
CONN 3/8X1/2 ST GISH (MISCELLANEOUS) ×8 IMPLANT
CONT SPEC STER OR (MISCELLANEOUS) ×4 IMPLANT
COVER SURGICAL LIGHT HANDLE (MISCELLANEOUS) ×8 IMPLANT
CRADLE DONUT ADULT HEAD (MISCELLANEOUS) ×4 IMPLANT
DRAPE CARDIOVASCULAR INCISE (DRAPES) ×4
DRAPE SLUSH/WARMER DISC (DRAPES) ×4 IMPLANT
DRAPE SRG 135X102X78XABS (DRAPES) ×2 IMPLANT
DRSG COVADERM 4X14 (GAUZE/BANDAGES/DRESSINGS) ×4 IMPLANT
ELECT CAUTERY BLADE 6.4 (BLADE) ×4 IMPLANT
ELECT REM PT RETURN 9FT ADLT (ELECTROSURGICAL) ×8
ELECTRODE REM PT RTRN 9FT ADLT (ELECTROSURGICAL) ×4 IMPLANT
GAUZE SPONGE 4X4 12PLY STRL (GAUZE/BANDAGES/DRESSINGS) ×8 IMPLANT
GLOVE BIO SURGEON STRL SZ 6 (GLOVE) ×4 IMPLANT
GLOVE BIO SURGEON STRL SZ 6.5 (GLOVE) ×3 IMPLANT
GLOVE BIO SURGEON STRL SZ7 (GLOVE) IMPLANT
GLOVE BIO SURGEON STRL SZ7.5 (GLOVE) IMPLANT
GLOVE BIO SURGEONS STRL SZ 6.5 (GLOVE) ×3
GLOVE BIOGEL PI IND STRL 7.0 (GLOVE) IMPLANT
GLOVE BIOGEL PI INDICATOR 7.0 (GLOVE) ×4
GLOVE EUDERMIC 7 POWDERFREE (GLOVE) ×10 IMPLANT
GOWN STRL REUS W/ TWL LRG LVL3 (GOWN DISPOSABLE) ×8 IMPLANT
GOWN STRL REUS W/ TWL XL LVL3 (GOWN DISPOSABLE) ×2 IMPLANT
GOWN STRL REUS W/TWL LRG LVL3 (GOWN DISPOSABLE) ×28
GOWN STRL REUS W/TWL XL LVL3 (GOWN DISPOSABLE) ×4
HEART VENT LT CURVED (MISCELLANEOUS) ×4 IMPLANT
HEMOSTAT POWDER SURGIFOAM 1G (HEMOSTASIS) ×16 IMPLANT
HEMOSTAT SURGICEL 2X14 (HEMOSTASIS) ×6 IMPLANT
KIT BASIN OR (CUSTOM PROCEDURE TRAY) ×4 IMPLANT
KIT CATH CPB BARTLE (MISCELLANEOUS) ×4 IMPLANT
KIT ROOM TURNOVER OR (KITS) ×4 IMPLANT
KIT SUCTION CATH 14FR (SUCTIONS) ×4 IMPLANT
LINE VENT (MISCELLANEOUS) ×2 IMPLANT
LOOP VESSEL SUPERMAXI WHITE (MISCELLANEOUS) ×6 IMPLANT
NS IRRIG 1000ML POUR BTL (IV SOLUTION) ×24 IMPLANT
PACK OPEN HEART (CUSTOM PROCEDURE TRAY) ×4 IMPLANT
PAD ARMBOARD 7.5X6 YLW CONV (MISCELLANEOUS) ×8 IMPLANT
RING MITRAL MEMO 3D 26MM SMD26 (Prosthesis & Implant Heart) ×2 IMPLANT
SET CARDIOPLEGIA MPS 5001102 (MISCELLANEOUS) ×2 IMPLANT
SPONGE GAUZE 4X4 12PLY STER LF (GAUZE/BANDAGES/DRESSINGS) ×2 IMPLANT
SPONGE LAP 18X18 X RAY DECT (DISPOSABLE) ×2 IMPLANT
STOPCOCK MORSE 400PSI 3WAY (MISCELLANEOUS) ×2 IMPLANT
SUCKER WEIGHTED FLEX (MISCELLANEOUS) ×4 IMPLANT
SUT BONE WAX W31G (SUTURE) ×4 IMPLANT
SUT ETHIBON 2 0 V 52N 30 (SUTURE) ×8 IMPLANT
SUT ETHIBOND (SUTURE) ×4 IMPLANT
SUT ETHIBOND 2 0 SH (SUTURE) ×4 IMPLANT
SUT ETHIBOND 2 0 SH 36X2 (SUTURE) ×2 IMPLANT
SUT ETHIBOND 2 0 V4 (SUTURE) IMPLANT
SUT ETHIBOND 2 0V4 GREEN (SUTURE) IMPLANT
SUT ETHIBOND 2-0 RB-1 WHT (SUTURE) ×4 IMPLANT
SUT PROLENE 3 0 SH DA (SUTURE) IMPLANT
SUT PROLENE 3 0 SH1 36 (SUTURE) ×6 IMPLANT
SUT PROLENE 4 0 RB 1 (SUTURE) ×16
SUT PROLENE 4-0 RB1 .5 CRCL 36 (SUTURE) ×6 IMPLANT
SUT SILK 2 0 SH CR/8 (SUTURE) ×4 IMPLANT
SUT STEEL 6MS V (SUTURE) ×10 IMPLANT
SUT VIC AB 1 CTX 27 (SUTURE) ×2 IMPLANT
SUT VIC AB 1 CTX 36 (SUTURE) ×8
SUT VIC AB 1 CTX36XBRD ANBCTR (SUTURE) ×4 IMPLANT
SUT VIC AB 2-0 CTX 27 (SUTURE) ×2 IMPLANT
SUTURE E-PAK OPEN HEART (SUTURE) ×4 IMPLANT
SYSTEM SAHARA CHEST DRAIN ATS (WOUND CARE) ×4 IMPLANT
TAPE CLOTH SURG 4X10 WHT LF (GAUZE/BANDAGES/DRESSINGS) ×2 IMPLANT
TAPE PAPER 2X10 WHT MICROPORE (GAUZE/BANDAGES/DRESSINGS) ×2 IMPLANT
TOWEL OR 17X24 6PK STRL BLUE (TOWEL DISPOSABLE) ×8 IMPLANT
TOWEL OR 17X26 10 PK STRL BLUE (TOWEL DISPOSABLE) ×8 IMPLANT
TRAY CATH LUMEN 1 20CM STRL (SET/KITS/TRAYS/PACK) ×2 IMPLANT
TRAY FOLEY IC TEMP SENS 16FR (CATHETERS) ×4 IMPLANT
UNDERPAD 30X30 INCONTINENT (UNDERPADS AND DIAPERS) ×4 IMPLANT
VALVE MAGNA EASE 21MM (Prosthesis & Implant Heart) ×2 IMPLANT
VRC MALLEABLE SINGLE STG 36FR (MISCELLANEOUS) ×4
WATER STERILE IRR 1000ML POUR (IV SOLUTION) ×8 IMPLANT

## 2014-06-17 NOTE — Transfer of Care (Signed)
Immediate Anesthesia Transfer of Care Note  Patient: Russell [name]  Procedure(s) Performed: Procedure(s): AORTIC VALVE REPLACEMENT (AVR) (N/A) MITRAL VALVE REPAIR (MVR) (N/A) TRANSESOPHAGEAL ECHOCARDIOGRAM (TEE) (N/A)  Patient Location: SICU  Anesthesia Type:General  Level of Consciousness: sedated and Patient remains intubated per anesthesia plan  Airway & Oxygen Therapy: Patient placed on Ventilator (see vital sign flow sheet for setting)  Post-op Assessment: Report given to PACU RN, Post -op Vital signs reviewed and unstable, Anesthesiologist notified and Dr. Laneta Simmers aware.   Post vital signs: Reviewed  Complications: No apparent anesthesia complications

## 2014-06-17 NOTE — Progress Notes (Signed)
Patient ID: Russell [name], male   DOB: Dec 19, 1955, 59 y.o.   MRN: [id number]  TCTS evening rounds:  He has been very labile postop requiring high dose levophed and 7 units of albumin so far to maintain BP. His swan was malfunctioning in the OR and pulled out on transport so we have been following CVP which is 5 now.   He also dropped his oxygen sats postop and CXR showed bilateral lower lobe atelectasis and edema. He is still on 100% O2 with sats 96%. Will wean as tolerated.  Rhythm has been sinus with trigeminy.  Excellent urine ouput  Chest tube output low.  Postop labs ok. 6 hr labs pending.

## 2014-06-17 NOTE — Brief Op Note (Addendum)
06/17/2014  11:21 AM  PATIENT:  Russell [name]  59 y.o. male  PRE-OPERATIVE DIAGNOSIS:  1. SEVERE AS 2. MODERATE AI 3. MODERATE TO SEVERE MR  POST-OPERATIVE DIAGNOSIS:  1. SEVERE AS 2. MODERATE AI 3. MODERATE TO SEVERE MR  PROCEDURE:  TRANSESOPHAGEAL ECHOCARDIOGRAM (TEE), MEDIAN STERNOTOMY for AORTIC VALVE REPLACEMENT (AVR) (using a Pericardial tissue valve, size 21mm) and MITRAL VALVE REPAIR (MVR) (using a Sorin Memo 3D Ring, size 26 mm)   SURGEON:  Surgeon(s) and Role:    * Alleen Borne, MD - Primary  PHYSICIAN ASSISTANT: Doree Fudge PA-C  ANESTHESIA:   general  EBL:  Total I/O In: 1600 [I.V.:1600] Out: 225 [Urine:225]  DRAINS: Chest tubes in the mediastinal and pleural spaces   SPECIMEN:  Source of Specimen:  Native AV leaflets  DISPOSITION OF SPECIMEN:  PATHOLOGY  COUNTS CORRECT:  YES  DICTATION: .Dragon Dictation  PLAN OF CARE: Admit to inpatient   PATIENT DISPOSITION:  ICU - intubated and hemodynamically stable.   Delay start of Pharmacological VTE agent (>24hrs) due to surgical blood loss or risk of bleeding: yes  BASELINE WEIGHT: 66 kg  Aortic Valve Etiology   Aortic Insufficiency:  Moderate  Aortic Valve Disease:  Yes.  Aortic Stenosis:  Yes. Smallest Aortic Valve Area: 0.48cm2; Highest Mean Gradient: .  Etiology (Choose at least one and up to  5 etiologies):  Degenerative - Calcified   Aortic Valve  Procedure Performed:  Replacement: Yes.  Bioprosthetic Valve. Implant Model Number:[id number], Size:21 mm, Unique Device Identifier:[id number]  Repair/Reconstruction: No.   Aortic Annular Enlargement: No.    Mitral Valve Etiology  MV Insufficiency: Moderate  MV Disease: No.  MV Stenosis: No mitral valve stenosis.  MV Disease Functional Class: MV Disease Functional Class: Type II.  Etiology (Choose at least one and up to five): Other.  MV Lesions (Choose at least one): No additional lesions.    Mitral/Tricuspid/Pulmonary Valve  Procedure  Mitral Valve Procedure Performed:  Repair: Annuloplasty.  Implant: Annuloplasty Device: Implant model number [id number], Size 26, Unique Device Identifier [id number].  Tricuspid Valve Procedure Performed:  Annuloplasty only.  Implant: N/A  Pulmonary Valve Procedure Performed: N/A  Implant: N/A

## 2014-06-17 NOTE — OR Nursing (Signed)
13:00 - 2nd call to SICU charge nurse

## 2014-06-17 NOTE — Anesthesia Preprocedure Evaluation (Addendum)
Anesthesia Evaluation  Patient identified by MRN, date of birth, ID band Patient awake    Reviewed: Allergy & Precautions, H&P , NPO status , Patient's Chart, lab work & pertinent test results  Airway Mallampati: II  TM Distance: >3 FB Neck ROM: Full    Dental  (+) Edentulous Upper, Edentulous Lower   Pulmonary sleep apnea , COPDformer smoker,          Cardiovascular hypertension, Pt. on medications + DOE + Valvular Problems/Murmurs AS, AI and MR     Neuro/Psych Anxiety negative neurological ROS     GI/Hepatic negative GI ROS, Neg liver ROS,   Endo/Other  negative endocrine ROS  Renal/GU negative Renal ROS     Musculoskeletal  (+) Arthritis -,   Abdominal   Peds  Hematology negative hematology ROS (+)   Anesthesia Other Findings   Reproductive/Obstetrics                            Anesthesia Physical Anesthesia Plan  ASA: IV  Anesthesia Plan: General   Post-op Pain Management:    Induction: Intravenous  Airway Management Planned: Oral ETT  Additional Equipment: Arterial line, CVP, PA Cath, 3D TEE and Ultrasound Guidance Line Placement  Intra-op Plan:   Post-operative Plan: Post-operative intubation/ventilation  Informed Consent: I have reviewed the patients History and Physical, chart, labs and discussed the procedure including the risks, benefits and alternatives for the proposed anesthesia with the patient or authorized representative who has indicated his/her understanding and acceptance.   Dental advisory given  Plan Discussed with: Anesthesiologist and CRNA  Anesthesia Plan Comments:        Anesthesia Quick Evaluation

## 2014-06-17 NOTE — Anesthesia Procedure Notes (Signed)
Procedure Name: Intubation Date/Time: 06/17/2014 7:44 AM Performed by: Quentin Ore Pre-anesthesia Checklist: Patient identified, Emergency Drugs available, Suction available, Patient being monitored and Timeout performed Patient Re-evaluated:Patient Re-evaluated prior to inductionOxygen Delivery Method: Circle system utilized Preoxygenation: Pre-oxygenation with 100% oxygen Intubation Type: IV induction Ventilation: Mask ventilation without difficulty Laryngoscope Size: Mac and 3 Grade View: Grade I Tube type: Oral Tube size: 8.0 mm Number of attempts: 1 Airway Equipment and Method: Stylet Placement Confirmation: ETT inserted through vocal cords under direct vision,  positive ETCO2 and breath sounds checked- equal and bilateral Secured at: 23 cm Tube secured with: Tape Dental Injury: Teeth and Oropharynx as per pre-operative assessment

## 2014-06-17 NOTE — OR Nursing (Signed)
11:45 - 1st call to SICU nurse

## 2014-06-17 NOTE — Progress Notes (Signed)
Pt pulling at lines and tubes, restrained with bilateral wrist restraints.

## 2014-06-17 NOTE — Progress Notes (Signed)
Echocardiogram Echocardiogram Transesophageal has been performed.  Dorothey Baseman 06/17/2014, 8:40 AM

## 2014-06-17 NOTE — Interval H&P Note (Signed)
History and Physical Interval Note:  06/17/2014 7:14 AM  Durenda Guthrie  has presented today for surgery, with the diagnosis of SEVERE AS MR  The various methods of treatment have been discussed with the patient and family. After consideration of risks, benefits and other options for treatment, the patient has consented to  Procedure(s): AORTIC VALVE REPLACEMENT (AVR) (N/A) MITRAL VALVE REPAIR (MVR) (N/A) TRANSESOPHAGEAL ECHOCARDIOGRAM (TEE) (N/A) as a surgical intervention .  The patient's history has been reviewed, patient examined, no change in status, stable for surgery.  I have reviewed the patient's chart and labs.  Questions were answered to the patient's satisfaction.     Alleen Borne

## 2014-06-17 NOTE — Anesthesia Postprocedure Evaluation (Signed)
  Anesthesia Post-op Note  Patient: Russell [name]  Procedure(s) Performed: Procedure(s): AORTIC VALVE REPLACEMENT (AVR) (N/A) MITRAL VALVE REPAIR (MVR) (N/A) TRANSESOPHAGEAL ECHOCARDIOGRAM (TEE) (N/A)  Patient Location: ICU  Anesthesia Type:General  Level of Consciousness: Patient remains intubated per anesthesia plan  Airway and Oxygen Therapy: Patient remains intubated per anesthesia plan  Post-op Pain: none  Post-op Assessment: Post-op Vital signs reviewed  Post-op Vital Signs: Reviewed  Last Vitals:  Filed Vitals:   06/17/14 1600  BP:   Pulse: 124  Temp:   Resp: 14    Complications: No apparent anesthesia complications

## 2014-06-17 NOTE — Op Note (Signed)
CARDIOVASCULAR SURGERY OPERATIVE NOTE  06/17/2014 YASSEEN SALLS [id number]  Surgeon:  Alleen Borne, MD  First Assistant: Doree Fudge,  PA-C   Preoperative Diagnosis:  Severe aortic stenosis and aortic insufficiency, severe mitral regurgitation, severe LV dysfunction with chronic systolic heart failure.   Postoperative Diagnosis:  Same   Procedure: 1. Median Sternotomy 2. Extracorporeal circulation 3. Aortic valve replacement using a 21 mm Edwards Magna-Ease pericardial valve. 4. Mitral annuloplasty using a 26 mm Sorin Memo 3D ring.  Anesthesia:  General Endotracheal   Clinical History/Surgical Indication:  The patient is a 59 year old gentleman who has noticed a 2-3 month history of progressive exertional fatigue and shortness of breath and was recently hospitalized for chest pain. He ruled out for MI. An echo at [facility] showed severe AS with a mean gradient of 40 mm Hg and moderate AI. There was moderate to severe MR and severe LA dilatation. LVEF was 40%. There was marked LV dilatation with a LVIDd of 6.4 cm and a LVIDs of 5.85 cm. He had a cardiac cath on 05/23/2014 showing no significant coronary disease with a CI of 2.5 and PAP of 44/24. PA sat was 64%.He is having shortness of breath with minimal exertion. He has severe aortic stenosis and moderate AI with moderate to severe MR and severe LV dysfunction with an EF of 30-40% with a dilated LV and chronic systolic hear. He has become progressively symptomatic over the past few months and was very active and asymptomatic prior to that. I think AVR and MV repair is indicated to improve his life style-limiting symptoms and prevent further LV deterioration, congestive heart failure, and death. I discussed the operative procedure with the patient and his wife and brother including alternatives, benefits and risks; including but not limited to bleeding, blood transfusion, infection, stroke, myocardial infarction, graft  failure, heart block requiring a permanent pacemaker, organ dysfunction, and death. We discussed the pros and cons of mechanical and tissue valves and he would like to use a tissue valve so that he does not have to take coumadin long-term. He understands that he will still need to be on coumadin for at least 3 months postop with a mitral valve repair. Durenda Guthrie understands and agrees to proceed.   Preparation:  The patient was seen in the preoperative holding area and the correct patient, correct operation were confirmed with the patient after reviewing the medical record and catheterization. The consent was signed by me. Preoperative antibiotics were given. A pulmonary arterial line and radial arterial line were placed by the anesthesia team. The patient was taken back to the operating room and positioned supine on the operating room table. After being placed under general endotracheal anesthesia by the anesthesia team a foley catheter was placed. The neck, chest, abdomen, and both legs were prepped with betadine soap and solution and draped in the usual sterile manner. A surgical time-out was taken and the correct patient and operative procedure were confirmed with the nursing and anesthesia staff.   Pre-bypass TEE:    Complete TEE assessment was performed by Dr. Marcene Duos. This showed severe AS and severe AI. There was severe central MR that appeared to be from a combination of leaflet tethering due to LV dilatation and remodeling and annular dilatation. The LV was dilated and globally hypokinetic with an EF of 35%.    Post-bypass TEE:   Normal functioning prosthetic aortic valve with no perivalvular leak or regurgitation through the valve. There was mild  residual central MR that I think is due to leaflet tethering. Left ventricular function was unchanged.  Cardiopulmonary Bypass:  A median sternotomy was performed. The pericardium was opened in the midline. Right  ventricular function appeared normal. The ascending aorta was of normal size and had no palpable plaque. There were no contraindications to aortic cannulation or cross-clamping. The patient was fully systemically heparinized and the ACT was maintained > 400 sec. The proximal aortic arch was cannulated with a 20 F aortic cannula for arterial inflow. Venous cannulation was performed via bi-caval venous cannulation using a 24 F metal tip cannula in the SVC and a 36 F plastic cannula in the IVC. An antegrade cardioplegia/vent cannula was inserted into the mid-ascending aorta. A retrograde cardioplegia cannnula was placed into the coronary sinus via the right atrium. Aortic occlusion was performed with a single cross-clamp. Systemic cooling to 32 degrees Centigrade and topical cooling of the heart with iced saline were used. Hyperkalemic antegrade cold blood cardioplegia was used to induce diastolic arrest and then cold blood retrograde cardioplegia was given at about 20 minute intervals throughout the period of arrest to maintain myocardial temperature at or below 10 degrees centigrade. A temperature probe was inserted into the interventricular septum and an insulating pad was placed in the pericardium. Carbon dioxide was insufflated into the pericardium at 5L/min throughout the procedure to minimize intracardiac air.  Mitral Valve Repair:  The left atrium was opened through a vertical incision in the interatrial groove. Exposure was good. Valve inspection showed normal leaflets with normal subvalvular apparatus. There was no prolapse and the regurgitation appeared to be related to a loss of coaptation from annular enlargement and tethering of the free edges of the leaflets from LV dilation. A series of pledgetted 2-0 Ethibond sutures were placed around the mitral annulus. The anterior leaflet was sized and a 26 mm Sorin 3D Memo annuloplasty ring (REF [id number], Serial # [id number]) was chosen. The sutures were placed  through the ring and it was lowered into place. The sutures were tied. The valve was tested with saline with complete distension of the LV and there was trivial central regurgitation. The atrium was closed with 2 layers of continuous 3-0 prolene suture.  Aortic Valve Replacement:   A transverse aortotomy was performed 1 cm above the take-off of the right coronary artery. The native valve was tricuspid with calcified leaflets and moderate annular calcification. The ostia of the left coronary artery was in normal position and was not obstructed. The ostium of the RCA was close to the left-right commissure and was not obstructed. The native valve leaflets were excised and the annulus was decalcified with rongeurs. Care was taken to remove all particulate debris. The left ventricle was directly inspected for debris and then irrigated with ice saline solution. The annulus was sized and a size 21 mm Edwards Magna-Ease pericardial valve was chosen. The model number was [id number] and the serial number was [id number]. While the valve was being prepared 2-0 Ethibond pledgeted horizontal mattress sutures were placed around the annulus with the pledgets in a sub-annular position. The sutures were placed through the sewing ring and the valve lowered into place. The sutures were tied sequentially. The valve seated nicely and the coronary ostia were not obstructed. The prosthetic valve leaflets moved normally and there was no sub-valvular obstruction. The aortotomy was closed using 4-0 Prolene suture in 2 layers with felt strips to reinforce the closure.  Completion:  The patient was rewarmed to 37 degrees Centigrade.  De-airing maneuvers were performed and the head placed in trendelenburg position. The crossclamp was removed with a time of 128 minutes. There was spontaneous return of sinus rhythm. The aortotomy was checked for hemostasis. Two temporary epicardial pacing wires were placed on the right atrium and two on the right  ventricle. The retrograde cardioplegia cannula was removed. The patient was weaned from CPB without difficulty on dopamine at 3 mcg/kg/min. CPB time was 154 minutes. Cardiac output could not be measured due to PA catheter malfunction. Heparin was fully reversed with protamine and the aortic and venous cannulas removed. Hemostasis was achieved. Mediastinal drainage tubes were placed. The sternum was closed with double #6 stainless steel wires. The fascia was closed with continuous # 1 vicryl suture. The subcutaneous tissue was closed with 2-0 vicryl continuous suture. The skin was closed with 3-0 vicryl subcuticular suture. All sponge, needle, and instrument counts were reported correct at the end of the case. Dry sterile dressings were placed over the incisions and around the chest tubes which were connected to pleurevac suction. The patient was then transported to the surgical intensive care unit in critical but stable condition.

## 2014-06-18 ENCOUNTER — Encounter (HOSPITAL_COMMUNITY): Payer: Self-pay | Admitting: Surgery

## 2014-06-18 ENCOUNTER — Inpatient Hospital Stay (HOSPITAL_COMMUNITY): Payer: BLUE CROSS/BLUE SHIELD

## 2014-06-18 LAB — POCT I-STAT, CHEM 8
BUN: 12 mg/dL (ref 6–23)
CALCIUM ION: 1.32 mmol/L — AB (ref 1.13–1.30)
CREATININE: 0.9 mg/dL (ref 0.50–1.35)
Chloride: 98 mEq/L (ref 96–112)
Glucose, Bld: 140 mg/dL — ABNORMAL HIGH (ref 70–99)
HEMATOCRIT: 27 % — AB (ref 39.0–52.0)
Hemoglobin: 9.2 g/dL — ABNORMAL LOW (ref 13.0–17.0)
Potassium: 3.7 mmol/L (ref 3.5–5.1)
Sodium: 138 mmol/L (ref 135–145)
TCO2: 23 mmol/L (ref 0–100)

## 2014-06-18 LAB — POCT I-STAT 3, ART BLOOD GAS (G3+)
Acid-Base Excess: 1 mmol/L (ref 0.0–2.0)
Acid-base deficit: 1 mmol/L (ref 0.0–2.0)
Bicarbonate: 23.2 mEq/L (ref 20.0–24.0)
Bicarbonate: 25.2 mEq/L — ABNORMAL HIGH (ref 20.0–24.0)
Bicarbonate: 24 mEq/L (ref 20.0–24.0)
O2 Saturation: 95 %
O2 Saturation: 90 %
O2 Saturation: 92 %
PH ART: 7.39 (ref 7.350–7.450)
PH ART: 7.432 (ref 7.350–7.450)
PO2 ART: 56 mmHg — AB (ref 80.0–100.0)
Patient temperature: 98.7
Patient temperature: 99.1
TCO2: 24 mmol/L (ref 0–100)
TCO2: 26 mmol/L (ref 0–100)
TCO2: 25 mmol/L (ref 0–100)
pCO2 arterial: 38.5 mmHg (ref 35.0–45.0)
pCO2 arterial: 36 mmHg (ref 35.0–45.0)
pCO2 arterial: 36.2 mmHg (ref 35.0–45.0)
pH, Arterial: 7.454 — ABNORMAL HIGH (ref 7.350–7.450)
pO2, Arterial: 81 mmHg (ref 80.0–100.0)
pO2, Arterial: 64 mmHg — ABNORMAL LOW (ref 80.0–100.0)

## 2014-06-18 LAB — GLUCOSE, CAPILLARY
GLUCOSE-CAPILLARY: 117 mg/dL — AB (ref 70–99)
Glucose-Capillary: 105 mg/dL — ABNORMAL HIGH (ref 70–99)
Glucose-Capillary: 111 mg/dL — ABNORMAL HIGH (ref 70–99)
Glucose-Capillary: 117 mg/dL — ABNORMAL HIGH (ref 70–99)
Glucose-Capillary: 117 mg/dL — ABNORMAL HIGH (ref 70–99)
Glucose-Capillary: 131 mg/dL — ABNORMAL HIGH (ref 70–99)
Glucose-Capillary: 138 mg/dL — ABNORMAL HIGH (ref 70–99)
Glucose-Capillary: 146 mg/dL — ABNORMAL HIGH (ref 70–99)

## 2014-06-18 LAB — BASIC METABOLIC PANEL
ANION GAP: 9 (ref 5–15)
BUN: 9 mg/dL (ref 6–23)
CO2: 22 mmol/L (ref 19–32)
CREATININE: 0.94 mg/dL (ref 0.50–1.35)
Calcium: 8.9 mg/dL (ref 8.4–10.5)
Chloride: 107 mEq/L (ref 96–112)
GFR calc non Af Amer: 89 mL/min — ABNORMAL LOW (ref >90)
Glucose, Bld: 154 mg/dL — ABNORMAL HIGH (ref 70–99)
POTASSIUM: 3.7 mmol/L (ref 3.5–5.1)
SODIUM: 138 mmol/L (ref 135–145)

## 2014-06-18 LAB — CBC
HCT: 27.4 % — ABNORMAL LOW (ref 39.0–52.0)
HEMATOCRIT: 26.8 % — AB (ref 39.0–52.0)
HEMOGLOBIN: 9.1 g/dL — AB (ref 13.0–17.0)
Hemoglobin: 9.5 g/dL — ABNORMAL LOW (ref 13.0–17.0)
MCH: 29.7 pg (ref 26.0–34.0)
MCH: 29.2 pg (ref 26.0–34.0)
MCHC: 34.7 g/dL (ref 30.0–36.0)
MCHC: 34 g/dL (ref 30.0–36.0)
MCV: 85.6 fL (ref 78.0–100.0)
MCV: 85.9 fL (ref 78.0–100.0)
Platelets: 89 10*3/uL — ABNORMAL LOW (ref 150–400)
Platelets: 92 10*3/uL — ABNORMAL LOW (ref 150–400)
RBC: 3.2 MIL/uL — AB (ref 4.22–5.81)
RBC: 3.12 MIL/uL — ABNORMAL LOW (ref 4.22–5.81)
RDW: 12.9 % (ref 11.5–15.5)
RDW: 12.8 % (ref 11.5–15.5)
WBC: 19.2 10*3/uL — ABNORMAL HIGH (ref 4.0–10.5)
WBC: 19 10*3/uL — ABNORMAL HIGH (ref 4.0–10.5)

## 2014-06-18 LAB — CREATININE, SERUM
Creatinine, Ser: 0.96 mg/dL (ref 0.50–1.35)
GFR calc Af Amer: >90 mL/min (ref >90)
GFR calc non Af Amer: 88 mL/min — ABNORMAL LOW (ref >90)

## 2014-06-18 LAB — CARBOXYHEMOGLOBIN
Carboxyhemoglobin: 0.6 % (ref 0.5–1.5)
METHEMOGLOBIN: 0.9 % (ref 0.0–1.5)
O2 SAT: 72.3 %
TOTAL HEMOGLOBIN: 9.7 g/dL — AB (ref 13.5–18.0)

## 2014-06-18 LAB — MAGNESIUM
Magnesium: 2 mg/dL (ref 1.5–2.5)
Magnesium: 2 mg/dL (ref 1.5–2.5)

## 2014-06-18 MED ORDER — POTASSIUM CHLORIDE 10 MEQ/50ML IV SOLN
10.0000 meq | INTRAVENOUS | Status: AC
  Administered 2014-06-18 (×3): 10 meq via INTRAVENOUS

## 2014-06-18 MED ORDER — POTASSIUM CHLORIDE 10 MEQ/50ML IV SOLN
10.0000 meq | INTRAVENOUS | Status: AC
  Administered 2014-06-18 (×3): 10 meq via INTRAVENOUS
  Filled 2014-06-18: qty 50

## 2014-06-18 MED ORDER — FUROSEMIDE 10 MG/ML IJ SOLN
40.0000 mg | Freq: Once | INTRAMUSCULAR | Status: AC
  Administered 2014-06-18: 40 mg via INTRAVENOUS

## 2014-06-18 NOTE — Progress Notes (Signed)
S/p AVR, mitral repair  BP 109/76 mmHg  Pulse 101  Temp(Src) 99.4 F (37.4 C) (Core (Comment))  Resp 18  Ht 5\' 8"  (1.727 m)  Wt 145 lb (65.772 kg)  BMI 22.05 kg/m2  SpO2 99%   Intake/Output Summary (Last 24 hours) at 06/18/14 1921 Last data filed at 06/18/14 1900  Gross per 24 hour  Intake 2590.85 ml  Output   4835 ml  Net -2244.15 ml    Extubated, comfortable  K= 3.7, Hct 27  Continue current care

## 2014-06-18 NOTE — Plan of Care (Signed)
Problem: Phase II - Intermediate Post-Op Goal: Wean to Extubate Outcome: Progressing FiO2 at 60% Goal: CBGs/Blood Glucose per SCIP Criteria Outcome: Progressing CBG Q4  Goal: Pain controlled with appropriate interventions Outcome: Progressing Required prn morphine for pain    Goal: Advance Diet Outcome: Not Progressing Pt remains NPO    Goal: Activity Progressed Outcome: Progressing Pt is still on bedrest, remains intubated and sedated

## 2014-06-18 NOTE — Progress Notes (Signed)
Utilization Review Completed.  

## 2014-06-18 NOTE — Addendum Note (Signed)
Addendum  created 06/18/14 1221 by Quentin OreLuz E Archita Lomeli, CRNA   Modules edited: Anesthesia Medication Administration

## 2014-06-18 NOTE — Progress Notes (Signed)
1 Day Post-Op Procedure(s) (LRB): AORTIC VALVE REPLACEMENT (AVR) (N/A) MITRAL VALVE REPAIR (MVR) (N/A) TRANSESOPHAGEAL ECHOCARDIOGRAM (TEE) (N/A) Subjective:  Intubated but follows commands  Objective: Vital signs in last 24 hours: Temp:  [95.7 F (35.4 C)-100.4 F (38 C)] 99.1 F (37.3 C) (01/05 0737) Pulse Rate:  [50-141] 76 (01/05 0737) Cardiac Rhythm:  [-] Atrial paced (01/05 0700) Resp:  [13-23] 18 (01/05 0737) BP: (66-145)/(49-88) 120/77 mmHg (01/05 0737) SpO2:  [84 %-100 %] 100 % (01/05 0737) Arterial Line BP: (85-166)/(37-97) 119/72 mmHg (01/05 0700) FiO2 (%):  [50 %-100 %] 50 % (01/05 0737)  Hemodynamic parameters for last 24 hours: PAP: (9-14)/(5-6) 9/6 mmHg CVP:  [0 mmHg-18 mmHg] 9 mmHg  Intake/Output from previous day: 01/04 0701 - 01/05 0700 In: 7755 [I.V.:4615; Blood:440; IV Piggyback:2700] Out: 4855 [Urine:3015; Emesis/NG output:350; Blood:800; Chest Tube:690] Intake/Output this shift:    Neurologic: intact Heart: regular rate and rhythm and systolic ejection murmur Lungs: clear to auscultation bilaterally Extremities: moderate edema Wound: dressing dry  Lab Results:  Recent Labs  06/17/14 2000 06/17/14 2012 06/18/14 0440  WBC 18.7*  --  19.2*  HGB 10.4* 9.9* 9.5*  HCT 29.7* 29.0* 27.4*  PLT 112*  --  89*   BMET:  Recent Labs  06/17/14 2012 06/18/14 0440  NA 140 138  K 4.2 3.7  CL 103 107  CO2  --  22  GLUCOSE 148* 154*  BUN 9 9  CREATININE 0.90 0.94  CALCIUM  --  8.9    PT/INR:  Recent Labs  06/17/14 1400  LABPROT 17.8*  INR 1.45   ABG    Component Value Date/Time   PHART 7.390 06/18/2014 0555   HCO3 23.2 06/18/2014 0555   TCO2 24 06/18/2014 0555   ACIDBASEDEF 1.0 06/18/2014 0555   O2SAT 95.0 06/18/2014 0555   CBG (last 3)   Recent Labs  06/17/14 1926 06/17/14 2327 06/18/14 0400  GLUCAP 108* 138* 146*   CLINICAL DATA: Pneumothorax.  EXAM: PORTABLE CHEST - 1 VIEW  COMPARISON: June 17, 2014.  FINDINGS: Endotracheal and nasogastric tubes appear to be in grossly good position. Sternotomy wires are noted. Right internal jugular venous sheath remains. Left subclavian catheter line is noted with distal tip overlying expected position of SVC. Mediastinal drain is noted. No pneumothorax is noted. Bilateral pleural effusions are noted with right greater than left.  IMPRESSION: Stable support apparatus. Stable bilateral pleural effusions with right greater than left. No pneumothorax is noted.   Electronically Signed  By: Roque LiasJames Green M.D.  On: 06/18/2014 07:47   ECG:  Looks junctional  Assessment/Plan: S/P Procedure(s) (LRB): AORTIC VALVE REPLACEMENT (AVR) (N/A) MITRAL VALVE REPAIR (MVR) (N/A) TRANSESOPHAGEAL ECHOCARDIOGRAM (TEE) (N/A)  He is more stable today and weaning levophed. Co-ox is 72%. Wean to extubate. Continue low dose dopamine. Preop EF 35% with dilated LV.  Volume excess: diurese.  I can't view CXR since the PACS system is down but apparently shows some pleural effusion.   LOS: 1 day    Russell [name] K 06/18/2014

## 2014-06-18 NOTE — Procedures (Signed)
Extubation Procedure Note  Patient Details:   Name: Russell GuthrieCharles R Waiters DOB: 1956/05/31 MRN: [id number]   Airway Documentation:     Evaluation  O2 sats: stable throughout Complications: No apparent complications Patient did tolerate procedure well. Bilateral Breath Sounds: Clear Suctioning: Oral, Airway Yes   Pt extubated per Rapid Wean Heart Protocol. NIF -30, VC 1.1, ABG PaO2 low at 53 but the rest within normal limits. Ok to extubate per Dr Garen GramsBartles. Pt placed on 4L [place]. Pt stable throughout with no complications. Pt able to speak his name and cough post extubation. RT will continue to monitor.  Carolan ShiverKelley, Charels Stambaugh M 06/18/2014, 9:57 AM

## 2014-06-19 ENCOUNTER — Inpatient Hospital Stay (HOSPITAL_COMMUNITY): Payer: BLUE CROSS/BLUE SHIELD

## 2014-06-19 LAB — GLUCOSE, CAPILLARY
GLUCOSE-CAPILLARY: 147 mg/dL — AB (ref 70–99)
GLUCOSE-CAPILLARY: 87 mg/dL (ref 70–99)
Glucose-Capillary: 130 mg/dL — ABNORMAL HIGH (ref 70–99)
Glucose-Capillary: 117 mg/dL — ABNORMAL HIGH (ref 70–99)
Glucose-Capillary: 101 mg/dL — ABNORMAL HIGH (ref 70–99)

## 2014-06-19 MED ORDER — ENOXAPARIN SODIUM 40 MG/0.4ML ~~LOC~~ SOLN
40.0000 mg | Freq: Every day | SUBCUTANEOUS | Status: DC
  Administered 2014-06-19: 40 mg via SUBCUTANEOUS
  Filled 2014-06-19 (×2): qty 0.4

## 2014-06-19 MED ORDER — POTASSIUM CHLORIDE CRYS ER 20 MEQ PO TBCR
20.0000 meq | EXTENDED_RELEASE_TABLET | Freq: Once | ORAL | Status: AC
  Administered 2014-06-19: 20 meq via ORAL
  Filled 2014-06-19: qty 1

## 2014-06-19 MED ORDER — FUROSEMIDE 10 MG/ML IJ SOLN: 40.0000 mg | Freq: Once | INTRAMUSCULAR | Status: DC

## 2014-06-19 MED ORDER — METOPROLOL TARTRATE 25 MG/10 ML ORAL SUSPENSION
12.5000 mg | Freq: Two times a day (BID) | ORAL | Status: DC
Start: 2014-06-19 — End: 2014-06-19

## 2014-06-19 MED ORDER — FUROSEMIDE 10 MG/ML IJ SOLN
40.0000 mg | Freq: Once | INTRAMUSCULAR | Status: AC
  Administered 2014-06-19: 40 mg via INTRAVENOUS
  Filled 2014-06-19: qty 4

## 2014-06-19 MED ORDER — POTASSIUM CHLORIDE CRYS ER 20 MEQ PO TBCR
30.0000 meq | EXTENDED_RELEASE_TABLET | Freq: Once | ORAL | Status: AC
  Administered 2014-06-19: 30 meq via ORAL
  Filled 2014-06-19 (×2): qty 1

## 2014-06-19 MED ORDER — METOPROLOL TARTRATE 25 MG PO TABS: 25.0000 mg | ORAL_TABLET | Freq: Two times a day (BID) | ORAL | Status: DC

## 2014-06-19 NOTE — Progress Notes (Signed)
EVENING ROUNDS NOTE :     [address]       [place] [id number]             [phone]                 2 Days Post-Op Procedure(s) (LRB): AORTIC VALVE REPLACEMENT (AVR) (N/A) MITRAL VALVE REPAIR (MVR) (N/A) TRANSESOPHAGEAL ECHOCARDIOGRAM (TEE) (N/A)  Total Length of Stay:  LOS: 2 days  BP 104/71 mmHg  Pulse 89  Temp(Src) 98.9 F (37.2 C) (Oral)  Resp 21  Ht 5\' 8"  (1.727 m)  Wt 162 lb 0.6 oz (73.5 kg)  BMI 24.64 kg/m2  SpO2 98%  .Intake/Output      01/06 0701 - 01/07 0700   P.O. 360   I.V. (mL/kg) 158.4 (2.2)   NG/GT    IV Piggyback    Total Intake(mL/kg) 518.4 (7.1)   Urine (mL/kg/hr) 2550 (2.7)   Chest Tube 40 (0)   Total Output 2590   Net -2071.6         . sodium chloride    . sodium chloride    . sodium chloride    . DOPamine 3 mcg/kg/min (06/19/14 0600)  . lactated ringers    . norepinephrine (LEVOPHED) Adult infusion Stopped (06/19/14 1600)  . phenylephrine (NEO-SYNEPHRINE) Adult infusion 12 mcg/min (06/19/14 1800)     Lab Results  Component Value Date   WBC 19.0* 06/18/2014   HGB 9.2* 06/18/2014   HCT 27.0* 06/18/2014   PLT 92* 06/18/2014   GLUCOSE 140* 06/18/2014   CHOL 183 05/16/2014   TRIG 125 05/16/2014   HDL 65 05/16/2014   LDLCALC 93 05/16/2014   ALT 17 06/13/2014   AST 22 06/13/2014   NA 138 06/18/2014   K 3.7 06/18/2014   CL 98 06/18/2014   CREATININE 0.90 06/18/2014   BUN 12 06/18/2014   CO2 22 06/18/2014   TSH 0.638 05/16/2014   INR 1.45 06/17/2014   HGBA1C 6.0* 06/13/2014   Doing well on low dose dopamine, good uo, other gtts off Progressing well    Halei Hanover E, PA-C

## 2014-06-19 NOTE — Progress Notes (Addendum)
[address]       [place] [id number]             [phone]        2 Days Post-Op Procedure(s) (LRB): AORTIC VALVE REPLACEMENT (AVR) (N/A) MITRAL VALVE REPAIR (MVR) (N/A) TRANSESOPHAGEAL ECHOCARDIOGRAM (TEE) (N/A)  Subjective: Patient had a bad night as had severe incisional pain. Oxy has helped a lot this am.  Objective: Vital signs in last 24 hours: Temp:  [98.4 F (36.9 C)-99.6 F (37.6 C)] 99.5 F (37.5 C) (01/06 0600) Pulse Rate:  [51-107] 51 (01/06 0600) Cardiac Rhythm:  [-] Normal sinus rhythm (01/06 0400) Resp:  [13-25] 19 (01/06 0600) BP: (73-137)/(47-94) 128/94 mmHg (01/06 0600) SpO2:  [90 %-99 %] 95 % (01/06 0600) Arterial Line BP: (77-112)/(49-69) 100/62 mmHg (01/05 1200) FiO2 (%):  [40 %] 40 % (01/05 0900) Weight:  [162 lb 0.6 oz (73.5 kg)] 162 lb 0.6 oz (73.5 kg) (01/06 0500)  Pre op weight 66 kg Current Weight  06/19/14 162 lb 0.6 oz (73.5 kg)      Intake/Output from previous day: 01/05 0701 - 01/06 0700 In: 1821.4 [P.O.:550; I.V.:941.4; NG/GT:30; IV Piggyback:300] Out: 4345 [Urine:4035; Chest Tube:310]   Physical Exam:  Cardiovascular: Slightly tachy, no murmurs Pulmonary: Diminished at bases; no rales, wheezes, or rhonchi. Abdomen: Soft, non tender, bowel sounds present. Extremities: SCDs in place Wounds: Sternal dressing is clean and dry.  .  Lab Results: CBC: Recent Labs  06/18/14 0440 06/18/14 1500 06/18/14 1545  WBC 19.2* 19.0*  --   HGB 9.5* 9.1* 9.2*  HCT 27.4* 26.8* 27.0*  PLT 89* 92*  --    BMET:  Recent Labs  06/18/14 0440 06/18/14 1500 06/18/14 1545  NA 138  --  138  K 3.7  --  3.7  CL 107  --  98  CO2 22  --   --   GLUCOSE 154*  --  140*  BUN 9  --  12  CREATININE 0.94 0.96 0.90  CALCIUM 8.9  --   --     PT/INR:  Lab Results  Component Value Date   INR 1.45 06/17/2014   INR 0.97 06/13/2014   INR 1.00 05/23/2014   ABG:  INR: Will add last result for INR, ABG once components are  confirmed Will add last 4 CBG results once components are confirmed  Assessment/Plan:  1. CV - ST with HR in the low 100's. On Neo synephrine, Levophed, and Dopamine drips, and Lopressor 12.5 bid. Will increase to 25 bid. Wean drips as tolerates.  2.  Pulmonary - Chest tubes with 320 cc of output last 24 hours. Hope to remove soon.No CXR taken this am. Yesterday's exam showed bilateral pleural effusions R>L. Encourage incentive spirometer 3. Volume Overload - Give Lasix 40 IV this am 4.  Acute blood loss anemia - H and H stable at 9.2 and 27. 5. Supplement potassium 6. Consider Toradol to help with pain 7. CBGs 117/117/130. Pre op HGA1C 6. 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 Will stop SS and accu checks 24 hours after transfer to 2000. Will need further surveillance of HGA1C after discharge by medical doctor. 8. Will remove a line when off drips  ZIMMERMAN,DONIELLE MPA-C 06/19/2014,8:04 AM   Chart reviewed, patient examined, agree with above. Wean neo and levophed as BP allows. Hold off on beta blocker until stable off vasopressors.  Remove chest tubes and arterial line. Would hold off on giving Toradol since he is going to  need coumadin for 3 months.  Plan to start coumadin slowly tomorrow.

## 2014-06-19 NOTE — Addendum Note (Signed)
Addendum  created 06/19/14 1000 by Quentin OreLuz E Chitara Clonch, CRNA   Modules edited: Anesthesia Flowsheet

## 2014-06-20 ENCOUNTER — Inpatient Hospital Stay (HOSPITAL_COMMUNITY): Payer: BLUE CROSS/BLUE SHIELD

## 2014-06-20 LAB — URINALYSIS, ROUTINE W REFLEX MICROSCOPIC
Bilirubin Urine: NEGATIVE
GLUCOSE, UA: NEGATIVE mg/dL
HGB URINE DIPSTICK: NEGATIVE
Ketones, ur: NEGATIVE mg/dL
Leukocytes, UA: NEGATIVE
Nitrite: NEGATIVE
Protein, ur: NEGATIVE mg/dL
Specific Gravity, Urine: 1.007 (ref 1.005–1.030)
Urobilinogen, UA: 0.2 mg/dL (ref 0.0–1.0)
pH: 7.5 (ref 5.0–8.0)

## 2014-06-20 LAB — GLUCOSE, CAPILLARY
Glucose-Capillary: 120 mg/dL — ABNORMAL HIGH (ref 70–99)
Glucose-Capillary: 41 mg/dL — CL (ref 70–99)
Glucose-Capillary: 106 mg/dL — ABNORMAL HIGH (ref 70–99)
Glucose-Capillary: 106 mg/dL — ABNORMAL HIGH (ref 70–99)
Glucose-Capillary: 94 mg/dL (ref 70–99)

## 2014-06-20 LAB — CBC
HCT: 24.9 % — ABNORMAL LOW (ref 39.0–52.0)
Hemoglobin: 8.2 g/dL — ABNORMAL LOW (ref 13.0–17.0)
MCH: 29 pg (ref 26.0–34.0)
MCHC: 32.9 g/dL (ref 30.0–36.0)
MCV: 88 fL (ref 78.0–100.0)
Platelets: 73 10*3/uL — ABNORMAL LOW (ref 150–400)
RBC: 2.83 MIL/uL — ABNORMAL LOW (ref 4.22–5.81)
RDW: 13.3 % (ref 11.5–15.5)
WBC: 11.3 10*3/uL — ABNORMAL HIGH (ref 4.0–10.5)

## 2014-06-20 LAB — BASIC METABOLIC PANEL
Anion gap: 4 — ABNORMAL LOW (ref 5–15)
BUN: 10 mg/dL (ref 6–23)
CHLORIDE: 100 meq/L (ref 96–112)
CO2: 30 mmol/L (ref 19–32)
Calcium: 9.1 mg/dL (ref 8.4–10.5)
Creatinine, Ser: 0.76 mg/dL (ref 0.50–1.35)
GFR calc Af Amer: >90 mL/min (ref >90)
Glucose, Bld: 119 mg/dL — ABNORMAL HIGH (ref 70–99)
POTASSIUM: 4 mmol/L (ref 3.5–5.1)
Sodium: 134 mmol/L — ABNORMAL LOW (ref 135–145)

## 2014-06-20 LAB — PROTIME-INR
INR: 1.19 (ref 0.00–1.49)
Prothrombin Time: 15.2 seconds (ref 11.6–15.2)

## 2014-06-20 MED ORDER — FUROSEMIDE 10 MG/ML IJ SOLN
20.0000 mg | Freq: Once | INTRAMUSCULAR | Status: AC
  Administered 2014-06-20: 20 mg via INTRAVENOUS
  Filled 2014-06-20: qty 2

## 2014-06-20 MED ORDER — WARFARIN SODIUM 2.5 MG PO TABS
2.5000 mg | ORAL_TABLET | Freq: Once | ORAL | Status: AC
  Administered 2014-06-20: 2.5 mg via ORAL
  Filled 2014-06-20: qty 1

## 2014-06-20 MED ORDER — POTASSIUM CHLORIDE CRYS ER 20 MEQ PO TBCR
40.0000 meq | EXTENDED_RELEASE_TABLET | Freq: Once | ORAL | Status: AC
  Administered 2014-06-20: 40 meq via ORAL
  Filled 2014-06-20: qty 2

## 2014-06-20 MED ORDER — FERROUS GLUCONATE 324 (38 FE) MG PO TABS
324.0000 mg | ORAL_TABLET | Freq: Two times a day (BID) | ORAL | Status: DC
Start: 2014-06-20 — End: 2014-06-24
  Administered 2014-06-20 – 2014-06-24 (×9): 324 mg via ORAL
  Filled 2014-06-20 (×11): qty 1

## 2014-06-20 MED ORDER — WARFARIN - PHYSICIAN DOSING INPATIENT: Freq: Every day | Status: DC

## 2014-06-20 MED FILL — Potassium Chloride Inj 2 mEq/ML: INTRAVENOUS | Qty: 40 | Status: AC

## 2014-06-20 MED FILL — Magnesium Sulfate Inj 50%: INTRAMUSCULAR | Qty: 10 | Status: AC

## 2014-06-20 MED FILL — Heparin Sodium (Porcine) Inj 1000 Unit/ML: INTRAMUSCULAR | Qty: 30 | Status: AC

## 2014-06-20 NOTE — Progress Notes (Addendum)
      [address]       [place] [id number]             [phone]        3 Days Post-Op Procedure(s) (LRB): AORTIC VALVE REPLACEMENT (AVR) (N/A) MITRAL VALVE REPAIR (MVR) (N/A) TRANSESOPHAGEAL ECHOCARDIOGRAM (TEE) (N/A)  Subjective: Patient "resting this am". No specific complaints.  Objective: Vital signs in last 24 hours: Temp:  [98.5 F (36.9 C)-100.7 F (38.2 C)] 99.1 F (37.3 C) (01/07 0700) Pulse Rate:  [75-118] 93 (01/07 0700) Cardiac Rhythm:  [-] Atrial fibrillation (01/07 0400) Resp:  [13-25] 16 (01/07 0700) BP: (84-127)/(57-78) 103/70 mmHg (01/07 0700) SpO2:  [92 %-99 %] 94 % (01/07 0700) Weight:  [159 lb 6.3 oz (72.3 kg)] 159 lb 6.3 oz (72.3 kg) (01/07 0500)  Pre op weight 66 kg Current Weight  06/20/14 159 lb 6.3 oz (72.3 kg)      Intake/Output from previous day: 01/06 0701 - 01/07 0700 In: 1074.5 [P.O.:540; I.V.:534.5] Out: 3080 [Urine:3040; Chest Tube:40]   Physical Exam:  Cardiovascular: RRR, no murmurs Pulmonary: Diminished at bases R>L; no rales, wheezes, or rhonchi. Abdomen: Soft, non tender, bowel sounds present. Extremities: SCDs in place Wounds: Sternal dressing is clean and dry.  .  Lab Results: CBC:  Recent Labs  06/18/14 1500 06/18/14 1545 06/20/14 0430  WBC 19.0*  --  11.3*  HGB 9.1* 9.2* 8.2*  HCT 26.8* 27.0* 24.9*  PLT 92*  --  73*   BMET:  Recent Labs  06/18/14 0440  06/18/14 1545 06/20/14 0430  NA 138  --  138 134*  K 3.7  --  3.7 4.0  CL 107  --  98 100  CO2 22  --   --  30  GLUCOSE 154*  --  140* 119*  BUN 9  --  12 10  CREATININE 0.94  < > 0.90 0.76  CALCIUM 8.9  --   --  9.1  < > = values in this interval not displayed.  PT/INR:  Lab Results  Component Value Date   INR 1.19 06/20/2014   INR 1.45 06/17/2014   INR 0.97 06/13/2014   ABG:  INR: Will add last result for INR, ABG once components are confirmed Will add last 4 CBG results once components are  confirmed  Assessment/Plan:  1. CV - SR in the 90's. On Neo synephrine and Dopamine drips. Wean drips as tolerates. Will start low dose Coumadin and check PT/INR daily 2.  Pulmonary - Chest tubes removed yesterday. CXR this am shows no pneumothorax, persistent right pleural effusion/atelectasis.Encourage incentive spirometer 3. Volume Overload - Had good response with IV lasix. Will give oral today as BP a little labile. 4.  Acute blood loss anemia - H and H stable at 8.2 and 24.9. 5. Thrombocytopenia-platelets down to 73,000. On Lovenox-will discuss with Dr. Laneta SimmersBartle and also see if should check HIT. Platelets were normal prior to surgery. 7. CBGs 101/147/87. Pre op HGA1C 6. 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 Will stop SS and accu checks 24 hours after transfer to 2000. Will need further surveillance of HGA1C after discharge by medical doctor. 8. Fever to 100.7. May be a combination of atelectasis and SIRS. Will check UA.  ZIMMERMAN,DONIELLE MPA-C 06/20/2014,7:50 AM    Chart reviewed, patient examined, agree with above. Will stop lovenox and follow up platelet count tomorrow. Start iron for anemia. His Hgb is trending down. Coumadin slowly to maintain INR about 2

## 2014-06-20 NOTE — Progress Notes (Signed)
CT surgery p.m. Rounds  Patient had good day, blood pressure stable and dopamine almost weaned off Out of bed on bedside commode currently Able to ambulate Vital signs stable this p.m.

## 2014-06-21 LAB — BASIC METABOLIC PANEL
ANION GAP: 4 — AB (ref 5–15)
BUN: 11 mg/dL (ref 6–23)
CHLORIDE: 100 meq/L (ref 96–112)
CO2: 29 mmol/L (ref 19–32)
CREATININE: 0.68 mg/dL (ref 0.50–1.35)
Calcium: 9 mg/dL (ref 8.4–10.5)
GFR calc Af Amer: >90 mL/min (ref >90)
GLUCOSE: 99 mg/dL (ref 70–99)
Potassium: 3.8 mmol/L (ref 3.5–5.1)
SODIUM: 133 mmol/L — AB (ref 135–145)

## 2014-06-21 LAB — GLUCOSE, CAPILLARY: Glucose-Capillary: 91 mg/dL (ref 70–99)

## 2014-06-21 LAB — CBC
HEMATOCRIT: 22.9 % — AB (ref 39.0–52.0)
Hemoglobin: 7.8 g/dL — ABNORMAL LOW (ref 13.0–17.0)
MCH: 30.4 pg (ref 26.0–34.0)
MCHC: 34.1 g/dL (ref 30.0–36.0)
MCV: 89.1 fL (ref 78.0–100.0)
Platelets: 94 10*3/uL — ABNORMAL LOW (ref 150–400)
RBC: 2.57 MIL/uL — ABNORMAL LOW (ref 4.22–5.81)
RDW: 13.2 % (ref 11.5–15.5)
WBC: 7.1 10*3/uL (ref 4.0–10.5)

## 2014-06-21 LAB — PREPARE RBC (CROSSMATCH)

## 2014-06-21 LAB — PROTIME-INR
INR: 1.15 (ref 0.00–1.49)
Prothrombin Time: 14.8 seconds (ref 11.6–15.2)

## 2014-06-21 MED ORDER — ONDANSETRON HCL 4 MG PO TABS: 4.0000 mg | ORAL_TABLET | Freq: Four times a day (QID) | ORAL | Status: DC | PRN

## 2014-06-21 MED ORDER — ONDANSETRON HCL 4 MG/2ML IJ SOLN
4.0000 mg | Freq: Four times a day (QID) | INTRAMUSCULAR | Status: DC | PRN
Start: 2014-06-21 — End: 2014-06-24

## 2014-06-21 MED ORDER — ADULT MULTIVITAMIN W/MINERALS CH
1.0000 | ORAL_TABLET | Freq: Every day | ORAL | Status: DC
  Administered 2014-06-21 – 2014-06-24 (×4): 1 via ORAL
  Filled 2014-06-21 (×4): qty 1

## 2014-06-21 MED ORDER — TRAMADOL HCL 50 MG PO TABS: 50.0000 mg | ORAL_TABLET | ORAL | Status: DC | PRN

## 2014-06-21 MED ORDER — BISACODYL 5 MG PO TBEC: 10.0000 mg | DELAYED_RELEASE_TABLET | Freq: Every day | ORAL | Status: DC | PRN

## 2014-06-21 MED ORDER — BIOTENE DRY MOUTH MT LIQD
15.0000 mL | Freq: Two times a day (BID) | OROMUCOSAL | Status: DC
  Administered 2014-06-21 – 2014-06-23 (×3): 15 mL via OROMUCOSAL

## 2014-06-21 MED ORDER — SODIUM CHLORIDE 0.9 % IV SOLN: 250.0000 mL | INTRAVENOUS | Status: DC | PRN

## 2014-06-21 MED ORDER — MOVING RIGHT ALONG BOOK
Freq: Once | Status: DC
  Filled 2014-06-21: qty 1

## 2014-06-21 MED ORDER — DOCUSATE SODIUM 100 MG PO CAPS
200.0000 mg | ORAL_CAPSULE | Freq: Every day | ORAL | Status: DC
  Administered 2014-06-22 – 2014-06-24 (×3): 200 mg via ORAL
  Filled 2014-06-21 (×3): qty 2

## 2014-06-21 MED ORDER — PANTOPRAZOLE SODIUM 40 MG PO TBEC
40.0000 mg | DELAYED_RELEASE_TABLET | Freq: Every day | ORAL | Status: DC
  Administered 2014-06-22 – 2014-06-24 (×3): 40 mg via ORAL
  Filled 2014-06-21 (×4): qty 1

## 2014-06-21 MED ORDER — SODIUM CHLORIDE 0.9 % IJ SOLN: 3.0000 mL | INTRAMUSCULAR | Status: DC | PRN

## 2014-06-21 MED ORDER — INFLUENZA VAC SPLIT QUAD 0.5 ML IM SUSY
0.5000 mL | PREFILLED_SYRINGE | INTRAMUSCULAR | Status: DC
  Filled 2014-06-21: qty 0.5

## 2014-06-21 MED ORDER — POTASSIUM CHLORIDE CRYS ER 20 MEQ PO TBCR
20.0000 meq | EXTENDED_RELEASE_TABLET | Freq: Two times a day (BID) | ORAL | Status: DC
  Administered 2014-06-22: 20 meq via ORAL
  Filled 2014-06-21 (×2): qty 1

## 2014-06-21 MED ORDER — WARFARIN SODIUM 2.5 MG PO TABS
2.5000 mg | ORAL_TABLET | Freq: Every day | ORAL | Status: DC
  Administered 2014-06-21: 2.5 mg via ORAL
  Filled 2014-06-21 (×2): qty 1

## 2014-06-21 MED ORDER — SODIUM CHLORIDE 0.9 % IV SOLN
Freq: Once | INTRAVENOUS | Status: AC
  Administered 2014-06-21: 09:00:00 via INTRAVENOUS

## 2014-06-21 MED ORDER — OXYCODONE HCL 5 MG PO TABS
5.0000 mg | ORAL_TABLET | ORAL | Status: DC | PRN
  Administered 2014-06-21: 10 mg via ORAL
  Administered 2014-06-21 – 2014-06-22 (×2): 5 mg via ORAL
  Administered 2014-06-22 – 2014-06-24 (×9): 10 mg via ORAL
  Filled 2014-06-21 (×5): qty 2
  Filled 2014-06-21: qty 1
  Filled 2014-06-21: qty 2
  Filled 2014-06-21: qty 1
  Filled 2014-06-21 (×5): qty 2

## 2014-06-21 MED ORDER — SODIUM CHLORIDE 0.9 % IJ SOLN
3.0000 mL | Freq: Two times a day (BID) | INTRAMUSCULAR | Status: DC
  Administered 2014-06-22 – 2014-06-23 (×2): 3 mL via INTRAVENOUS

## 2014-06-21 MED ORDER — POTASSIUM CHLORIDE CRYS ER 20 MEQ PO TBCR
40.0000 meq | EXTENDED_RELEASE_TABLET | Freq: Once | ORAL | Status: AC
  Administered 2014-06-21: 40 meq via ORAL
  Filled 2014-06-21: qty 2

## 2014-06-21 MED ORDER — FUROSEMIDE 40 MG PO TABS
40.0000 mg | ORAL_TABLET | Freq: Every day | ORAL | Status: DC
  Administered 2014-06-22: 40 mg via ORAL
  Filled 2014-06-21: qty 1

## 2014-06-21 MED ORDER — BISACODYL 10 MG RE SUPP
10.0000 mg | Freq: Every day | RECTAL | Status: DC | PRN
  Administered 2014-06-22: 10 mg via RECTAL
  Filled 2014-06-21: qty 1

## 2014-06-21 MED ORDER — FUROSEMIDE 10 MG/ML IJ SOLN
40.0000 mg | Freq: Once | INTRAMUSCULAR | Status: AC
  Administered 2014-06-21: 40 mg via INTRAVENOUS
  Filled 2014-06-21: qty 4

## 2014-06-21 MED ORDER — VITAMIN B-12 100 MCG PO TABS
100.0000 ug | ORAL_TABLET | Freq: Every day | ORAL | Status: DC
  Administered 2014-06-21 – 2014-06-24 (×4): 100 ug via ORAL
  Filled 2014-06-21 (×4): qty 1

## 2014-06-21 MED ORDER — ACETAMINOPHEN 325 MG PO TABS: 650.0000 mg | ORAL_TABLET | Freq: Four times a day (QID) | ORAL | Status: DC | PRN

## 2014-06-21 NOTE — Progress Notes (Signed)
Patient transferred with belongings, medications, and chart to room 2 west 04 with nurse. Attached to monitor and new nurse at bedside. No new problems at this time.  Russell [name], [name]

## 2014-06-21 NOTE — Progress Notes (Signed)
CARDIAC REHAB PHASE I   PRE:  Rate/Rhythm: 83 SR  BP:  Supine: 118/80  Sitting:   Standing:    SaO2: 96%RA  MODE:  Ambulation: 460 ft   POST:  Rate/Rhythm: 89  BP:  Supine: 130/84  Sitting:   Standing:    SaO2: 96%RA 1320-1400 Pt walked 460 ft on RA with rolling walker and minimal asst. Tolerated well. To bed after walk. Encouraged IS.   Luetta Nuttingharlene Clemens Lachman, RN BSN  06/21/2014 2:14 PM

## 2014-06-21 NOTE — Progress Notes (Signed)
Utilization review completed.  

## 2014-06-21 NOTE — Progress Notes (Signed)
4 Days Post-Op Procedure(s) (LRB): AORTIC VALVE REPLACEMENT (AVR) (N/A) MITRAL VALVE REPAIR (MVR) (N/A) TRANSESOPHAGEAL ECHOCARDIOGRAM (TEE) (N/A) Subjective: No complaints  Objective: Vital signs in last 24 hours: Temp:  [98.4 F (36.9 C)-99.7 F (37.6 C)] 98.4 F (36.9 C) (01/08 0000) Pulse Rate:  [69-101] 73 (01/08 0700) Cardiac Rhythm:  [-] Normal sinus rhythm (01/08 0400) Resp:  [11-23] 20 (01/08 0700) BP: (90-131)/(56-105) 109/82 mmHg (01/08 0700) SpO2:  [92 %-100 %] 97 % (01/08 0700) Weight:  [71.9 kg (158 lb 8.2 oz)] 71.9 kg (158 lb 8.2 oz) (01/08 0500)  Hemodynamic parameters for last 24 hours:    Intake/Output from previous day: 01/07 0701 - 01/08 0700 In: 1674.3 [P.O.:1140; I.V.:534.3] Out: 2327 [Urine:2325; Emesis/NG output:1; Stool:1] Intake/Output this shift:    General appearance: alert and cooperative Neurologic: intact Heart: regular rate and rhythm and systolic flow murmur along RSB Lungs: diminished breath sounds bibasilar Extremities: edema mild Wound: dressing dry  Lab Results:  Recent Labs  06/20/14 0430 06/21/14 0630  WBC 11.3* 7.1  HGB 8.2* 7.8*  HCT 24.9* 22.9*  PLT 73* 94*   BMET:  Recent Labs  06/20/14 0430 06/21/14 0639  NA 134* 133*  K 4.0 3.8  CL 100 100  CO2 30 29  GLUCOSE 119* 99  BUN 10 11  CREATININE 0.76 0.68  CALCIUM 9.1 9.0    PT/INR:  Recent Labs  06/21/14 0638  LABPROT 14.8  INR 1.15   ABG    Component Value Date/Time   PHART 7.432 06/18/2014 1102   HCO3 24.0 06/18/2014 1102   TCO2 23 06/18/2014 1545   ACIDBASEDEF 1.0 06/18/2014 0555   O2SAT 92.0 06/18/2014 1102   CBG (last 3)   Recent Labs  06/20/14 0803 06/20/14 1137 06/20/14 1612  GLUCAP 106* 106* 94    Assessment/Plan: S/P Procedure(s) (LRB): AORTIC VALVE REPLACEMENT (AVR) (N/A) MITRAL VALVE REPAIR (MVR) (N/A) TRANSESOPHAGEAL ECHOCARDIOGRAM (TEE) (N/A)  He is hemodynamically stable off vasopressors and dopamine Acute postop blood  loss anemia: His Hgb is trending down and is 7.8 this am. With reduced LV function and borderline BP I think it would be best to transfuse him a unit of PRBC's. He has no visible blood loss. Will continue iron. Coumadin for MV repair. INR goal 2. Mobilize Diuresis Plan for transfer to step-down: see transfer orders   LOS: 4 days    BARTLE,BRYAN K 06/21/2014

## 2014-06-22 ENCOUNTER — Inpatient Hospital Stay (HOSPITAL_COMMUNITY): Payer: BLUE CROSS/BLUE SHIELD

## 2014-06-22 LAB — CBC
HCT: 28.1 % — ABNORMAL LOW (ref 39.0–52.0)
Hemoglobin: 9.5 g/dL — ABNORMAL LOW (ref 13.0–17.0)
MCH: 30 pg (ref 26.0–34.0)
MCHC: 33.8 g/dL (ref 30.0–36.0)
MCV: 88.6 fL (ref 78.0–100.0)
Platelets: 138 10*3/uL — ABNORMAL LOW (ref 150–400)
RBC: 3.17 MIL/uL — ABNORMAL LOW (ref 4.22–5.81)
RDW: 13 % (ref 11.5–15.5)
WBC: 8.5 10*3/uL (ref 4.0–10.5)

## 2014-06-22 LAB — BASIC METABOLIC PANEL
ANION GAP: 9 (ref 5–15)
BUN: 11 mg/dL (ref 6–23)
CHLORIDE: 98 meq/L (ref 96–112)
CO2: 26 mmol/L (ref 19–32)
CREATININE: 0.8 mg/dL (ref 0.50–1.35)
Calcium: 9.4 mg/dL (ref 8.4–10.5)
GFR calc Af Amer: >90 mL/min (ref >90)
Glucose, Bld: 111 mg/dL — ABNORMAL HIGH (ref 70–99)
Potassium: 3.9 mmol/L (ref 3.5–5.1)
Sodium: 133 mmol/L — ABNORMAL LOW (ref 135–145)

## 2014-06-22 LAB — TYPE AND SCREEN
ABO/RH(D): O POS
ANTIBODY SCREEN: NEGATIVE
Unit division: 0

## 2014-06-22 LAB — PROTIME-INR
INR: 1.07 (ref 0.00–1.49)
Prothrombin Time: 14 seconds (ref 11.6–15.2)

## 2014-06-22 MED ORDER — CARVEDILOL 3.125 MG PO TABS
3.1250 mg | ORAL_TABLET | Freq: Two times a day (BID) | ORAL | Status: DC
  Administered 2014-06-22 – 2014-06-24 (×5): 3.125 mg via ORAL
  Filled 2014-06-22 (×7): qty 1

## 2014-06-22 MED ORDER — WARFARIN SODIUM 5 MG PO TABS
5.0000 mg | ORAL_TABLET | Freq: Every day | ORAL | Status: DC
  Administered 2014-06-22 – 2014-06-23 (×2): 5 mg via ORAL
  Filled 2014-06-22 (×4): qty 1

## 2014-06-22 MED ORDER — SODIUM CHLORIDE 0.9 % IJ SOLN: 10.0000 mL | Freq: Two times a day (BID) | INTRAMUSCULAR | Status: DC

## 2014-06-22 MED ORDER — FUROSEMIDE 40 MG PO TABS
40.0000 mg | ORAL_TABLET | Freq: Two times a day (BID) | ORAL | Status: DC
  Administered 2014-06-22 – 2014-06-24 (×4): 40 mg via ORAL
  Filled 2014-06-22 (×6): qty 1

## 2014-06-22 MED ORDER — SODIUM CHLORIDE 0.9 % IJ SOLN
10.0000 mL | INTRAMUSCULAR | Status: DC | PRN
  Administered 2014-06-23: 30 mL
  Administered 2014-06-24: 10 mL
  Filled 2014-06-22 (×2): qty 40

## 2014-06-22 MED ORDER — POTASSIUM CHLORIDE CRYS ER 10 MEQ PO TBCR
30.0000 meq | EXTENDED_RELEASE_TABLET | Freq: Two times a day (BID) | ORAL | Status: AC
  Administered 2014-06-22 – 2014-06-23 (×4): 30 meq via ORAL
  Filled 2014-06-22 (×4): qty 1

## 2014-06-22 NOTE — Progress Notes (Signed)
1554- Patient ambulated x 2 with family. Will f/u on Monday as needed. Artist Paislinty M Shakema Surita, MS, ACSM CCEP

## 2014-06-22 NOTE — Progress Notes (Addendum)
      [address]       [place] [id number]             [phone]      5 Days Post-Op Procedure(s) (LRB): AORTIC VALVE REPLACEMENT (AVR) (N/A) MITRAL VALVE REPAIR (MVR) (N/A) TRANSESOPHAGEAL ECHOCARDIOGRAM (TEE) (N/A)   Subjective:  Russell [name] states he is doing well for the most part.  He does complain of tenderness around his CVC Line on his left side.  He is ambulating without difficulty.  + BM  Objective: Vital signs in last 24 hours: Temp:  [97.8 F (36.6 C)-99.3 F (37.4 C)] 98.7 F (37.1 C) (01/09 0459) Pulse Rate:  [77-97] 84 (01/09 0459) Cardiac Rhythm:  [-] Normal sinus rhythm (01/08 2010) Resp:  [12-21] 18 (01/09 0459) BP: (114-148)/(73-92) 148/83 mmHg (01/09 0459) SpO2:  [93 %-99 %] 97 % (01/09 0459) Weight:  [155 lb 10.3 oz (70.6 kg)] 155 lb 10.3 oz (70.6 kg) (01/09 0459)  Intake/Output from previous day: 01/08 0701 - 01/09 0700 In: 395 [I.V.:60; Blood:335] Out: 2180 [Urine:2180]  General appearance: alert, cooperative and no distress Heart: regular rate and rhythm Lungs: diminished breath sounds bibasilar Abdomen: soft, non-tender; bowel sounds normal; no masses,  no organomegaly Extremities: edema trace Wound: clean and dry  Lab Results:  Recent Labs  06/21/14 0630 06/22/14 0510  WBC 7.1 8.5  HGB 7.8* 9.5*  HCT 22.9* 28.1*  PLT 94* 138*   BMET:  Recent Labs  06/21/14 0639 06/22/14 0510  NA 133* 133*  K 3.8 3.9  CL 100 98  CO2 29 26  GLUCOSE 99 111*  BUN 11 11  CREATININE 0.68 0.80  CALCIUM 9.0 9.4    PT/INR:  Recent Labs  06/22/14 0510  LABPROT 14.0  INR 1.07   ABG    Component Value Date/Time   PHART 7.432 06/18/2014 1102   HCO3 24.0 06/18/2014 1102   TCO2 23 06/18/2014 1545   ACIDBASEDEF 1.0 06/18/2014 0555   O2SAT 92.0 06/18/2014 1102   CBG (last 3)   Recent Labs  06/20/14 1137 06/20/14 1612 06/21/14 2126  GLUCAP 106* 94 91    Assessment/Plan: S/P Procedure(s) (LRB): AORTIC VALVE  REPLACEMENT (AVR) (N/A) MITRAL VALVE REPAIR (MVR) (N/A) TRANSESOPHAGEAL ECHOCARDIOGRAM (TEE) (N/A)  1. CV- NSR rate in the 80s, blood pressure ranging from 100's-140s- may benefit from low dose Beta Blocker 2. Pulm- small bilateral pleural effusions/atelectasis, continue IS, Lasix 3. Renal- creatinine WNL, remains about 10 lbs above preop weight continue Lasix 4. Expected Blood Loss Anemia- improved after transfusion yesterday, currently at 9.5 5. PT/INR 1.07, has received 2 doses of 2.5 mg, will increase to 5 mg for tonight 6. Dispo- patient is stable, may benefit from low dose beta blocker, increase Coumadin- if get response to INR may be able to d/c home on Monday   LOS: 5 days    BARRETT, ERIN 06/22/2014   Chart reviewed, patient examined, agree with above. He still has significant volume excess with pleural effusions and edema. Will increase lasix to bid. Continue coumadin with goal INR of 2.0 Coreg started today. Will start low dose ACE I prior to discharge. Plan home Monday.

## 2014-06-23 DIAGNOSIS — Z952 Presence of prosthetic heart valve: Secondary | ICD-10-CM

## 2014-06-23 DIAGNOSIS — Z9889 Other specified postprocedural states: Secondary | ICD-10-CM

## 2014-06-23 LAB — PROTIME-INR
INR: 1.13 (ref 0.00–1.49)
Prothrombin Time: 14.7 seconds (ref 11.6–15.2)

## 2014-06-23 NOTE — Progress Notes (Signed)
dc'ed pacing wires pt. tolerated well 

## 2014-06-23 NOTE — Progress Notes (Addendum)
      [address]       [place] [id number]             [phone]      6 Days Post-Op Procedure(s) (LRB): AORTIC VALVE REPLACEMENT (AVR) (N/A) MITRAL VALVE REPAIR (MVR) (N/A) TRANSESOPHAGEAL ECHOCARDIOGRAM (TEE) (N/A)   Subjective:  [name] states he is anxious about going home.  He was given Xanax overnight which he states helped his anxiety.  He states he takes xanax daily.  + ambulation, +BM  Objective: Vital signs in last 24 hours: Temp:  [97.5 F (36.4 C)-99.1 F (37.3 C)] 98.2 F (36.8 C) (01/10 0449) Pulse Rate:  [69-91] 69 (01/10 0700) Cardiac Rhythm:  [-] Normal sinus rhythm (01/09 2025) Resp:  [18-19] 18 (01/10 0449) BP: (116-129)/(79-98) 116/79 mmHg (01/10 0700) SpO2:  [92 %-96 %] 94 % (01/10 0449) Weight:  [151 lb 9.6 oz (68.765 kg)] 151 lb 9.6 oz (68.765 kg) (01/10 0449)  Intake/Output from previous day: 01/09 0701 - 01/10 0700 In: 360 [P.O.:360] Out: -   General appearance: alert, cooperative and no distress Heart: regular rate and rhythm Lungs: diminished breath sounds bibasilar Abdomen: soft, non-tender; bowel sounds normal; no masses,  no organomegaly Extremities: edema trace, improviong Wound: clean and dry  Lab Results:  Recent Labs  06/21/14 0630 06/22/14 0510  WBC 7.1 8.5  HGB 7.8* 9.5*  HCT 22.9* 28.1*  PLT 94* 138*   BMET:  Recent Labs  06/21/14 0639 06/22/14 0510  NA 133* 133*  K 3.8 3.9  CL 100 98  CO2 29 26  GLUCOSE 99 111*  BUN 11 11  CREATININE 0.68 0.80  CALCIUM 9.0 9.4    PT/INR:  Recent Labs  06/23/14 0500  LABPROT 14.7  INR 1.13   ABG    Component Value Date/Time   PHART 7.432 06/18/2014 1102   HCO3 24.0 06/18/2014 1102   TCO2 23 06/18/2014 1545   ACIDBASEDEF 1.0 06/18/2014 0555   O2SAT 92.0 06/18/2014 1102   CBG (last 3)   Recent Labs  06/20/14 1137 06/20/14 1612 06/21/14 2126  GLUCAP 106* 94 91    Assessment/Plan: S/P Procedure(s) (LRB): AORTIC VALVE REPLACEMENT  (AVR) (N/A) MITRAL VALVE REPAIR (MVR) (N/A) TRANSESOPHAGEAL ECHOCARDIOGRAM (TEE) (N/A)  1. CV- NSR rate and pressure controlled with Coreg, will start low dose ACE tomorrow 2. Pulm- off oxygen, some bilateral effusions, continue IS repeat CXR in AM 3. Renal- creatinine WNL, weight down 4 lbs from yesterday, continue Lasix BID 4. PT/INR 1.13- continue Coumadin at 5 mg daily 5. Dispo- patient stable, will diurese today, plan for home in AM   LOS: 6 days    BARRETT, ERIN 06/23/2014   Chart reviewed, patient examined, agree with above. He looks good and should be able to go home in the am if no changes. He will need INR checked in a few days with goal of 2.0 and should go home on Coreg and lisinopril 10.

## 2014-06-23 NOTE — Discharge Instructions (Signed)
Aortic Valve Replacement, Care After °Refer to this sheet in the next few weeks. These instructions provide you with information on caring for yourself after your procedure. Your health care provider may also give you specific instructions. Your treatment has been planned according to current medical practices, but problems sometimes occur. Call your health care provider if you have any problems or questions after your procedure. °HOME CARE INSTRUCTIONS  °· Take medicines only as directed by your health care provider. °· If your health care provider has prescribed elastic stockings, wear them as directed. °· Take frequent naps or rest often throughout the day. °· Avoid lifting over 10 lbs (4.5 kg) or pushing or pulling things with your arms for 6-8 weeks or as directed by your health care provider. °· Avoid driving or airplane travel for 4-6 weeks after surgery or as directed by your health care provider. If you are riding in a car for an extended period, stop every 1-2 hours to stretch your legs. Keep a record of your medicines and medical history with you when traveling. °· Do not drive or operate heavy machinery while taking pain medicine. (narcotics). °· Do not cross your legs. °· Do not use any tobacco products including cigarettes, chewing tobacco, or electronic cigarettes. If you need help quitting, ask your health care provider. °· Do not take baths, swim, or use a hot tub until your health care provider approves. Take showers once your health care provider approves. Pat incisions dry. Do not rub incisions with a washcloth or towel. °· Avoid climbing stairs and using the handrail to pull yourself up for the first 2-3 weeks after surgery. °· Return to work as directed by your health care provider. °· Drink enough fluid to keep your urine clear or pale yellow. °· Do not strain to have a bowel movement. Eat high-fiber foods if you become constipated. You may also take a medicine to help you have a bowel  movement (laxative) as directed by your health care provider. °· Resume sexual activity as directed by your health care provider. Men should not use medicines for erectile dysfunction until their doctor says it is okay. °· If you had a certain type of heart condition in the past, you may need to take antibiotic medicine before having dental work or surgery. Let your dentist and health care providers know if you had one or more of the following: °· Previous endocarditis. °· An artificial (prosthetic) heart valve. °· Congenital heart disease. °SEEK MEDICAL CARE IF: °· You develop a skin rash.   °· You experience sudden changes in your weight. °· You have a fever. °SEEK IMMEDIATE MEDICAL CARE IF:  °· You develop chest pain that is not coming from your incision. °· You have drainage (pus), redness, swelling, or pain at your incision site.   °· You develop shortness of breath or have difficulty breathing.   °· You have increased bleeding from your incision site.   °· You develop light-headedness.   °MAKE SURE YOU:  °· Understand these directions. °· Will watch your condition. °· Will get help right away if you are not doing well or get worse. °Document Released: 12/17/2004 Document Revised: 10/15/2013 Document Reviewed: 03/14/2012 °ExitCare® Patient Information ©2015 [employer]. This information is not intended to replace advice given to you by your health care provider. Make sure you discuss any questions you have with your health care provider. ° °Vitamin K Foods and Warfarin °Warfarin is a medicine that helps prevent harmful blood clots by causing blood to clot more   slowly. It does this by decreasing the activity of vitamin K, which promotes normal blood clotting. For the dose of warfarin you have been prescribed to work well, you need to get about the same amount of vitamin K from your food from day to day. Suddenly getting a lot more vitamin K could cause your blood to clot too quickly. A sudden decrease in  vitamin K intake could cause your blood to clot too slowly. These changes in vitamin K intake could lead to dangerous blood clots or to bleeding. °WHAT GENERAL GUIDELINES DO I NEED TO FOLLOW? °· Keep your intake of vitamin K consistent from day to day. To do this, you must be aware of which foods contain moderate or high amounts of vitamin K. Listed below are some foods that are very high, high, or moderately high in vitamin K. If you eat these foods, make sure you eat a consistent amount of them from day to day.  °· Avoid major changes in your diet, or tell your health care provider before changing your diet. °· If you take a multivitamin that contains vitamin K, be sure to take it every day. °· If you drink green tea, drink the same amount each day. °WHAT FOODS ARE VERY HIGH IN VITAMIN K?  °· Greens, such as Swiss chard and beet, collard, mustard, or turnip greens (fresh or frozen, cooked). °· Kale (fresh or frozen, cooked).   °· Parsley (raw).  °· Spinach (cooked).   °WHAT FOODS ARE HIGH IN VITAMIN K? °· Asparagus (frozen, cooked). °· Beans, green (frozen, cooked). °· Broccoli.   °· Bok choy (cooked).   °· Brussels sprouts (fresh or frozen, cooked).  °· Cabbage (cooked). °· Coleslaw. °WHAT FOODS ARE MODERATELY HIGH IN VITAMIN K? °· Blueberries. °· Black-eyed peas. °· Endive (raw).   °· Green leaf lettuce (raw).   °· Green scallions (raw). °· Kale (raw). °· Okra (frozen, cooked). °· Plantains (fried). °· Romaine lettuce (raw).   °· Sauerkraut (canned).   °· Spinach (raw). °Document Released: 03/28/2009 Document Revised: 06/05/2013 Document Reviewed: 04/04/2013 °ExitCare® Patient Information ©2015 [employer]. This information is not intended to replace advice given to you by your health care provider. Make sure you discuss any questions you have with your health care provider. ° ° °

## 2014-06-23 NOTE — Discharge Summary (Signed)
Physician Discharge Summary  Patient ID: Russell [name] MRN: [id number] DOB/AGE: 17-Sep-1955 59 y.o.  Admit date: 06/17/2014 Discharge date: 06/24/2014   Admission Diagnoses:  Patient Active Problem List   Diagnosis Date Noted  . S/P AVR 06/23/2014  . S/P MVR (mitral valve repair) 06/23/2014  . Aortic stenosis, severe 06/17/2014  . Chest pain 05/16/2014  . Dyspnea 05/16/2014  . Tobacco abuse 05/16/2014  . Pleural effusion 05/16/2014  . Chronic interstitial lung disease 05/16/2014  . Lung nodule 05/16/2014  . Kidney cysts 05/16/2014  . Solitary pulmonary nodule 05/16/2014  . Hypertension   . Anxiety   . Back pain   . Essential hypertension      Discharge Diagnoses:   Patient Active Problem List   Diagnosis Date Noted  . S/P AVR 06/23/2014  . S/P MVR (mitral valve repair) 06/23/2014  . Aortic stenosis, severe 06/17/2014  . Chest pain 05/16/2014  . Dyspnea 05/16/2014  . Tobacco abuse 05/16/2014  . Pleural effusion 05/16/2014  . Chronic interstitial lung disease 05/16/2014  . Lung nodule 05/16/2014  . Kidney cysts 05/16/2014  . Solitary pulmonary nodule 05/16/2014  . Hypertension   . Anxiety   . Back pain   . Essential hypertension    Discharged Condition: good    History of Present Illness:  Russell [name] is a 59 yo white male who presents with a 2-3 month complaint of progressive exertional fatigue, dyspnea, and Aortic Stenosis.  He was recently hospitalized with complaints of chest pain.  During that admission he was found to have severe Aortic Stenosis and moderate to severe Mitral Regurgitation with an EF of 40%.  Workup also included cardiac catheterization which did not show evidence of Coronary Artery Disease.  He was subsequently referred to [facility] and evaluated by Dr. Laneta Simmers on 05/28/2014.  During that visit patient states that his symptoms occur with minimal exertion.  It was felt he would benefit from surgical intervention.  The risks and benefits of the  procedure were explained to the patient and he was agreeable to proceed.      Hospital Course:   Russell [name] presented to [facility] on 06/17/2014.  He was taken to the operating room and underwent Aortic Valve Replacement utilizing a 21 mm Pericardial tissue valve and Mitral Valve Repair using a 26 mm Sorin Memo 3D Ring.  He tolerated the procedure without difficulty and was taken to the SICU in stable condition.  During his stay in the SICU he developed labile blood pressure requiring high dose levophed and albumin to maintain his blood pressure.  He was also on low dose dopamine.  These drips were weaned as patient's blood pressure allowed.  He was diuresed for hypervolemia.  He was extubated on POD #1.  He was started on Coumadin for his Mitral Valve Repair with a goal INR of 2.0.  He developed some thrombocytopenia and his Lovenox was discontinued. The patient continued to progress.  He was medically stable and transferred to the telemetry unit on POD #4.  The patient continues to make progress.  He developed some tachycardia and elevated blood pressure and was started on low dose Beta Blocker.  His INR is slowly trending upward and is currently at 1.13.  He is on 5 mg of Coumadin which we will continue at discharge.  He will require PT/INR check on Wednesday 06/26/2014.  He is maintaining NSR and his pacing wires have been removed without difficulty.  He is ambulating without assistance.  He is  medically stable at this time and will be discharged on 06/24/2014.    Consults: None   Treatments: surgery:   1. Median Sternotomy 2. Extracorporeal circulation 3. Aortic valve replacement using a 21 mm Edwards Magna-Ease pericardial valve. 4. Mitral annuloplasty using a 26 mm Sorin Memo 3D ring.    Disposition: 01-Home or Self Care    Discharge Medications:    Medication List    STOP taking these medications        hydrochlorothiazide 25 MG tablet  Commonly known as:   HYDRODIURIL     HYDROcodone-acetaminophen 5-325 MG per tablet  Commonly known as:  NORCO/VICODIN      TAKE these medications        ALPRAZolam 1 MG tablet  Commonly known as:  XANAX  Take 0.5 mg by mouth 4 (four) times daily as needed for anxiety.     carvedilol 3.125 MG tablet  Commonly known as:  COREG  Take 1 tablet (3.125 mg total) by mouth 2 (two) times daily.     ferrous gluconate 324 MG tablet  Commonly known as:  FERGON  Take 1 tablet (324 mg total) by mouth 2 (two) times daily with a meal.     furosemide 40 MG tablet  Commonly known as:  LASIX  Take 1 tablet (40 mg total) by mouth daily. X 1 week     lisinopril 10 MG tablet  Commonly known as:  PRINIVIL,ZESTRIL  Take 1 tablet (10 mg total) by mouth daily.     MULTI-VITAMIN PO  Take 1 tablet by mouth daily.     oxyCODONE 5 MG immediate release tablet  Commonly known as:  Oxy IR/ROXICODONE  Take 1-2 tablets (5-10 mg total) by mouth every 3 (three) hours as needed for severe pain.     predniSONE 10 MG tablet  Commonly known as:  DELTASONE  Take 20 mg by mouth 2 (two) times daily as needed (flare ups).     VITAMIN B-12 PO  Take 1 tablet by mouth daily.     warfarin 5 MG tablet  Commonly known as:  COUMADIN  Take 5 mg po daily or as directed by the [facility]madin Clinic         Follow Up: Follow-up Information    Follow up with Alleen BorneBARTLE,BRYAN K, MD In 4 weeks.   Specialty:  Cardiothoracic Surgery   Why:  office will contact you with appointment   Contact information:   [address] [place] [address] [phone]       Follow up with [facility] In 4 weeks.   Why:  Please get CXR 1 hour prior to your appointment   Contact information:   [facility]       Follow up with Laqueta LindenKONESWARAN, SURESH A, MD.   Specialty:  Cardiology   Contact information:   [address] [place] [address] [phone]       Follow up with [facility].   Why:  will need to have  PT/INR Drawn on Wednesday 06/26/2014, please contact Dr. Caesar ChestnutKoneswarans office to arrange     The patient has been discharged on:  1.Beta Blocker: Yes [ x ]  No [ ]   If No, reason:    2.Ace Inhibitor/ARB: Yes [ x ]  No [  ]  If No, reason:    3.Statin: Yes [  ]  No [ x]  If No, reason: No CAD   4.Marlowe KaysEcasaValentino Hue: Yes [  ]  No [ x ]  If No, reason: Asa intolerance, on Coumadin  Signed: Zyir Gassert H 06/24/2014, 8:07 AM

## 2014-06-24 ENCOUNTER — Inpatient Hospital Stay (HOSPITAL_COMMUNITY): Payer: BLUE CROSS/BLUE SHIELD

## 2014-06-24 LAB — PROTIME-INR
INR: 1.11 (ref 0.00–1.49)
Prothrombin Time: 14.4 seconds (ref 11.6–15.2)

## 2014-06-24 MED ORDER — FUROSEMIDE 40 MG PO TABS: 40.0000 mg | ORAL_TABLET | Freq: Every day | ORAL | Status: DC

## 2014-06-24 MED ORDER — LISINOPRIL 10 MG PO TABS: 10.0000 mg | ORAL_TABLET | Freq: Every day | ORAL | Status: DC

## 2014-06-24 MED ORDER — OXYCODONE HCL 5 MG PO TABS: 5.0000 mg | ORAL_TABLET | ORAL | Status: DC | PRN

## 2014-06-24 MED ORDER — WARFARIN SODIUM 5 MG PO TABS: ORAL_TABLET | ORAL | Status: DC

## 2014-06-24 MED ORDER — PATIENT'S GUIDE TO USING COUMADIN BOOK
Freq: Once | Status: AC
  Administered 2014-06-24: 12:00:00
  Filled 2014-06-24: qty 1

## 2014-06-24 MED ORDER — LISINOPRIL 10 MG PO TABS
10.0000 mg | ORAL_TABLET | Freq: Every day | ORAL | Status: DC
  Administered 2014-06-24: 10 mg via ORAL
  Filled 2014-06-24: qty 1

## 2014-06-24 MED ORDER — FERROUS GLUCONATE 324 (38 FE) MG PO TABS: 324.0000 mg | ORAL_TABLET | Freq: Two times a day (BID) | ORAL | Status: AC

## 2014-06-24 NOTE — Progress Notes (Signed)
Pt A&O x4; pt discharge education and instructions completed with pt and spouse at bedside. All voices understanding and denies any questions. Pt telemetry removed; pt handed his prescriptions for lasix; oxycodone; coumadin; lisinopril and ferrous gluconate. Pt incision clean, dry and intact with no s/s drainage or infection voice or noted. Pt discharge home with wife to transport him home. Pt transported off unit via wheelchair with belongings and spouse at side. Arabella MerlesP. Amo Jabriel Vanduyne RN.

## 2014-06-24 NOTE — Progress Notes (Signed)
06/24/2014 10:47 AM Nursing note Central line d/c per order and per protocol. Pressure applied x 5 min. Occlusive dressing applied. Pt. Tolerated well. Advised br x 30 min. Primary RN updated on patient. Condition.  March Joos, Blanchard KelchStephanie Ingold

## 2014-06-24 NOTE — Progress Notes (Signed)
       [address]       [place] [id number]             [phone]          7 Days Post-Op Procedure(s) (LRB): AORTIC VALVE REPLACEMENT (AVR) (N/A) MITRAL VALVE REPAIR (MVR) (N/A) TRANSESOPHAGEAL ECHOCARDIOGRAM (TEE) (N/A)  Subjective: Feels well, no complaints.   Objective: Vital signs in last 24 hours: Patient Vitals for the past 24 hrs:  BP Temp Temp src Pulse Resp SpO2 Weight  06/24/14 0512 119/80 mmHg 97.7 F (36.5 C) Oral 80 18 94 % -  06/24/14 0332 - - - - - - 144 lb 6.4 oz (65.499 kg)  06/23/14 1948 (!) 127/93 mmHg 98.7 F (37.1 C) Oral 85 18 92 % -  06/23/14 1600 121/74 mmHg - - 84 - - -  06/23/14 1500 120/78 mmHg - - 80 - - -  06/23/14 1445 (!) 118/53 mmHg - - 82 - - -  06/23/14 1430 123/81 mmHg - - 88 - - -  06/23/14 1415 109/60 mmHg - - 81 20 94 % -  06/23/14 1408 120/86 mmHg 97.8 F (36.6 C) Oral 74 18 92 % -   Current Weight  06/24/14 144 lb 6.4 oz (65.499 kg)  BASELINE WEIGHT: 66 kg   Intake/Output from previous day: 01/10 0701 - 01/11 0700 In: 480 [P.O.:480] Out: -     PHYSICAL EXAM:  Heart: RRR Lungs: Clear Wound: Clean and dry Extremities: Mild LE edema    Lab Results: CBC: Recent Labs  06/22/14 0510  WBC 8.5  HGB 9.5*  HCT 28.1*  PLT 138*   BMET:  Recent Labs  06/22/14 0510  NA 133*  K 3.9  CL 98  CO2 26  GLUCOSE 111*  BUN 11  CREATININE 0.80  CALCIUM 9.4    PT/INR:  Recent Labs  06/24/14 0424  LABPROT 14.4  INR 1.11      Assessment/Plan: S/P Procedure(s) (LRB): AORTIC VALVE REPLACEMENT (AVR) (N/A) MITRAL VALVE REPAIR (MVR) (N/A) TRANSESOPHAGEAL ECHOCARDIOGRAM (TEE) (N/A) CV- stable, SR. Continue Coreg, will start low dose lisinopril.  Continue Coumadin load. Vol overload- continue diuresis. Plan d/c home today- instructions reviewed with patient.    LOS: 7 days    COLLINS,GINA H 06/24/2014

## 2014-06-24 NOTE — Progress Notes (Addendum)
[id number] Education completed with pt and wife who voiced understanding. Encouraged IS. Discussed with pt and wife that HGA1C was 6.0 and pt needed to watch carbs and have Medical Doctor keep check on this. Pt stated his mother had diabetes and brother borderline. Pt stated he sees his MD every 3 months. Discussed CRP 2 but pt declined at this time due to high copay. Stated would discuss with cardiologist if he changes his mind. Put on discharge video to be watched.  Pt has not smoked since end of November and used nicorette gum.  Gave smoking cessation  Handout and encouraged no smoking. Luetta NuttingCharlene Adriona Kaney RN BSN 06/24/2014 10:10 AM

## 2014-06-26 ENCOUNTER — Ambulatory Visit (INDEPENDENT_AMBULATORY_CARE_PROVIDER_SITE_OTHER): Payer: BLUE CROSS/BLUE SHIELD | Admitting: *Deleted

## 2014-06-26 DIAGNOSIS — Z954 Presence of other heart-valve replacement: Secondary | ICD-10-CM

## 2014-06-26 DIAGNOSIS — Z952 Presence of prosthetic heart valve: Secondary | ICD-10-CM

## 2014-06-26 DIAGNOSIS — Z1212 Encounter for screening for malignant neoplasm of rectum: Secondary | ICD-10-CM | POA: Insufficient documentation

## 2014-06-26 DIAGNOSIS — Z9889 Other specified postprocedural states: Secondary | ICD-10-CM

## 2014-06-26 DIAGNOSIS — Z5181 Encounter for therapeutic drug level monitoring: Secondary | ICD-10-CM

## 2014-06-26 LAB — POCT INR: INR: 1.3

## 2014-07-02 ENCOUNTER — Ambulatory Visit (INDEPENDENT_AMBULATORY_CARE_PROVIDER_SITE_OTHER): Payer: BLUE CROSS/BLUE SHIELD | Admitting: *Deleted

## 2014-07-02 ENCOUNTER — Encounter: Payer: Self-pay | Admitting: Cardiovascular Disease

## 2014-07-02 ENCOUNTER — Ambulatory Visit (INDEPENDENT_AMBULATORY_CARE_PROVIDER_SITE_OTHER): Payer: BLUE CROSS/BLUE SHIELD | Admitting: Cardiovascular Disease

## 2014-07-02 VITALS — BP 106/74 | HR 80 | Ht 67.0 in | Wt 139.0 lb

## 2014-07-02 DIAGNOSIS — I429 Cardiomyopathy, unspecified: Secondary | ICD-10-CM

## 2014-07-02 DIAGNOSIS — Z5181 Encounter for therapeutic drug level monitoring: Secondary | ICD-10-CM

## 2014-07-02 DIAGNOSIS — I5189 Other ill-defined heart diseases: Secondary | ICD-10-CM

## 2014-07-02 DIAGNOSIS — Z87898 Personal history of other specified conditions: Secondary | ICD-10-CM

## 2014-07-02 DIAGNOSIS — Z954 Presence of other heart-valve replacement: Secondary | ICD-10-CM

## 2014-07-02 DIAGNOSIS — Z952 Presence of prosthetic heart valve: Secondary | ICD-10-CM

## 2014-07-02 DIAGNOSIS — Z9889 Other specified postprocedural states: Secondary | ICD-10-CM

## 2014-07-02 DIAGNOSIS — Z9289 Personal history of other medical treatment: Secondary | ICD-10-CM

## 2014-07-02 DIAGNOSIS — R0789 Other chest pain: Secondary | ICD-10-CM

## 2014-07-02 DIAGNOSIS — I519 Heart disease, unspecified: Secondary | ICD-10-CM

## 2014-07-02 DIAGNOSIS — I350 Nonrheumatic aortic (valve) stenosis: Secondary | ICD-10-CM

## 2014-07-02 DIAGNOSIS — Z720 Tobacco use: Secondary | ICD-10-CM

## 2014-07-02 LAB — POCT INR: INR: 2.5

## 2014-07-02 MED ORDER — FUROSEMIDE 20 MG PO TABS: 20.0000 mg | ORAL_TABLET | Freq: Every day | ORAL | Status: DC

## 2014-07-02 MED ORDER — LISINOPRIL 5 MG PO TABS: 5.0000 mg | ORAL_TABLET | Freq: Every day | ORAL | Status: DC

## 2014-07-02 MED ORDER — OXYCODONE HCL 5 MG PO TABS: 5.0000 mg | ORAL_TABLET | Freq: Three times a day (TID) | ORAL | Status: DC | PRN

## 2014-07-02 NOTE — Progress Notes (Signed)
Patient ID: Russell GuthrieCharles R [name], male   DOB: 16-Oct-1955, 59 y.o.   MRN: [id number]      SUBJECTIVE: The patient returns for follow up after undergoing aortic valve replacement using a 21 mm Edwards Magna-Ease pericardial valve and mitral annuloplasty using a 26 mm Sorin Memo 3D ring on 06/17/14. He is feeling much better and has been walking daily and is trying to increase his time walking by 1 minute each day, and is up to 40 minutes. He monitors his blood pressure, temperature, and heart rate daily and records it although he forgot to bring in his log. He has occasional chest pains and feels somewhat fatigued. His blood pressure is 106/74. Overall he is feeling very well. He denies leg swelling and had been on Lasix 40 mg daily but has no refills. He said the oxycodone worked better than the Norco for chest wall pain.   Review of Systems: As per "subjective", otherwise negative.  Allergies  Allergen Reactions  . Asa [Aspirin] Diarrhea and Nausea And Vomiting  . Prozac [Fluoxetine Hcl] Other (See Comments)    confusion    Current Outpatient Prescriptions  Medication Sig Dispense Refill  . ALPRAZolam (XANAX) 1 MG tablet Take 0.5 mg by mouth 4 (four) times daily as needed for anxiety.    . carvedilol (COREG) 3.125 MG tablet Take 1 tablet (3.125 mg total) by mouth 2 (two) times daily. 180 tablet 3  . docusate sodium (COLACE) 100 MG capsule Take 100 mg by mouth daily.    . ferrous gluconate (FERGON) 324 MG tablet Take 1 tablet (324 mg total) by mouth 2 (two) times daily with a meal. 60 tablet 1  . HYDROcodone-acetaminophen (NORCO) 10-325 MG per tablet     . lisinopril (PRINIVIL,ZESTRIL) 10 MG tablet Take 1 tablet (10 mg total) by mouth daily. 30 tablet 1  . Multiple Vitamin (MULTI-VITAMIN PO) Take 1 tablet by mouth daily.    Marland Kitchen. warfarin (COUMADIN) 5 MG tablet Take 5 mg po daily or as directed by the [facility] 60 tablet 1   No current facility-administered medications for this visit.     Past Medical History  Diagnosis Date  . Essential hypertension   . Anxiety   . Back pain   . Cervical disc disease     C6-7  . Heart murmur   . Sleep apnea     per wife, no sleep study  . Shortness of breath dyspnea   . COPD (chronic obstructive pulmonary disease)   . Chronic kidney disease     kidney cyst  . Arthritis   . Lung nodules     Past Surgical History  Procedure Laterality Date  . Back surgery    . Right knee surgery      Cartilage removed  . Colonoscopy  10/19/2011    Procedure: COLONOSCOPY;  Surgeon: Dalia HeadingMark A Jenkins, MD;  [facility];  Service: Gastroenterology;  Laterality: N/A;  . Left and right heart catheterization with coronary angiogram N/A 05/23/2014    Procedure: LEFT AND RIGHT HEART CATHETERIZATION WITH CORONARY ANGIOGRAM;  Surgeon: Corky CraftsJayadeep S Varanasi, MD;  [facility];  Service: Cardiovascular;  Laterality: N/A;  . Cardiac catheterization  05/23/14  . Aortic valve replacement N/A 06/17/2014    Procedure: AORTIC VALVE REPLACEMENT (AVR);  Surgeon: Alleen BorneBryan K Bartle, MD;  [facility];  Service: Open Heart Surgery;  Laterality: N/A;  . Mitral valve repair N/A 06/17/2014    Procedure: MITRAL VALVE REPAIR (MVR);  Surgeon:  Alleen Borne, MD;  Location: [facility] OR;  Service: Open Heart Surgery;  Laterality: N/A;  . Tee without cardioversion N/A 06/17/2014    Procedure: TRANSESOPHAGEAL ECHOCARDIOGRAM (TEE);  Surgeon: Alleen Borne, MD;  Location: [facility] OR;  Service: Open Heart Surgery;  Laterality: N/A;    History   Social History  . Marital Status: Married    Spouse Name: N/A    Number of Children: N/A  . Years of Education: N/A   Occupational History  . Not on file.   Social History Main Topics  . Smoking status: Former Smoker -- 1.50 packs/day for 15 years    Types: Cigarettes    Start date: 06/22/1968    Quit date: 05/08/2014  . Smokeless tobacco: Never Used  . Alcohol Use: No  . Drug Use: No  . Sexual Activity: Yes   Other  Topics Concern  . Not on file   Social History Narrative     Filed Vitals:   07/02/14 1320  BP: 106/74  Pulse: 80  Height:  (1.702 m)  Weight: 139 lb (63.05 kg)    PHYSICAL EXAM General: NAD HEENT: Normal. Neck: No JVD, no thyromegaly. Lungs: Clear to auscultation bilaterally with normal respiratory effort. CV: Nondisplaced PMI.  Well healed midline incision. Regular rate and rhythm, normal S1/S2, no S3/S4, 2/6 systolic murmur heard throughout precordium. No pretibial or periankle edema.      Abdomen: Soft, nontender, no hepatosplenomegaly, no distention.  Neurologic: Alert and oriented x 3.  Psych: Normal affect. Skin: Normal. Musculoskeletal: Normal range of motion, no gross deformities. Extremities: No clubbing or cyanosis.   ECG: Most recent ECG reviewed.      ASSESSMENT AND PLAN: 1. S/p AVR and MV annuloplasty: Stable. Will repeat echocardiogram within next several months to assess both valve integrity and for LV systolic function improvement. Will make referral to cardiac rehabilitation. Will prescribe oxycodone 5 mg q 8 hours prn chest wall pain (20 tablets, no refills). 2. Cardiomyopathy with severe LV dysfunction and severe LV dilatation in the context of severe aortic stenosis, moderate aortic regurgitation, and moderate to severe mitral regurgitation: He is on low-dose Coreg and lisinopril, and my hope is that ventricular function will improve now that he has undergone both AVR and MV repair. No evidence of heart failure. Due to fatigue and low normal BP, will reduce lisinopril to 5 mg daily. Will prescribe 20 mg Lasix daily. 3. Essential HTN: Low normal. Will reduce lisinopril to 5 mg daily and continue Coreg 3.125 mg bid. 4. Tobacco abuse disorder: He has since quit and rarely chews Nicorette gum.  Dispo: f/u 3 monhts.   Prentice Docker, M.D., F.A.C.C.

## 2014-07-02 NOTE — Patient Instructions (Addendum)
Your physician recommends that you schedule a follow-up appointment in: 3 months with Dr. Purvis SheffieldKoneswaran   Your physician has recommended you make the following change in your medication:   STOP TAKING NORCO  START LASIX 20 MG DAILY  DECREASE LISINOPRIL 5 MG DAILY  START OXYCODONE 5 MG EVER 8 HOURS AS NEEDED. WE HAVE GIVEN YOU RX FOR 20 WITH NO REFILLS  You have been referred to CARDIAC REHAB

## 2014-07-04 ENCOUNTER — Ambulatory Visit (INDEPENDENT_AMBULATORY_CARE_PROVIDER_SITE_OTHER): Payer: Self-pay

## 2014-07-04 DIAGNOSIS — I350 Nonrheumatic aortic (valve) stenosis: Secondary | ICD-10-CM

## 2014-07-04 DIAGNOSIS — Z4802 Encounter for removal of sutures: Secondary | ICD-10-CM

## 2014-07-04 NOTE — Progress Notes (Signed)
Removed 2 sutures from chest tube sites. No signs of infection and patient tolerated well. 

## 2014-07-09 ENCOUNTER — Ambulatory Visit (INDEPENDENT_AMBULATORY_CARE_PROVIDER_SITE_OTHER): Payer: BLUE CROSS/BLUE SHIELD | Admitting: *Deleted

## 2014-07-09 DIAGNOSIS — Z954 Presence of other heart-valve replacement: Secondary | ICD-10-CM

## 2014-07-09 DIAGNOSIS — Z952 Presence of prosthetic heart valve: Secondary | ICD-10-CM

## 2014-07-09 DIAGNOSIS — Z9889 Other specified postprocedural states: Secondary | ICD-10-CM

## 2014-07-09 DIAGNOSIS — Z5181 Encounter for therapeutic drug level monitoring: Secondary | ICD-10-CM

## 2014-07-09 LAB — POCT INR: INR: 2.3

## 2014-07-11 ENCOUNTER — Encounter (HOSPITAL_COMMUNITY): Payer: Self-pay

## 2014-07-11 ENCOUNTER — Encounter (HOSPITAL_COMMUNITY)
Admission: RE | Admit: 2014-07-11 | Discharge: 2014-07-11 | Disposition: A | Discharge status: Home / Self Care | Payer: BLUE CROSS/BLUE SHIELD | Source: Ambulatory Visit | Attending: Cardiovascular Disease | Admitting: Cardiovascular Disease

## 2014-07-11 VITALS — BP 106/74 | HR 82 | Ht 67.0 in | Wt 142.5 lb

## 2014-07-11 DIAGNOSIS — Z952 Presence of prosthetic heart valve: Secondary | ICD-10-CM | POA: Insufficient documentation

## 2014-07-11 DIAGNOSIS — I350 Nonrheumatic aortic (valve) stenosis: Secondary | ICD-10-CM | POA: Insufficient documentation

## 2014-07-11 NOTE — Progress Notes (Signed)
Pt has finished orientation and is scheduled to start CR on Friday, July 12, 2014 at 0815. Pt has been instructed to arrive to class 15 minutes early for scheduled class. Pt has been instructed to wear comfortable clothing and shoes with rubber soles. Pt has been told to take their medications 1 hour prior to coming to class.  If the patient is not going to attend class, he has been instructed to call.

## 2014-07-11 NOTE — Progress Notes (Signed)
Patient referred to Cardiac Rehab by Dr. Purvis SheffieldKoneswaran due to Aortic Stenosis, I35.0.   Dr. Purvis SheffieldKoneswaran is his cardiologist and Dr. Sudie BaileyKnowlton is his PCP.  During orientation advised patient on arrival and appointment times what to wear, what to do before, during and after exercise.  Reviewed attendance and class policy.  Talked about inclement weather and class consultation policy. Patient is scheduled to start cardiac Rehab on Friday, July 12, 2014 at 0815.  Patient was advised to come to class 5 minutes before class starts.  He was also given instructions on meeting with the dietician and attending the Family Structure classes. Pt is eager to get started.  Pt was able to complete 6 min walk test.

## 2014-07-12 ENCOUNTER — Encounter (HOSPITAL_COMMUNITY)
Admission: RE | Admit: 2014-07-12 | Discharge: 2014-07-12 | Disposition: A | Discharge status: Home / Self Care | Payer: BLUE CROSS/BLUE SHIELD | Source: Ambulatory Visit | Attending: Cardiovascular Disease | Admitting: Cardiovascular Disease

## 2014-07-12 DIAGNOSIS — Z952 Presence of prosthetic heart valve: Secondary | ICD-10-CM | POA: Diagnosis not present

## 2014-07-12 DIAGNOSIS — I350 Nonrheumatic aortic (valve) stenosis: Secondary | ICD-10-CM | POA: Diagnosis not present

## 2014-07-15 ENCOUNTER — Encounter (HOSPITAL_COMMUNITY)
Admission: RE | Admit: 2014-07-15 | Discharge: 2014-07-15 | Disposition: A | Discharge status: Home / Self Care | Payer: BLUE CROSS/BLUE SHIELD | Source: Ambulatory Visit | Attending: Cardiovascular Disease | Admitting: Cardiovascular Disease

## 2014-07-15 ENCOUNTER — Telehealth: Payer: Self-pay | Admitting: Cardiovascular Disease

## 2014-07-15 DIAGNOSIS — Z952 Presence of prosthetic heart valve: Secondary | ICD-10-CM | POA: Diagnosis not present

## 2014-07-15 DIAGNOSIS — I350 Nonrheumatic aortic (valve) stenosis: Secondary | ICD-10-CM | POA: Insufficient documentation

## 2014-07-15 NOTE — Telephone Encounter (Signed)
Patient's wife states that patient has been having irregular heart rate / tgs

## 2014-07-15 NOTE — Telephone Encounter (Signed)
Wife called stating husbands HR is from 40-110,noted also cardiac rehab today,requests to be seen.Apt tomorrow with K.lawrence NP at 2:50 pm

## 2014-07-16 ENCOUNTER — Ambulatory Visit (INDEPENDENT_AMBULATORY_CARE_PROVIDER_SITE_OTHER): Payer: BLUE CROSS/BLUE SHIELD | Admitting: Cardiovascular Disease

## 2014-07-16 ENCOUNTER — Ambulatory Visit: Payer: BC Managed Care – PPO | Admitting: Adult Health

## 2014-07-16 ENCOUNTER — Encounter: Payer: Self-pay | Admitting: Cardiovascular Disease

## 2014-07-16 VITALS — BP 132/92 | HR 46 | Ht 67.0 in | Wt 146.0 lb

## 2014-07-16 DIAGNOSIS — I493 Ventricular premature depolarization: Secondary | ICD-10-CM

## 2014-07-16 DIAGNOSIS — R531 Weakness: Secondary | ICD-10-CM

## 2014-07-16 DIAGNOSIS — I10 Essential (primary) hypertension: Secondary | ICD-10-CM

## 2014-07-16 DIAGNOSIS — Z720 Tobacco use: Secondary | ICD-10-CM

## 2014-07-16 DIAGNOSIS — E876 Hypokalemia: Secondary | ICD-10-CM

## 2014-07-16 DIAGNOSIS — I5189 Other ill-defined heart diseases: Secondary | ICD-10-CM

## 2014-07-16 DIAGNOSIS — D508 Other iron deficiency anemias: Secondary | ICD-10-CM

## 2014-07-16 DIAGNOSIS — Z954 Presence of other heart-valve replacement: Secondary | ICD-10-CM

## 2014-07-16 DIAGNOSIS — Z9889 Other specified postprocedural states: Secondary | ICD-10-CM

## 2014-07-16 DIAGNOSIS — I429 Cardiomyopathy, unspecified: Secondary | ICD-10-CM

## 2014-07-16 DIAGNOSIS — Z952 Presence of prosthetic heart valve: Secondary | ICD-10-CM

## 2014-07-16 DIAGNOSIS — J438 Other emphysema: Secondary | ICD-10-CM

## 2014-07-16 DIAGNOSIS — R001 Bradycardia, unspecified: Secondary | ICD-10-CM

## 2014-07-16 DIAGNOSIS — I519 Heart disease, unspecified: Secondary | ICD-10-CM

## 2014-07-16 LAB — BASIC METABOLIC PANEL
BUN: 11 mg/dL (ref 6–23)
CALCIUM: 10.2 mg/dL (ref 8.4–10.5)
CHLORIDE: 103 meq/L (ref 96–112)
CO2: 28 mEq/L (ref 19–32)
CREATININE: 0.96 mg/dL (ref 0.50–1.35)
Glucose, Bld: 64 mg/dL — ABNORMAL LOW (ref 70–99)
Potassium: 4.9 mEq/L (ref 3.5–5.3)
SODIUM: 139 meq/L (ref 135–145)

## 2014-07-16 LAB — CBC WITH DIFFERENTIAL/PLATELET
BASOS ABS: 0.1 10*3/uL (ref 0.0–0.1)
BASOS PCT: 1 % (ref 0–1)
Eosinophils Absolute: 0.5 10*3/uL (ref 0.0–0.7)
Eosinophils Relative: 5 % (ref 0–5)
HCT: 39 % (ref 39.0–52.0)
Hemoglobin: 12.8 g/dL — ABNORMAL LOW (ref 13.0–17.0)
LYMPHS PCT: 35 % (ref 12–46)
Lymphs Abs: 3.2 10*3/uL (ref 0.7–4.0)
MCH: 28.4 pg (ref 26.0–34.0)
MCHC: 32.8 g/dL (ref 30.0–36.0)
MCV: 86.5 fL (ref 78.0–100.0)
MPV: 11 fL (ref 8.6–12.4)
Monocytes Absolute: 0.8 10*3/uL (ref 0.1–1.0)
Monocytes Relative: 9 % (ref 3–12)
NEUTROS ABS: 4.5 10*3/uL (ref 1.7–7.7)
Neutrophils Relative %: 50 % (ref 43–77)
Platelets: 231 10*3/uL (ref 150–400)
RBC: 4.51 MIL/uL (ref 4.22–5.81)
RDW: 13.7 % (ref 11.5–15.5)
WBC: 9 10*3/uL (ref 4.0–10.5)

## 2014-07-16 LAB — HEPATIC FUNCTION PANEL
ALK PHOS: 85 U/L (ref 39–117)
ALT: 18 U/L (ref 0–53)
AST: 21 U/L (ref 0–37)
Albumin: 4 g/dL (ref 3.5–5.2)
BILIRUBIN TOTAL: 0.3 mg/dL (ref 0.2–1.2)
Bilirubin, Direct: 0.1 mg/dL (ref 0.0–0.3)
Indirect Bilirubin: 0.2 mg/dL (ref 0.2–1.2)
Total Protein: 6.8 g/dL (ref 6.0–8.3)

## 2014-07-16 LAB — MAGNESIUM: Magnesium: 1.8 mg/dL (ref 1.5–2.5)

## 2014-07-16 MED ORDER — POTASSIUM CHLORIDE CRYS ER 20 MEQ PO TBCR: 20.0000 meq | EXTENDED_RELEASE_TABLET | Freq: Every day | ORAL | Status: DC

## 2014-07-16 NOTE — Patient Instructions (Signed)
Your physician recommends that you schedule a follow-up appointment in: 3 Months with Dr. Dwyane LuoKoneswarn   Your physician recommends that you return for lab work in: ( BMet, CBC, Magnesium)  Your physician recommends that you continue on your current medications as directed. Please refer to the Current Medication list given to you today.  Thank you for choosing [facility]!

## 2014-07-16 NOTE — Progress Notes (Signed)
Patient ID: Russell GuthrieCharles R [name], male   DOB: [birth year], 59 y.o.   MRN: [id number]      SUBJECTIVE: When I last saw Mr. Russell BullocksJarrell on 1/19, he had been doing well. He has been participating in cardiac rehabilitation and progressing quite well. He underwent aortic valve replacement using a 21 mm Edwards Magna-Ease pericardial valve and mitral annuloplasty using a 26 mm Sorin Memo 3D ring on 06/17/14. His wife called in complaining her husband's HR has been running from 40-110 bpm. He has been feeling physically well, but has had a lot of stress and anxiety lately, as someone close to he and his wife passed away. They also have financial difficulties which worry him. He says he is easily stressed by nature. Our monitor registered a HR of 46 bpm in the office. However, ECG demonstrates sinus rhythm with frequent PVC's and LVH with repolarization abnormalities, HR 107 bpm. He said he has been hypokalemic in the recent past and required supplementation. On 06/22/14, Na 133, Hgb 9.5. He received a PRBC transfusion shortly thereafter.   Review of Systems: As per "subjective", otherwise negative.  Allergies  Allergen Reactions  . Asa [Aspirin] Diarrhea and Nausea And Vomiting  . Prozac [Fluoxetine Hcl] Other (See Comments)    confusion    Current Outpatient Prescriptions  Medication Sig Dispense Refill  . ALPRAZolam (XANAX) 1 MG tablet Take 0.5 mg by mouth 4 (four) times daily as needed for anxiety.    . carvedilol (COREG) 3.125 MG tablet Take 1 tablet (3.125 mg total) by mouth 2 (two) times daily. 180 tablet 3  . docusate sodium (COLACE) 100 MG capsule Take 100 mg by mouth daily.    . ferrous gluconate (FERGON) 324 MG tablet Take 1 tablet (324 mg total) by mouth 2 (two) times daily with a meal. 60 tablet 1  . furosemide (LASIX) 20 MG tablet Take 1 tablet (20 mg total) by mouth daily. 90 tablet 3  . HYDROcodone-acetaminophen (NORCO) 10-325 MG per tablet Take 1 tablet by mouth every 6 (six) hours as  needed.    Marland Kitchen. lisinopril (PRINIVIL,ZESTRIL) 5 MG tablet Take 1 tablet (5 mg total) by mouth daily. 90 tablet 3  . Multiple Vitamin (MULTI-VITAMIN PO) Take 1 tablet by mouth daily.     Marland Kitchen. warfarin (COUMADIN) 5 MG tablet Take 5 mg po daily or as directed by the [facility] 60 tablet 1   No current facility-administered medications for this visit.    Past Medical History  Diagnosis Date  . Essential hypertension   . Anxiety   . Back pain   . Cervical disc disease     C6-7  . Heart murmur   . Sleep apnea     per wife, no sleep study  . Shortness of breath dyspnea   . COPD (chronic obstructive pulmonary disease)   . Chronic kidney disease     kidney cyst  . Arthritis   . Lung nodules     Past Surgical History  Procedure Laterality Date  . Back surgery    . Right knee surgery      Cartilage removed  . Colonoscopy  10/19/2011    Procedure: COLONOSCOPY;  Surgeon: Dalia HeadingMark A Jenkins, MD;  [facility]: [facility];  Service: Gastroenterology;  Laterality: N/A;  . Left and right heart catheterization with coronary angiogram N/A 05/23/2014    Procedure: LEFT AND RIGHT HEART CATHETERIZATION WITH CORONARY ANGIOGRAM;  Surgeon: Corky CraftsJayadeep S Varanasi, MD;  [facility];  Service: Cardiovascular;  Laterality: N/A;  . Cardiac catheterization  05/23/14  . Aortic valve replacement N/A 06/17/2014    Procedure: AORTIC VALVE REPLACEMENT (AVR);  Surgeon: Alleen Borne, MD;  Location: [facility] OR;  Service: Open Heart Surgery;  Laterality: N/A;  . Mitral valve repair N/A 06/17/2014    Procedure: MITRAL VALVE REPAIR (MVR);  Surgeon: Alleen Borne, MD;  Location: [facility] OR;  Service: Open Heart Surgery;  Laterality: N/A;  . Tee without cardioversion N/A 06/17/2014    Procedure: TRANSESOPHAGEAL ECHOCARDIOGRAM (TEE);  Surgeon: Alleen Borne, MD;  Location[facility] OR;  Service: Open Heart Surgery;  Laterality: N/A;    History   Social History  . Marital Status: Married    Spouse Name: N/A    Number of Children:  N/A  . Years of Education: N/A   Occupational History  . Not on file.   Social History Main Topics  . Smoking status: Former Smoker -- 1.50 packs/day for 15 years    Types: Cigarettes    Start date: 06/22/1968    Quit date: 05/08/2014  . Smokeless tobacco: Never Used  . Alcohol Use: No  . Drug Use: No  . Sexual Activity: Yes   Other Topics Concern  . Not on file   Social History Narrative     Filed Vitals:   07/16/14 1136  BP: 132/92  Pulse: 46  Height:  (1.702 m)  Weight: 146 lb (66.225 kg)    PHYSICAL EXAM General: NAD HEENT: Normal. Neck: No JVD, no thyromegaly. Lungs: Clear to auscultation bilaterally with normal respiratory effort. CV: Nondisplaced PMI. Well healed midline incision. Regular rate and rhythm, normal S1/S2, no S3/S4, 2/6 systolic murmur heard throughout precordium. No pretibial or periankle edema.  Abdomen: Soft, nontender, no hepatosplenomegaly, no distention.  Neurologic: Alert and oriented x 3.  Psych: Normal affect. Skin: Normal. Musculoskeletal: Normal range of motion, no gross deformities. Extremities: No clubbing or cyanosis.   ECG: Most recent ECG reviewed.     ASSESSMENT AND PLAN: 1. S/p AVR and MV annuloplasty: Stable. Will repeat echocardiogram within next several months to assess both valve integrity and for LV systolic function improvement. He will continue with cardiac rehabilitation.  2. Cardiomyopathy with severe LV dysfunction and severe LV dilatation in the context of severe aortic stenosis, moderate aortic regurgitation, and moderate to severe mitral regurgitation: He is on low-dose Coreg and lisinopril, and my hope is that ventricular function will improve now that he has undergone both AVR and MV repair. No evidence of heart failure. Will continue 20 mg Lasix daily. 3. Essential HTN: Reasonably controlled today. Will need continued monitoring. Continue lisinopril 5 mg daily. 4. Tobacco abuse disorder: He has  since quit and rarely chews Nicorette gum. 5. Frequent PVC's: He is not actually bradycardic, as his home monitor and our office monitor are not able to register PVC's. I explained this in detail to both he and his wife. In order to evaluate for a readily correctable etiology, I will obtain a complete metabolic panel to see what his potassium levels are, will obtain a CBC to evaluate his Hgb level, and will obtain a serum magnesium level. I will also start KCl 20 meq daily.  Dispo: f/u 3 months.   Prentice Docker, M.D., F.A.C.C.

## 2014-07-17 ENCOUNTER — Ambulatory Visit: Payer: BLUE CROSS/BLUE SHIELD | Admitting: Adult Health

## 2014-07-17 ENCOUNTER — Encounter (HOSPITAL_COMMUNITY)
Admission: RE | Admit: 2014-07-17 | Discharge: 2014-07-17 | Disposition: A | Discharge status: Home / Self Care | Payer: BLUE CROSS/BLUE SHIELD | Source: Ambulatory Visit | Attending: Cardiovascular Disease | Admitting: Cardiovascular Disease

## 2014-07-17 DIAGNOSIS — Z952 Presence of prosthetic heart valve: Secondary | ICD-10-CM | POA: Diagnosis not present

## 2014-07-17 NOTE — Progress Notes (Signed)
Cardiac Rehabilitation Program Outcomes Report   Orientation:  07/11/14 Graduate Date:  tbd Discharge Date:  tbd # of sessions completed: 3  Cardiologist: Dr. Purvis SheffieldKoneswaran Family MD:  Dr. Ellene RouteKnowlton Class Time:  1610:  0815  A.  Exercise Program:  Tolerates exercise @ 2.07 METS for 30 minutes  B.  Mental Health:  Good mental attitude, Health related anxiety and Significant job stress  C.  Education/Instruction/Skills  Accurately checks own pulse.  Rest:  91  Exercise:  104, Knows THR for exercise and Uses Perceived Exertion Scale and/or Dyspnea Scale    D.  Nutrition/Weight Control/Body Composition:  Adherence to prescribed nutrition program: good    E.  Blood Lipids    Lab Results  Component Value Date   CHOL 183 05/16/2014   HDL 65 05/16/2014   LDLCALC 93 05/16/2014   TRIG 125 05/16/2014   CHOLHDL 2.8 05/16/2014    F.  Lifestyle Changes:  Making positive lifestyle changes and Not smoking:  Quit 04/2014  G.  Symptoms noted with exercise:  Fatigue  Report Completed By:  Santiago BurElizabeth Elham Fini, RN   Comments:  First week note.

## 2014-07-18 ENCOUNTER — Other Ambulatory Visit: Payer: Self-pay | Admitting: *Deleted

## 2014-07-18 ENCOUNTER — Ambulatory Visit (HOSPITAL_COMMUNITY)
Admission: RE | Admit: 2014-07-18 | Discharge: 2014-07-18 | Disposition: A | Discharge status: Home / Self Care | Payer: BLUE CROSS/BLUE SHIELD | Source: Ambulatory Visit | Attending: Cardiovascular Disease | Admitting: Cardiovascular Disease

## 2014-07-18 DIAGNOSIS — I493 Ventricular premature depolarization: Secondary | ICD-10-CM | POA: Diagnosis not present

## 2014-07-18 DIAGNOSIS — I10 Essential (primary) hypertension: Secondary | ICD-10-CM

## 2014-07-18 MED ORDER — CARVEDILOL 6.25 MG PO TABS: 6.2500 mg | ORAL_TABLET | Freq: Two times a day (BID) | ORAL | Status: DC

## 2014-07-18 NOTE — Progress Notes (Signed)
24 hour Holter Monitor in progress. 

## 2014-07-19 ENCOUNTER — Encounter (HOSPITAL_COMMUNITY)
Admission: RE | Admit: 2014-07-19 | Discharge: 2014-07-19 | Disposition: A | Discharge status: Home / Self Care | Payer: BLUE CROSS/BLUE SHIELD | Source: Ambulatory Visit | Attending: Cardiovascular Disease | Admitting: Cardiovascular Disease

## 2014-07-19 DIAGNOSIS — Z952 Presence of prosthetic heart valve: Secondary | ICD-10-CM | POA: Diagnosis not present

## 2014-07-22 ENCOUNTER — Encounter (HOSPITAL_COMMUNITY)
Admission: RE | Admit: 2014-07-22 | Discharge: 2014-07-22 | Disposition: A | Discharge status: Home / Self Care | Payer: BLUE CROSS/BLUE SHIELD | Source: Ambulatory Visit | Attending: Cardiovascular Disease | Admitting: Cardiovascular Disease

## 2014-07-22 DIAGNOSIS — Z952 Presence of prosthetic heart valve: Secondary | ICD-10-CM | POA: Diagnosis not present

## 2014-07-23 ENCOUNTER — Ambulatory Visit (INDEPENDENT_AMBULATORY_CARE_PROVIDER_SITE_OTHER): Payer: BLUE CROSS/BLUE SHIELD | Admitting: *Deleted

## 2014-07-23 DIAGNOSIS — Z952 Presence of prosthetic heart valve: Secondary | ICD-10-CM

## 2014-07-23 DIAGNOSIS — Z954 Presence of other heart-valve replacement: Secondary | ICD-10-CM

## 2014-07-23 DIAGNOSIS — Z9889 Other specified postprocedural states: Secondary | ICD-10-CM

## 2014-07-23 DIAGNOSIS — Z5181 Encounter for therapeutic drug level monitoring: Secondary | ICD-10-CM

## 2014-07-23 LAB — POCT INR: INR: 3

## 2014-07-24 ENCOUNTER — Ambulatory Visit: Payer: Self-pay | Admitting: Surgery

## 2014-07-24 ENCOUNTER — Encounter (HOSPITAL_COMMUNITY)
Admission: RE | Admit: 2014-07-24 | Discharge: 2014-07-24 | Disposition: A | Discharge status: Home / Self Care | Payer: BLUE CROSS/BLUE SHIELD | Source: Ambulatory Visit | Attending: Cardiovascular Disease | Admitting: Cardiovascular Disease

## 2014-07-24 DIAGNOSIS — Z952 Presence of prosthetic heart valve: Secondary | ICD-10-CM | POA: Diagnosis not present

## 2014-07-26 ENCOUNTER — Encounter (HOSPITAL_COMMUNITY)
Admission: RE | Admit: 2014-07-26 | Discharge: 2014-07-26 | Disposition: A | Discharge status: Home / Self Care | Payer: BLUE CROSS/BLUE SHIELD | Source: Ambulatory Visit | Attending: Cardiovascular Disease | Admitting: Cardiovascular Disease

## 2014-07-26 DIAGNOSIS — Z952 Presence of prosthetic heart valve: Secondary | ICD-10-CM | POA: Diagnosis not present

## 2014-07-29 ENCOUNTER — Ambulatory Visit (INDEPENDENT_AMBULATORY_CARE_PROVIDER_SITE_OTHER): Payer: Self-pay | Admitting: Surgical

## 2014-07-29 ENCOUNTER — Encounter (HOSPITAL_COMMUNITY): Payer: BLUE CROSS/BLUE SHIELD

## 2014-07-29 VITALS — BP 125/92 | HR 80 | Resp 16 | Ht 68.0 in | Wt 148.0 lb

## 2014-07-29 DIAGNOSIS — Z952 Presence of prosthetic heart valve: Secondary | ICD-10-CM

## 2014-07-29 DIAGNOSIS — Z9889 Other specified postprocedural states: Secondary | ICD-10-CM

## 2014-07-29 DIAGNOSIS — I350 Nonrheumatic aortic (valve) stenosis: Secondary | ICD-10-CM

## 2014-07-29 DIAGNOSIS — Z954 Presence of other heart-valve replacement: Secondary | ICD-10-CM

## 2014-07-29 DIAGNOSIS — I340 Nonrheumatic mitral (valve) insufficiency: Secondary | ICD-10-CM

## 2014-07-29 NOTE — Progress Notes (Signed)
[address]       [place] [address]             [phone]                  Russell [name] [facility] Medical Record #[id number] Date of Birth: [birth year]  Referring OZ:[id number], Russell Glazier, MD Primary Cardiology: Primary Russell Lame, MD  Chief Complaint:  Follow Up Visit  06/17/2014 Russell [name] [id number]  Surgeon: Russell Borne, MD  First Assistant: Russell Fudge, PA-C   Preoperative Diagnosis: Severe aortic stenosis and aortic insufficiency, severe mitral regurgitation, severe LV dysfunction with chronic systolic heart failure.   Postoperative Diagnosis: Same   Procedure: 1. Median Sternotomy 2. Extracorporeal circulation 3. Aortic valve replacement using a 21 mm Edwards Magna-Ease pericardial valve. 4. Mitral annuloplasty using a 26 mm Sorin Memo 3D ring.  Anesthesia: General Endotracheal  History of Present Illness:    The patient is seen in routine follow-up after above procedure. He currently is doing quite well. He is in cardiac rehabilitation 3 times a week. He is having his Coumadin monitored through the cardiology office. He does have some shortness of breath that he associates primarily with cold weather and previous smoking. He has not smoked since November. His primary physician did give him an inhaler recently. He takes occasional pain medication at this time. This is from minor soreness in his chest incision/sternotomy. He is not having any fevers, chills or other constitutional symptoms.         Zubrod Score: At the time of surgery this patient's most appropriate activity status/level should be described as:     0    Normal activity, no symptoms     1    Restricted in physical strenuous activity but ambulatory, able to do out light work     2    Ambulatory and capable of self care, unable to do work activities, up and about                 >50 % of waking hours                                                                                        3    Only limited self care, in bed greater than 50% of waking hours     4    Completely disabled, no self care, confined to bed or chair     5    Moribund  History  Smoking status  . Former Smoker -- 1.50 packs/day for 15 years  . Types: Cigarettes  . Start date: 06/22/1968  . Quit date: 05/08/2014  Smokeless tobacco  . Never Used       Allergies  Allergen Reactions  . Asa [Aspirin] Diarrhea and Nausea And Vomiting  . Prozac [Fluoxetine Hcl] Other (See Comments)    confusion    Current Outpatient Prescriptions  Medication Sig Dispense Refill  . albuterol (PROVENTIL HFA;VENTOLIN HFA) 108 (90 BASE) MCG/ACT inhaler Inhale 1-2 puffs into the lungs every 6 (six) hours as needed for wheezing or shortness of breath.    Marland Kitchen  ALPRAZolam (XANAX) 1 MG tablet Take 0.5 mg by mouth 4 (four) times daily as needed for anxiety.    . carvedilol (COREG) 6.25 MG tablet Take 1 tablet (6.25 mg total) by mouth 2 (two) times daily. 180 tablet 3  . docusate sodium (COLACE) 100 MG capsule Take 100 mg by mouth daily.    . ferrous gluconate (FERGON) 324 MG tablet Take 1 tablet (324 mg total) by mouth 2 (two) times daily with a meal. 60 tablet 1  . furosemide (LASIX) 20 MG tablet Take 1 tablet (20 mg total) by mouth daily. 90 tablet 3  . HYDROcodone-acetaminophen (NORCO) 10-325 MG per tablet Take 1 tablet by mouth every 6 (six) hours as needed.    Marland Kitchen. lisinopril (PRINIVIL,ZESTRIL) 5 MG tablet Take 1 tablet (5 mg total) by mouth daily. 90 tablet 3  . Multiple Vitamin (MULTI-VITAMIN PO) Take 1 tablet by mouth daily.     . potassium chloride SA (KLOR-CON M20) 20 MEQ tablet Take 1 tablet (20 mEq total) by mouth daily. 90 tablet 3  . warfarin (COUMADIN) 5 MG tablet Take 5 mg po daily or as directed by the Coumadin Clinic 60 tablet 1   No current facility-administered medications for this visit.       Physical Exam: BP 125/92 mmHg  Pulse 80   Resp 16  Ht 5\' 8"  (1.727 m)  Wt 148 lb (67.132 kg)  BMI 22.51 kg/m2  SpO2 98%  General appearance: alert, cooperative and no distress Heart: regular rate and rhythm and Soft systolic murmur in the mitral region and occasional extrasystole. Lungs: clear to auscultation bilaterally Abdomen: benign Extremities: extremities normal, atraumatic, no cyanosis or edema Wounds: Healing well  Diagnostic Studies & Laboratory data:         Recent Radiology Findings: [place] imaging with closed today's date due to weather.     Recent Labs: Lab Results  Component Value Date   WBC 9.0 07/16/2014   HGB 12.8* 07/16/2014   HCT 39.0 07/16/2014   PLT 231 07/16/2014   GLUCOSE 64* 07/16/2014   CHOL 183 05/16/2014   TRIG 125 05/16/2014   HDL 65 05/16/2014   LDLCALC 93 05/16/2014   ALT 18 07/16/2014   AST 21 07/16/2014   NA 139 07/16/2014   K 4.9 07/16/2014   CL 103 07/16/2014   CREATININE 0.96 07/16/2014   BUN 11 07/16/2014   CO2 28 07/16/2014   TSH 0.638 05/16/2014   INR 3.0 07/23/2014   HGBA1C 6.0* 06/13/2014      Assessment / Plan:  The patient is overall doing well in his recovery. He will continue cardiac rehabilitation. Discussed other activity advancement as well as lifting restrictions. We will see again in the office in 4 weeks with a chest x-ray at that time. We will see him sooner on an as-needed basis for any surgically related issues that may occur.          Russell [name] E 07/29/2014 1:28 PM

## 2014-07-29 NOTE — Patient Instructions (Signed)
We have a verbal discussion on activity advancement and lifting restrictions.

## 2014-07-31 ENCOUNTER — Encounter (HOSPITAL_COMMUNITY)
Admission: RE | Admit: 2014-07-31 | Discharge: 2014-07-31 | Disposition: A | Discharge status: Home / Self Care | Payer: BLUE CROSS/BLUE SHIELD | Source: Ambulatory Visit | Attending: Cardiovascular Disease | Admitting: Cardiovascular Disease

## 2014-07-31 DIAGNOSIS — Z952 Presence of prosthetic heart valve: Secondary | ICD-10-CM | POA: Diagnosis not present

## 2014-08-02 ENCOUNTER — Encounter (HOSPITAL_COMMUNITY)
Admission: RE | Admit: 2014-08-02 | Discharge: 2014-08-02 | Disposition: A | Discharge status: Home / Self Care | Payer: BLUE CROSS/BLUE SHIELD | Source: Ambulatory Visit | Attending: Cardiovascular Disease | Admitting: Cardiovascular Disease

## 2014-08-02 DIAGNOSIS — Z952 Presence of prosthetic heart valve: Secondary | ICD-10-CM | POA: Diagnosis not present

## 2014-08-05 ENCOUNTER — Encounter (HOSPITAL_COMMUNITY): Payer: BLUE CROSS/BLUE SHIELD

## 2014-08-07 ENCOUNTER — Encounter (HOSPITAL_COMMUNITY)
Admission: RE | Admit: 2014-08-07 | Discharge: 2014-08-07 | Disposition: A | Discharge status: Home / Self Care | Payer: BLUE CROSS/BLUE SHIELD | Source: Ambulatory Visit | Attending: Cardiovascular Disease | Admitting: Cardiovascular Disease

## 2014-08-07 DIAGNOSIS — Z952 Presence of prosthetic heart valve: Secondary | ICD-10-CM | POA: Diagnosis not present

## 2014-08-09 ENCOUNTER — Encounter (HOSPITAL_COMMUNITY)
Admission: RE | Admit: 2014-08-09 | Discharge: 2014-08-09 | Disposition: A | Discharge status: Home / Self Care | Payer: BLUE CROSS/BLUE SHIELD | Source: Ambulatory Visit | Attending: Cardiovascular Disease | Admitting: Cardiovascular Disease

## 2014-08-09 DIAGNOSIS — Z952 Presence of prosthetic heart valve: Secondary | ICD-10-CM | POA: Diagnosis not present

## 2014-08-12 ENCOUNTER — Encounter (HOSPITAL_COMMUNITY)
Admission: RE | Admit: 2014-08-12 | Discharge: 2014-08-12 | Disposition: A | Discharge status: Home / Self Care | Payer: BLUE CROSS/BLUE SHIELD | Source: Ambulatory Visit | Attending: Cardiovascular Disease | Admitting: Cardiovascular Disease

## 2014-08-12 DIAGNOSIS — Z952 Presence of prosthetic heart valve: Secondary | ICD-10-CM | POA: Diagnosis not present

## 2014-08-13 ENCOUNTER — Ambulatory Visit (INDEPENDENT_AMBULATORY_CARE_PROVIDER_SITE_OTHER): Payer: BLUE CROSS/BLUE SHIELD | Admitting: *Deleted

## 2014-08-13 DIAGNOSIS — Z952 Presence of prosthetic heart valve: Secondary | ICD-10-CM

## 2014-08-13 DIAGNOSIS — Z9889 Other specified postprocedural states: Secondary | ICD-10-CM

## 2014-08-13 DIAGNOSIS — Z5181 Encounter for therapeutic drug level monitoring: Secondary | ICD-10-CM

## 2014-08-13 DIAGNOSIS — Z954 Presence of other heart-valve replacement: Secondary | ICD-10-CM

## 2014-08-13 LAB — POCT INR: INR: 2.6

## 2014-08-14 ENCOUNTER — Encounter (HOSPITAL_COMMUNITY)
Admission: RE | Admit: 2014-08-14 | Discharge: 2014-08-14 | Disposition: A | Discharge status: Home / Self Care | Payer: BLUE CROSS/BLUE SHIELD | Source: Ambulatory Visit | Attending: Cardiovascular Disease | Admitting: Cardiovascular Disease

## 2014-08-14 DIAGNOSIS — I350 Nonrheumatic aortic (valve) stenosis: Secondary | ICD-10-CM | POA: Diagnosis not present

## 2014-08-14 DIAGNOSIS — Z952 Presence of prosthetic heart valve: Secondary | ICD-10-CM | POA: Insufficient documentation

## 2014-08-16 ENCOUNTER — Encounter (HOSPITAL_COMMUNITY)
Admission: RE | Admit: 2014-08-16 | Discharge: 2014-08-16 | Disposition: A | Discharge status: Home / Self Care | Payer: BLUE CROSS/BLUE SHIELD | Source: Ambulatory Visit | Attending: Cardiovascular Disease | Admitting: Cardiovascular Disease

## 2014-08-16 DIAGNOSIS — Z952 Presence of prosthetic heart valve: Secondary | ICD-10-CM | POA: Diagnosis not present

## 2014-08-19 ENCOUNTER — Encounter (HOSPITAL_COMMUNITY): Payer: BLUE CROSS/BLUE SHIELD

## 2014-08-21 ENCOUNTER — Encounter (HOSPITAL_COMMUNITY): Payer: BLUE CROSS/BLUE SHIELD

## 2014-08-23 ENCOUNTER — Other Ambulatory Visit: Payer: Self-pay | Admitting: Surgery

## 2014-08-23 ENCOUNTER — Encounter (HOSPITAL_COMMUNITY)
Admission: RE | Admit: 2014-08-23 | Discharge: 2014-08-23 | Disposition: A | Discharge status: Home / Self Care | Payer: BLUE CROSS/BLUE SHIELD | Source: Ambulatory Visit | Attending: Cardiovascular Disease | Admitting: Cardiovascular Disease

## 2014-08-23 DIAGNOSIS — Z952 Presence of prosthetic heart valve: Secondary | ICD-10-CM

## 2014-08-26 ENCOUNTER — Encounter (HOSPITAL_COMMUNITY)
Admission: RE | Admit: 2014-08-26 | Discharge: 2014-08-26 | Disposition: A | Discharge status: Home / Self Care | Payer: BLUE CROSS/BLUE SHIELD | Source: Ambulatory Visit | Attending: Cardiovascular Disease | Admitting: Cardiovascular Disease

## 2014-08-26 DIAGNOSIS — Z952 Presence of prosthetic heart valve: Secondary | ICD-10-CM | POA: Diagnosis not present

## 2014-08-28 ENCOUNTER — Ambulatory Visit
Admission: RE | Admit: 2014-08-28 | Discharge: 2014-08-28 | Disposition: A | Discharge status: Home / Self Care | Payer: BLUE CROSS/BLUE SHIELD | Source: Ambulatory Visit | Attending: Surgery | Admitting: Surgery

## 2014-08-28 ENCOUNTER — Ambulatory Visit (INDEPENDENT_AMBULATORY_CARE_PROVIDER_SITE_OTHER): Payer: Self-pay | Admitting: Surgery

## 2014-08-28 ENCOUNTER — Encounter: Payer: Self-pay | Admitting: Surgery

## 2014-08-28 ENCOUNTER — Encounter (HOSPITAL_COMMUNITY)
Admission: RE | Admit: 2014-08-28 | Discharge: 2014-08-28 | Disposition: A | Discharge status: Home / Self Care | Payer: BLUE CROSS/BLUE SHIELD | Source: Ambulatory Visit | Attending: Cardiovascular Disease | Admitting: Cardiovascular Disease

## 2014-08-28 VITALS — BP 104/70 | HR 64 | Resp 16 | Ht 68.0 in | Wt 143.0 lb

## 2014-08-28 DIAGNOSIS — I340 Nonrheumatic mitral (valve) insufficiency: Secondary | ICD-10-CM

## 2014-08-28 DIAGNOSIS — Z9889 Other specified postprocedural states: Secondary | ICD-10-CM

## 2014-08-28 DIAGNOSIS — Z952 Presence of prosthetic heart valve: Secondary | ICD-10-CM

## 2014-08-28 DIAGNOSIS — I350 Nonrheumatic aortic (valve) stenosis: Secondary | ICD-10-CM

## 2014-08-28 DIAGNOSIS — Z954 Presence of other heart-valve replacement: Secondary | ICD-10-CM

## 2014-08-28 NOTE — Progress Notes (Signed)
HPI: Patient returns for routine postoperative follow-up having undergone AVR using a pericardial valve and MV repair on 06/17/2014. The patient's early postoperative recovery while in the hospital was notable for an uncomplicated postop course. Since hospital discharge the patient reports that he has been feeling well. He has mild exertional dyspnea that has improved with more activity.   Current Outpatient Prescriptions  Medication Sig Dispense Refill  . albuterol (PROVENTIL HFA;VENTOLIN HFA) 108 (90 BASE) MCG/ACT inhaler Inhale 1-2 puffs into the lungs every 6 (six) hours as needed for wheezing or shortness of breath.    . ALPRAZolam (XANAX) 1 MG tablet Take 0.5 mg by mouth 4 (four) times daily as needed for anxiety.    . carvedilol (COREG) 6.25 MG tablet Take 1 tablet (6.25 mg total) by mouth 2 (two) times daily. 180 tablet 3  . docusate sodium (COLACE) 100 MG capsule Take 100 mg by mouth daily.    . ferrous gluconate (FERGON) 324 MG tablet Take 1 tablet (324 mg total) by mouth 2 (two) times daily with a meal. 60 tablet 1  . furosemide (LASIX) 20 MG tablet Take 1 tablet (20 mg total) by mouth daily. 90 tablet 3  . HYDROcodone-acetaminophen (NORCO) 10-325 MG per tablet Take 1 tablet by mouth every 6 (six) hours as needed.    Marland Kitchen. lisinopril (PRINIVIL,ZESTRIL) 5 MG tablet Take 1 tablet (5 mg total) by mouth daily. 90 tablet 3  . Multiple Vitamin (MULTI-VITAMIN PO) Take 1 tablet by mouth daily.     . potassium chloride SA (KLOR-CON M20) 20 MEQ tablet Take 1 tablet (20 mEq total) by mouth daily. 90 tablet 3  . warfarin (COUMADIN) 5 MG tablet Take 5 mg po daily or as directed by the [facility] 60 tablet 1   No current facility-administered medications for this visit.    Physical Exam: BP 104/70 mmHg  Pulse 64  Resp 16  Ht 5\' 8"  (1.727 m)  Wt 143 lb (64.864 kg)  BMI 21.75 kg/m2  SpO2 95% He looks well Lungs are clear Cardiac exam shows a regular rate and rhythm with normal  heart sounds. The chest incision is healing well and the sternum is stable. There is no significant edema.   Diagnostic Tests:  CLINICAL DATA: Status post mitral valve replacement 9 weeks ago, currently asymptomatic  EXAM: CHEST 2 VIEW  COMPARISON: PA and lateral chest of April 25, 2015  FINDINGS: The lungs are adequately inflated and clear. Subtle nodular densities project over the posterior aspects of the ninth ribs bilaterally consistent with nipple shadows. The heart and pulmonary vascularity are normal. Prosthetic valve rings in the mitral and aortic positions are demonstrated. There are 7 intact sternal wires. There is no pleural effusion. The bony thorax exhibits no acute abnormality.  IMPRESSION: There is no CHF nor other acute cardiopulmonary abnormality.   Electronically Signed  By: David SwazilandJordan  On: 08/28/2014 10:24   Impression:  Overall I think he is doing well. I encouraged him to continue walking. He is about done with cardiac rehab. I told him he could drive his car but should not lift anything heavier than 10 lbs for three months postop. I told him he could return to work on April 6.   Plan:  He will continue to follow up with Dr. Purvis SheffieldKoneswaran and will contact me if he develops any problems with his incisions. I anticipate that he will only need coumadin for 3 months with a MV repair and a tissue  aortic valve and can then be converted to aspirin alone.    Alleen Borne, MD [facility] [phone]

## 2014-08-30 ENCOUNTER — Encounter (HOSPITAL_COMMUNITY): Payer: BLUE CROSS/BLUE SHIELD

## 2014-09-02 ENCOUNTER — Encounter (HOSPITAL_COMMUNITY): Payer: BLUE CROSS/BLUE SHIELD

## 2014-09-02 NOTE — Progress Notes (Signed)
Patient is discharged from [facility] 08/30/14, with 17 sessions.  Pt called stating that he was returning to work and would not be returning to Cardiac Rehab.  He achieved LTG of 30 minutes of aerobic exercise at max met level of 3.39.  All patient vitals are WNL.  Patient has met with dietician. Patient plans to exercise at home. Cardiac Rehab will make 1 month, 6 month and 1 year call backs.  Patient had no complaints of any abnormal S/S or pain on their exit visit.   

## 2014-09-04 ENCOUNTER — Encounter (HOSPITAL_COMMUNITY): Payer: BLUE CROSS/BLUE SHIELD

## 2014-09-06 ENCOUNTER — Encounter (HOSPITAL_COMMUNITY): Payer: BLUE CROSS/BLUE SHIELD

## 2014-09-09 ENCOUNTER — Encounter (HOSPITAL_COMMUNITY): Payer: BLUE CROSS/BLUE SHIELD

## 2014-09-10 ENCOUNTER — Ambulatory Visit (INDEPENDENT_AMBULATORY_CARE_PROVIDER_SITE_OTHER): Payer: BLUE CROSS/BLUE SHIELD | Admitting: *Deleted

## 2014-09-10 DIAGNOSIS — Z954 Presence of other heart-valve replacement: Secondary | ICD-10-CM

## 2014-09-10 DIAGNOSIS — Z9889 Other specified postprocedural states: Secondary | ICD-10-CM | POA: Diagnosis not present

## 2014-09-10 DIAGNOSIS — Z952 Presence of prosthetic heart valve: Secondary | ICD-10-CM

## 2014-09-10 DIAGNOSIS — Z5181 Encounter for therapeutic drug level monitoring: Secondary | ICD-10-CM

## 2014-09-10 LAB — POCT INR: INR: 2.6

## 2014-09-11 ENCOUNTER — Encounter (HOSPITAL_COMMUNITY): Payer: BLUE CROSS/BLUE SHIELD

## 2014-09-13 ENCOUNTER — Encounter (HOSPITAL_COMMUNITY): Payer: BLUE CROSS/BLUE SHIELD

## 2014-09-16 ENCOUNTER — Encounter (HOSPITAL_COMMUNITY): Payer: BLUE CROSS/BLUE SHIELD

## 2014-09-18 ENCOUNTER — Encounter (HOSPITAL_COMMUNITY): Payer: BLUE CROSS/BLUE SHIELD

## 2014-09-20 ENCOUNTER — Encounter (HOSPITAL_COMMUNITY): Payer: BLUE CROSS/BLUE SHIELD

## 2014-09-23 ENCOUNTER — Encounter (HOSPITAL_COMMUNITY): Payer: BLUE CROSS/BLUE SHIELD

## 2014-09-24 ENCOUNTER — Ambulatory Visit: Payer: BC Managed Care – PPO | Admitting: Cardiovascular Disease

## 2014-09-24 ENCOUNTER — Encounter: Payer: Self-pay | Admitting: Cardiovascular Disease

## 2014-09-24 ENCOUNTER — Ambulatory Visit (INDEPENDENT_AMBULATORY_CARE_PROVIDER_SITE_OTHER): Payer: BLUE CROSS/BLUE SHIELD | Admitting: Cardiovascular Disease

## 2014-09-24 ENCOUNTER — Ambulatory Visit (INDEPENDENT_AMBULATORY_CARE_PROVIDER_SITE_OTHER): Payer: BLUE CROSS/BLUE SHIELD | Admitting: *Deleted

## 2014-09-24 VITALS — BP 139/85 | HR 120 | Ht 67.0 in | Wt 148.0 lb

## 2014-09-24 DIAGNOSIS — I493 Ventricular premature depolarization: Secondary | ICD-10-CM

## 2014-09-24 DIAGNOSIS — Z954 Presence of other heart-valve replacement: Secondary | ICD-10-CM

## 2014-09-24 DIAGNOSIS — Z9889 Other specified postprocedural states: Secondary | ICD-10-CM | POA: Diagnosis not present

## 2014-09-24 DIAGNOSIS — Z952 Presence of prosthetic heart valve: Secondary | ICD-10-CM

## 2014-09-24 DIAGNOSIS — J438 Other emphysema: Secondary | ICD-10-CM

## 2014-09-24 DIAGNOSIS — I10 Essential (primary) hypertension: Secondary | ICD-10-CM

## 2014-09-24 DIAGNOSIS — I429 Cardiomyopathy, unspecified: Secondary | ICD-10-CM

## 2014-09-24 DIAGNOSIS — I5022 Chronic systolic (congestive) heart failure: Secondary | ICD-10-CM

## 2014-09-24 DIAGNOSIS — Z5181 Encounter for therapeutic drug level monitoring: Secondary | ICD-10-CM

## 2014-09-24 DIAGNOSIS — I5189 Other ill-defined heart diseases: Secondary | ICD-10-CM

## 2014-09-24 DIAGNOSIS — I519 Heart disease, unspecified: Secondary | ICD-10-CM

## 2014-09-24 MED ORDER — CARVEDILOL 12.5 MG PO TABS: 12.5000 mg | ORAL_TABLET | Freq: Two times a day (BID) | ORAL | Status: DC

## 2014-09-24 MED ORDER — ASPIRIN EC 81 MG PO TBEC: 81.0000 mg | DELAYED_RELEASE_TABLET | Freq: Every day | ORAL | Status: DC

## 2014-09-24 NOTE — Patient Instructions (Addendum)
   Increase Coreg to 12.5mg  twice a day  - may take 2 of your 6.25mg  tabs twice a day till finish current supply - new sent to pharmacy today. Continue all other medications.   [facility] will contact with results via phone or letter.   Your physician has requested that you have an echocardiogram. Echocardiography is a painless test that uses sound waves to create images of your heart. It provides your doctor with information about the size and shape of your heart and how well your heart's chambers and valves are working. This procedure takes approximately one hour. There are no restrictions for this procedure. - DUE 2 WEEKS PRIOR TO NEXT OFFICE VISIT.   Stop Warfarin. Begin Aspirin 81mg  daily.    Follow up in  4 months

## 2014-09-24 NOTE — Addendum Note (Signed)
Addended by: Lesle ChrisHILL, Lew Prout G on: 09/24/2014 09:12 AM   Modules accepted: Orders, Medications

## 2014-09-24 NOTE — Progress Notes (Signed)
Patient ID: Russell [name], male   DOB: 1955-08-05, 59 y.o.   MRN: [id number]      SUBJECTIVE: The patient presents for routine follow up. He underwent aortic valve replacement using a 21 mm Edwards Magna-Ease pericardial valve and mitral annuloplasty using a 26 mm Sorin Memo 3D ring on 06/17/14. Holter monitoring since his last visit demonstrated sinus rhythm with frequent PVC's, for which I increased his Coreg dose.  ECG performed in the office today demonstrates sinus tachycardia with LVH and consequent repolarization abnormalities and frequent PVCs, heart rate 120 bpm.  He said he is feeling much, much better. He went back to work last week at the same place he has been working for the past 34 years. He walks a lot and has to climb stairs several times a day and has felt well. He was unable to continue to participate in cardiac rehabilitation due to cost.  Review of Systems: As per "subjective", otherwise negative.  Allergies  Allergen Reactions  . Asa [Aspirin] Diarrhea and Nausea And Vomiting  . Prozac [Fluoxetine Hcl] Other (See Comments)    confusion    Current Outpatient Prescriptions  Medication Sig Dispense Refill  . albuterol (PROVENTIL HFA;VENTOLIN HFA) 108 (90 BASE) MCG/ACT inhaler Inhale 1-2 puffs into the lungs every 6 (six) hours as needed for wheezing or shortness of breath.    . ALPRAZolam (XANAX) 1 MG tablet Take 0.5 mg by mouth 4 (four) times daily as needed for anxiety.    . carvedilol (COREG) 6.25 MG tablet Take 1 tablet (6.25 mg total) by mouth 2 (two) times daily. 180 tablet 3  . docusate sodium (COLACE) 100 MG capsule Take 100 mg by mouth daily.    . ferrous gluconate (FERGON) 324 MG tablet Take 1 tablet (324 mg total) by mouth 2 (two) times daily with a meal. 60 tablet 1  . furosemide (LASIX) 20 MG tablet Take 1 tablet (20 mg total) by mouth daily. 90 tablet 3  . HYDROcodone-acetaminophen (NORCO) 10-325 MG per tablet Take 1 tablet by mouth every 6 (six) hours  as needed.    Marland Kitchen. lisinopril (PRINIVIL,ZESTRIL) 5 MG tablet Take 1 tablet (5 mg total) by mouth daily. 90 tablet 3  . Multiple Vitamin (MULTI-VITAMIN PO) Take 1 tablet by mouth daily.     . potassium chloride SA (KLOR-CON M20) 20 MEQ tablet Take 1 tablet (20 mEq total) by mouth daily. 90 tablet 3  . warfarin (COUMADIN) 5 MG tablet Take 5 mg po daily or as directed by the [facility] 60 tablet 1   No current facility-administered medications for this visit.    Past Medical History  Diagnosis Date  . Essential hypertension   . Anxiety   . Back pain   . Cervical disc disease     C6-7  . Heart murmur   . Sleep apnea     per wife, no sleep study  . Shortness of breath dyspnea   . COPD (chronic obstructive pulmonary disease)   . Chronic kidney disease     kidney cyst  . Arthritis   . Lung nodules     Past Surgical History  Procedure Laterality Date  . Back surgery    . Right knee surgery      Cartilage removed  . Colonoscopy  10/19/2011    Procedure: COLONOSCOPY;  Surgeon: Dalia HeadingMark A Jenkins, MD;  [facility];  Service: Gastroenterology;  Laterality: N/A;  . Left and right heart catheterization with coronary angiogram N/A 05/23/2014  Procedure: LEFT AND RIGHT HEART CATHETERIZATION WITH CORONARY ANGIOGRAM;  Surgeon: Corky Crafts, MD;  Location: [facility] CATH LAB;  Service: Cardiovascular;  Laterality: N/A;  . Cardiac catheterization  05/23/14  . Aortic valve replacement N/A 06/17/2014    Procedure: AORTIC VALVE REPLACEMENT (AVR);  Surgeon: Alleen Borne, MD;  Location: [facility] OR;  Service: Open Heart Surgery;  Laterality: N/A;  . Mitral valve repair N/A 06/17/2014    Procedure: MITRAL VALVE REPAIR (MVR);  Surgeon: Alleen Borne, MD;  Location: [facility] OR;  Service: Open Heart Surgery;  Laterality: N/A;  . Tee without cardioversion N/A 06/17/2014    Procedure: TRANSESOPHAGEAL ECHOCARDIOGRAM (TEE);  Surgeon: Alleen Borne, MD;  Location: [facility] OR;  Service: Open Heart Surgery;   Laterality: N/A;    History   Social History  . Marital Status: Married    Spouse Name: N/A  . Number of Children: N/A  . Years of Education: N/A   Occupational History  . Not on file.   Social History Main Topics  . Smoking status: Former Smoker -- 1.50 packs/day for 15 years    Types: Cigarettes    Start date: 06/22/1968    Quit date: 05/08/2014  . Smokeless tobacco: Never Used  . Alcohol Use: No  . Drug Use: No  . Sexual Activity: Yes   Other Topics Concern  . Not on file   Social History Narrative     Filed Vitals:   09/24/14 0818  BP: 139/85  Pulse: 120  Height:  (1.702 m)  Weight: 148 lb (67.132 kg)  SpO2: 96%    PHYSICAL EXAM General: NAD HEENT: Normal. Neck: No JVD, no thyromegaly. Lungs: Clear to auscultation bilaterally with normal respiratory effort. CV: Nondisplaced PMI. Well healed midline incision. Tachycardic, regular rhythm with premature contractions, normal S1/S2, no S3/S4, 1/6 systolic murmur heard throughout precordium. No pretibial or periankle edema.  Abdomen: Soft, nontender, no hepatosplenomegaly, no distention.  Neurologic: Alert and oriented x 3.  Psych: Normal affect. Skin: Normal. Musculoskeletal: Normal range of motion, no gross deformities. Extremities: No clubbing or cyanosis.   ECG: Most recent ECG reviewed.      ASSESSMENT AND PLAN: 1. S/p AVR and MV annuloplasty: Stable. Will repeat echocardiogram within next several months to assess both valve integrity and for LV systolic function improvement. As it has been greater than 3 months since surgery, he should be able to stop warfarin and commence ASA 81 mg daily.  2. Cardiomyopathy with severe LV dysfunction and severe LV dilatation in the context of severe aortic stenosis, moderate aortic regurgitation, and moderate to severe mitral regurgitation: He is on Coreg and lisinopril, and my hope is that ventricular function will improve now that he has undergone both  AVR and MV repair. No evidence of heart failure. Will continue 20 mg Lasix daily. Increase Coreg to 12.5 mg bid.  3. Essential HTN: Reasonably controlled today. Will need continued monitoring. Continue lisinopril 5 mg daily. Increase Coreg to 12.5 mg bid.  4. Tobacco abuse disorder: He has since quit and rarely chews Nicorette gum.  5. Frequent PVC's: As he is on supplemental KCl, I will repeat a BMET which he did not obtain as instructed earlier.  Dispo: f/u 4 months with echocardiogram beforehand.   Prentice Docker, M.D., F.A.C.C.

## 2014-09-25 ENCOUNTER — Encounter (HOSPITAL_COMMUNITY): Payer: BLUE CROSS/BLUE SHIELD

## 2014-09-25 ENCOUNTER — Other Ambulatory Visit: Payer: Self-pay | Admitting: Cardiovascular Disease

## 2014-09-26 LAB — BASIC METABOLIC PANEL
BUN: 12 mg/dL (ref 6–23)
CALCIUM: 9.8 mg/dL (ref 8.4–10.5)
CO2: 27 mEq/L (ref 19–32)
CREATININE: 0.9 mg/dL (ref 0.50–1.35)
Chloride: 100 mEq/L (ref 96–112)
GLUCOSE: 138 mg/dL — AB (ref 70–99)
Potassium: 4.6 mEq/L (ref 3.5–5.3)
Sodium: 137 mEq/L (ref 135–145)

## 2014-09-27 ENCOUNTER — Encounter (HOSPITAL_COMMUNITY): Payer: BLUE CROSS/BLUE SHIELD

## 2014-09-30 ENCOUNTER — Encounter (HOSPITAL_COMMUNITY): Payer: BLUE CROSS/BLUE SHIELD

## 2014-10-02 ENCOUNTER — Encounter (HOSPITAL_COMMUNITY): Payer: BLUE CROSS/BLUE SHIELD

## 2015-01-22 ENCOUNTER — Other Ambulatory Visit: Payer: Self-pay | Admitting: Cardiovascular Disease

## 2015-01-22 ENCOUNTER — Ambulatory Visit (INDEPENDENT_AMBULATORY_CARE_PROVIDER_SITE_OTHER): Payer: BLUE CROSS/BLUE SHIELD | Admitting: *Deleted

## 2015-01-22 ENCOUNTER — Other Ambulatory Visit: Payer: Self-pay

## 2015-01-22 ENCOUNTER — Telehealth: Payer: Self-pay | Admitting: *Deleted

## 2015-01-22 ENCOUNTER — Ambulatory Visit (INDEPENDENT_AMBULATORY_CARE_PROVIDER_SITE_OTHER): Payer: BLUE CROSS/BLUE SHIELD

## 2015-01-22 VITALS — BP 140/84 | HR 100 | Ht 67.0 in | Wt 143.0 lb

## 2015-01-22 DIAGNOSIS — Z954 Presence of other heart-valve replacement: Secondary | ICD-10-CM

## 2015-01-22 DIAGNOSIS — I5022 Chronic systolic (congestive) heart failure: Secondary | ICD-10-CM | POA: Diagnosis not present

## 2015-01-22 DIAGNOSIS — R9430 Abnormal result of cardiovascular function study, unspecified: Secondary | ICD-10-CM

## 2015-01-22 DIAGNOSIS — Z9889 Other specified postprocedural states: Secondary | ICD-10-CM | POA: Diagnosis not present

## 2015-01-22 DIAGNOSIS — Z952 Presence of prosthetic heart valve: Secondary | ICD-10-CM

## 2015-01-22 DIAGNOSIS — R0602 Shortness of breath: Secondary | ICD-10-CM | POA: Diagnosis not present

## 2015-01-22 NOTE — Progress Notes (Signed)
Pt denies chest pain/dizziness, some SOB. Had to leave work early last night c/o weakness. Pt has started back smoking some has been under a lot of stress with cut in wages, cannot get assistance for help to pay bills.

## 2015-01-22 NOTE — Telephone Encounter (Signed)
Pt wife aware upset and says pt may not proceed but will try to talk him into consult with EP MD, will place referral and forward to schedulers

## 2015-01-22 NOTE — Telephone Encounter (Signed)
-----   Message from Laqueta Linden, MD sent at 01/22/2015 10:25 AM EDT ----- Function remains severely reduced. Needs ICD. Please arrange for EP consultation.

## 2015-02-06 ENCOUNTER — Telehealth: Payer: Self-pay | Admitting: Cardiovascular Disease

## 2015-02-06 ENCOUNTER — Ambulatory Visit (INDEPENDENT_AMBULATORY_CARE_PROVIDER_SITE_OTHER): Payer: BLUE CROSS/BLUE SHIELD | Admitting: Cardiovascular Disease

## 2015-02-06 VITALS — BP 111/69 | HR 66 | Ht 67.0 in | Wt 142.0 lb

## 2015-02-06 DIAGNOSIS — Z954 Presence of other heart-valve replacement: Secondary | ICD-10-CM

## 2015-02-06 DIAGNOSIS — I519 Heart disease, unspecified: Secondary | ICD-10-CM | POA: Diagnosis not present

## 2015-02-06 DIAGNOSIS — I10 Essential (primary) hypertension: Secondary | ICD-10-CM | POA: Diagnosis not present

## 2015-02-06 DIAGNOSIS — Z9889 Other specified postprocedural states: Secondary | ICD-10-CM

## 2015-02-06 DIAGNOSIS — Z952 Presence of prosthetic heart valve: Secondary | ICD-10-CM

## 2015-02-06 DIAGNOSIS — I493 Ventricular premature depolarization: Secondary | ICD-10-CM

## 2015-02-06 DIAGNOSIS — I5022 Chronic systolic (congestive) heart failure: Secondary | ICD-10-CM

## 2015-02-06 DIAGNOSIS — I429 Cardiomyopathy, unspecified: Secondary | ICD-10-CM

## 2015-02-06 NOTE — Progress Notes (Signed)
Patient ID: Russell [name], male   DOB: 05/16/1956, 59 y.o.   MRN: [id number]      SUBJECTIVE: The patient presents for routine follow up. He underwent aortic valve replacement using a 21 mm Edwards Magna-Ease pericardial valve and mitral annuloplasty using a 26 mm Sorin Memo 3D ring on 06/17/14.  Echocardiogram on 01/22/15 demonstrated severely reduced left ventricular systolic function, LVEF 25-30%. There is a mildly elevated gradient across the prosthetic aortic valve of 30 mmHg. Mitral annular ring was adequately positioned with mild regurgitation. There was severe left atrial dilatation.  He has been back to work and has had no physical limitations. He is very tearful due to financial stressors. He continues to ask whether or not he needs to have an ICD.   Review of Systems: As per "subjective", otherwise negative.  Allergies  Allergen Reactions  . Asa [Aspirin] Diarrhea and Nausea And Vomiting  . Prozac [Fluoxetine Hcl] Other (See Comments)    confusion    Current Outpatient Prescriptions  Medication Sig Dispense Refill  . albuterol (PROVENTIL HFA;VENTOLIN HFA) 108 (90 BASE) MCG/ACT inhaler Inhale 1-2 puffs into the lungs every 6 (six) hours as needed for wheezing or shortness of breath.    . ALPRAZolam (XANAX) 1 MG tablet Take 0.5-1 mg by mouth 4 (four) times daily as needed for anxiety.     Marland Kitchen aspirin EC 81 MG tablet Take 1 tablet (81 mg total) by mouth daily.    . carvedilol (COREG) 12.5 MG tablet Take 1 tablet (12.5 mg total) by mouth 2 (two) times daily. 60 tablet 6  . ferrous gluconate (FERGON) 324 MG tablet Take 1 tablet (324 mg total) by mouth 2 (two) times daily with a meal. 60 tablet 1  . furosemide (LASIX) 20 MG tablet Take 1 tablet (20 mg total) by mouth daily. 90 tablet 3  . HYDROcodone-acetaminophen (NORCO) 10-325 MG per tablet Take 1 tablet by mouth 2 (two) times daily as needed.     Marland Kitchen lisinopril (PRINIVIL,ZESTRIL) 5 MG tablet Take 1 tablet (5 mg total) by mouth  daily. 90 tablet 3  . Multiple Vitamin (MULTI-VITAMIN PO) Take 1 tablet by mouth daily.     . potassium chloride SA (KLOR-CON M20) 20 MEQ tablet Take 1 tablet (20 mEq total) by mouth daily. 90 tablet 3   No current facility-administered medications for this visit.    Past Medical History  Diagnosis Date  . Essential hypertension   . Anxiety   . Back pain   . Cervical disc disease     C6-7  . Heart murmur   . Sleep apnea     per wife, no sleep study  . Shortness of breath dyspnea   . COPD (chronic obstructive pulmonary disease)   . Chronic kidney disease     kidney cyst  . Arthritis   . Lung nodules     Past Surgical History  Procedure Laterality Date  . Back surgery    . Right knee surgery      Cartilage removed  . Colonoscopy  10/19/2011    Procedure: COLONOSCOPY;  Surgeon: Dalia Heading, MD;  [facility];  Service: Gastroenterology;  Laterality: N/A;  . Left and right heart catheterization with coronary angiogram N/A 05/23/2014    Procedure: LEFT AND RIGHT HEART CATHETERIZATION WITH CORONARY ANGIOGRAM;  Surgeon: Corky Crafts, MD;  Location: [facility] CATH LAB;  Service: Cardiovascular;  Laterality: N/A;  . Cardiac catheterization  05/23/14  . Aortic valve replacement N/A 06/17/2014  Procedure: AORTIC VALVE REPLACEMENT (AVR);  Surgeon: Alleen Borne, MD;  Location: [facility] OR;  Service: Open Heart Surgery;  Laterality: N/A;  . Mitral valve repair N/A 06/17/2014    Procedure: MITRAL VALVE REPAIR (MVR);  Surgeon: Alleen Borne, MD;  Location: [facility] OR;  Service: Open Heart Surgery;  Laterality: N/A;  . Tee without cardioversion N/A 06/17/2014    Procedure: TRANSESOPHAGEAL ECHOCARDIOGRAM (TEE);  Surgeon: Alleen Borne, MD;  Location: [facility] OR;  Service: Open Heart Surgery;  Laterality: N/A;    Social History   Social History  . Marital Status: Married    Spouse Name: N/A  . Number of Children: N/A  . Years of Education: N/A   Occupational History  . Not on file.    Social History Main Topics  . Smoking status: Current Some Day Smoker -- 1.50 packs/day for 15 years    Types: Cigarettes    Start date: 06/22/1968    Last Attempt to Quit: 05/08/2014  . Smokeless tobacco: Never Used  . Alcohol Use: No  . Drug Use: No  . Sexual Activity: Yes   Other Topics Concern  . Not on file   Social History Narrative     Filed Vitals:   02/06/15 0811  BP: 111/69  Pulse: 66  Height:  (1.702 m)  Weight: 142 lb (64.411 kg)    PHYSICAL EXAM General: NAD HEENT: Normal. Neck: No JVD, no thyromegaly. Lungs: Clear to auscultation bilaterally with normal respiratory effort. CV: Normal rate, regular rhythm with frequent premature contractions, normal S1/S2, no S3/S4, 1/6 systolic murmur heard throughout precordium. No pretibial or periankle edema.  Abdomen: Soft, no distention.  Neurologic: Alert and oriented x 3.  Psych: Tearful. Skin: Normal. Musculoskeletal: Normal range of motion, no gross deformities. Extremities: No clubbing or cyanosis.   ECG: Most recent ECG reviewed.      ASSESSMENT AND PLAN: 1. S/p AVR and MV annuloplasty: Stable as per echocardiogram noted above. Continue ASA.  2. Cardiomyopathy with severe LV dysfunction and severe LV dilatation in the context of severe aortic stenosis, moderate aortic regurgitation, and moderate to severe mitral regurgitation: He is on Coreg and lisinopril. No evidence of heart failure. Will continue 20 mg Lasix daily. Have already placed EP referral for ICD but patient would prefer to wait for now. Informed about risk of sudden cardiac death.  3. Essential HTN: Well controlled today. No changes.  4. Tobacco abuse disorder: He has since quit and rarely chews Nicorette gum.  5. Frequent PVC's: BMET on 4/13 showed K normal at 4.6. EP referral placed for ICD. Patient wants to wait.   Dispo: f/u 6 months. Will check to see if social services can provide assistance. Patient says he was denied  assistance from [facility].  Prentice Docker, M.D., F.A.C.C.

## 2015-02-06 NOTE — Telephone Encounter (Signed)
Patient wants to know what he can take for a headache. / tg

## 2015-02-06 NOTE — Telephone Encounter (Signed)
Pt informed he could use tylenol for headace

## 2015-02-06 NOTE — Patient Instructions (Signed)
Continue all current medications. Your physician wants you to follow up in: 6 months.  You will receive a reminder letter in the mail one-two months in advance.  If you don't receive a letter, please call our office to schedule the follow up appointment   

## 2015-02-19 ENCOUNTER — Institutional Professional Consult (permissible substitution): Payer: BLUE CROSS/BLUE SHIELD | Admitting: Internal Medicine

## 2015-03-31 ENCOUNTER — Other Ambulatory Visit: Payer: Self-pay | Admitting: Cardiovascular Disease

## 2015-04-14 ENCOUNTER — Other Ambulatory Visit: Payer: Self-pay | Admitting: Cardiovascular Disease

## 2015-05-21 ENCOUNTER — Other Ambulatory Visit: Payer: Self-pay | Admitting: Cardiovascular Disease

## 2015-06-25 ENCOUNTER — Other Ambulatory Visit: Payer: Self-pay | Admitting: Cardiovascular Disease

## 2015-07-07 IMAGING — CR DG CHEST 1V PORT
1 series · 1 of 1 positions shown · non-contrast
Comparison: June 17, 2014.

CLINICAL DATA: Pneumothorax.

EXAM:
PORTABLE CHEST - 1 VIEW

[portable]
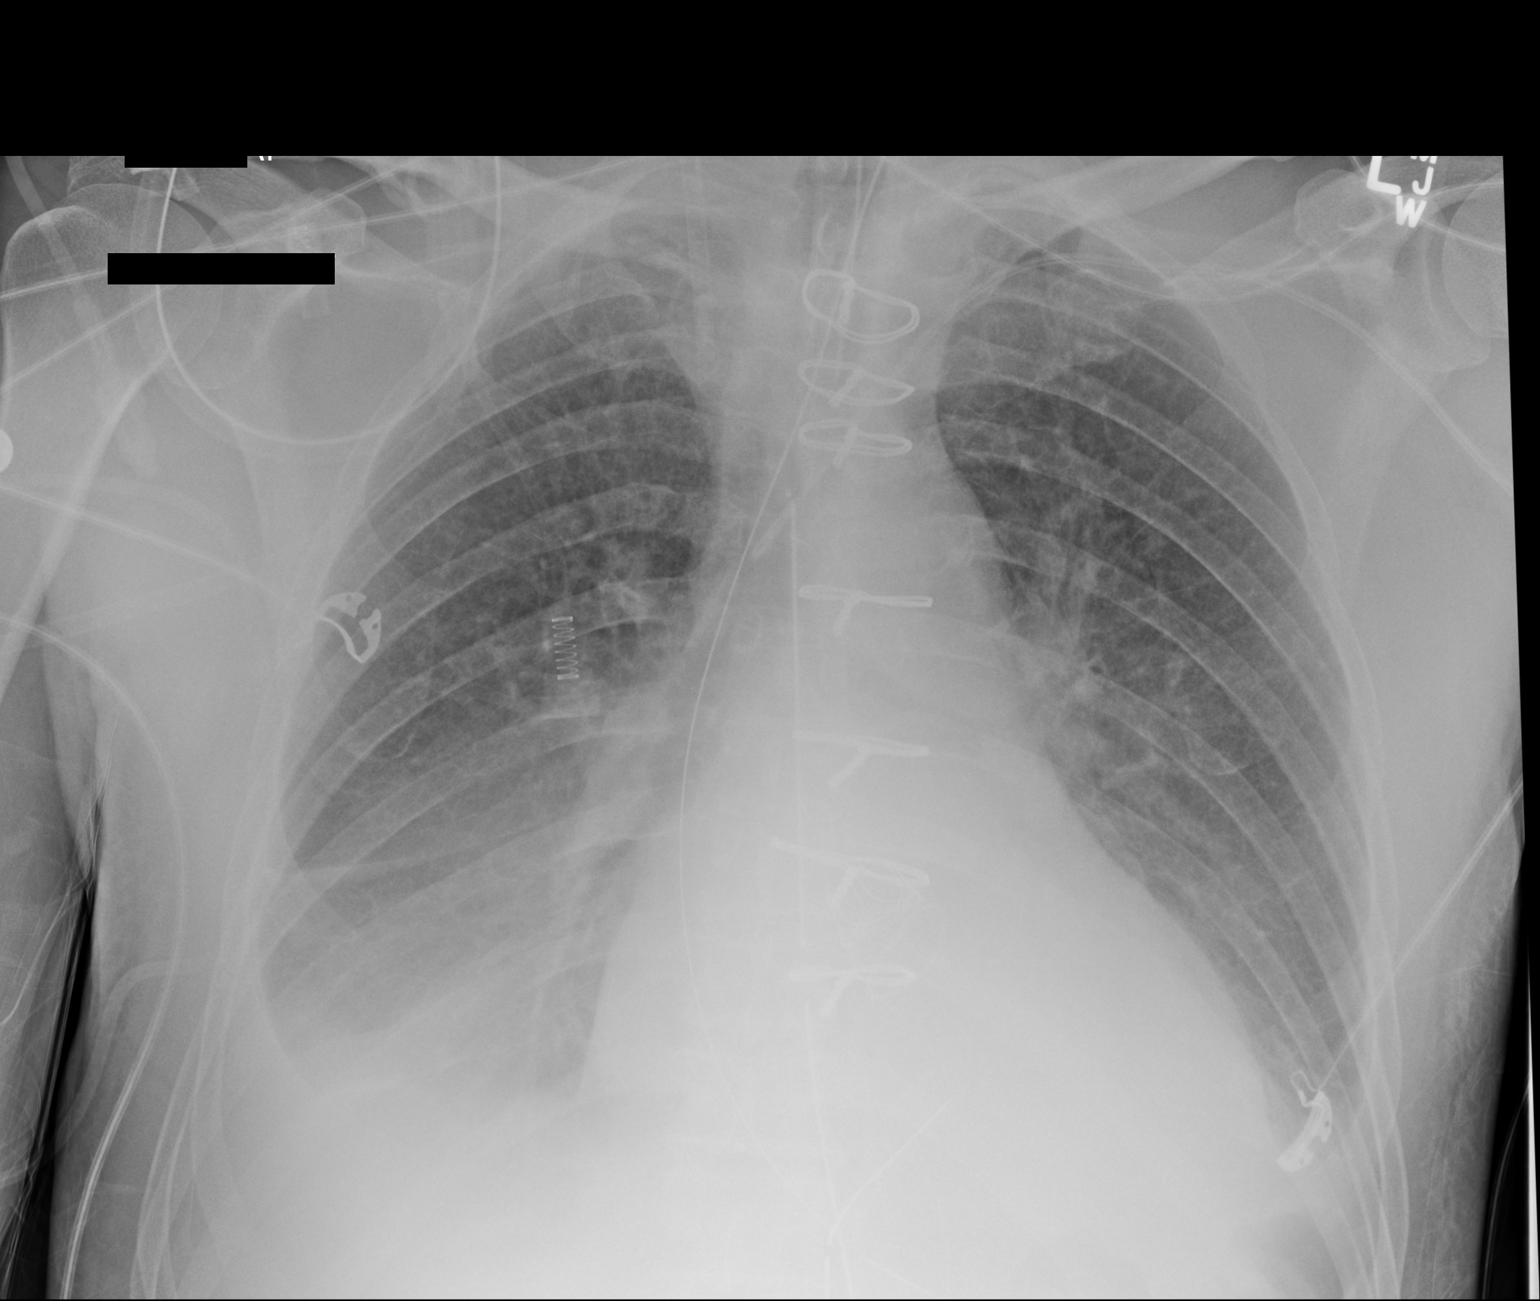

[1 of 1 positions shown; findings below may reference images not displayed]

FINDINGS: Endotracheal and nasogastric tubes appear to be in grossly good
position. Sternotomy wires are noted. Right internal jugular venous
sheath remains. Left subclavian catheter line is noted with distal
tip overlying expected position of SVC. Mediastinal drain is noted.
No pneumothorax is noted. Bilateral pleural effusions are noted with
right greater than left.
IMPRESSION: Stable support apparatus. Stable bilateral pleural effusions with
right greater than left. No pneumothorax is noted.

## 2015-07-08 IMAGING — CR DG CHEST 1V PORT
1 series · 1 of 1 positions shown · non-contrast
Comparison: 06/18/2014.

CLINICAL DATA: Chest tube removal.  Aortic valve replacement.

EXAM:
PORTABLE CHEST - 1 VIEW

[AP]
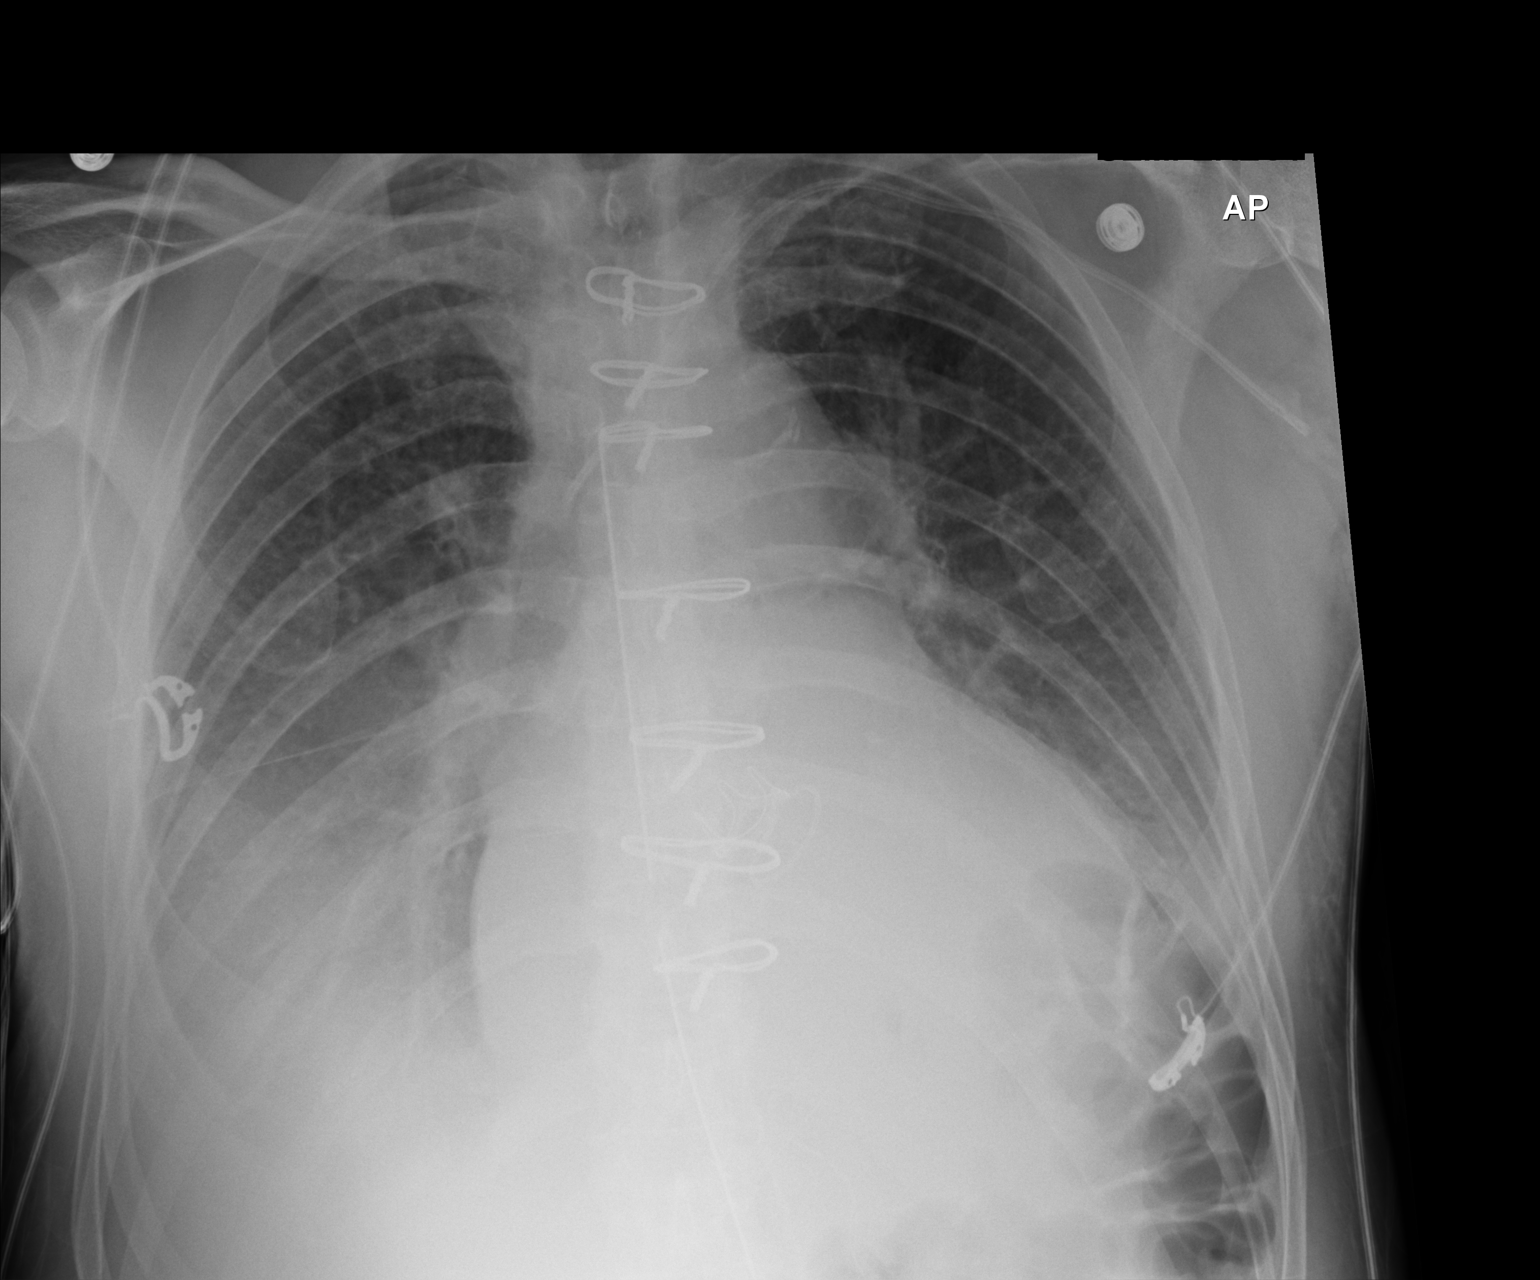

[1 of 1 positions shown; findings below may reference images not displayed]

FINDINGS: Endotracheal tube, nasogastric tube and right IJ catheter sheath
have been removed. Mediastinal drain and left subclavian central
line remain in place. Seven intact sternotomy wires. Heart is
enlarged, stable. Basilar dependent airspace disease bilaterally
with bilateral pleural effusions, stable. Less interstitial
prominence and indistinctness than on the prior study.
IMPRESSION: 1. Basilar dependent airspace disease and bilateral effusions,
stable.
2. Improving interstitial prominence and indistinctness, suggesting
improving edema.

## 2015-07-09 IMAGING — CR DG CHEST 1V PORT
1 series · 1 of 1 positions shown · non-contrast
Comparison: Prior chest x-ray 06/19/2014

CLINICAL DATA: 58-year-old male postop day 3 status post aortic
valve replacement and mitral valve repair. Interval removal of chest
tubes

EXAM:
PORTABLE CHEST - 1 VIEW

[AP]
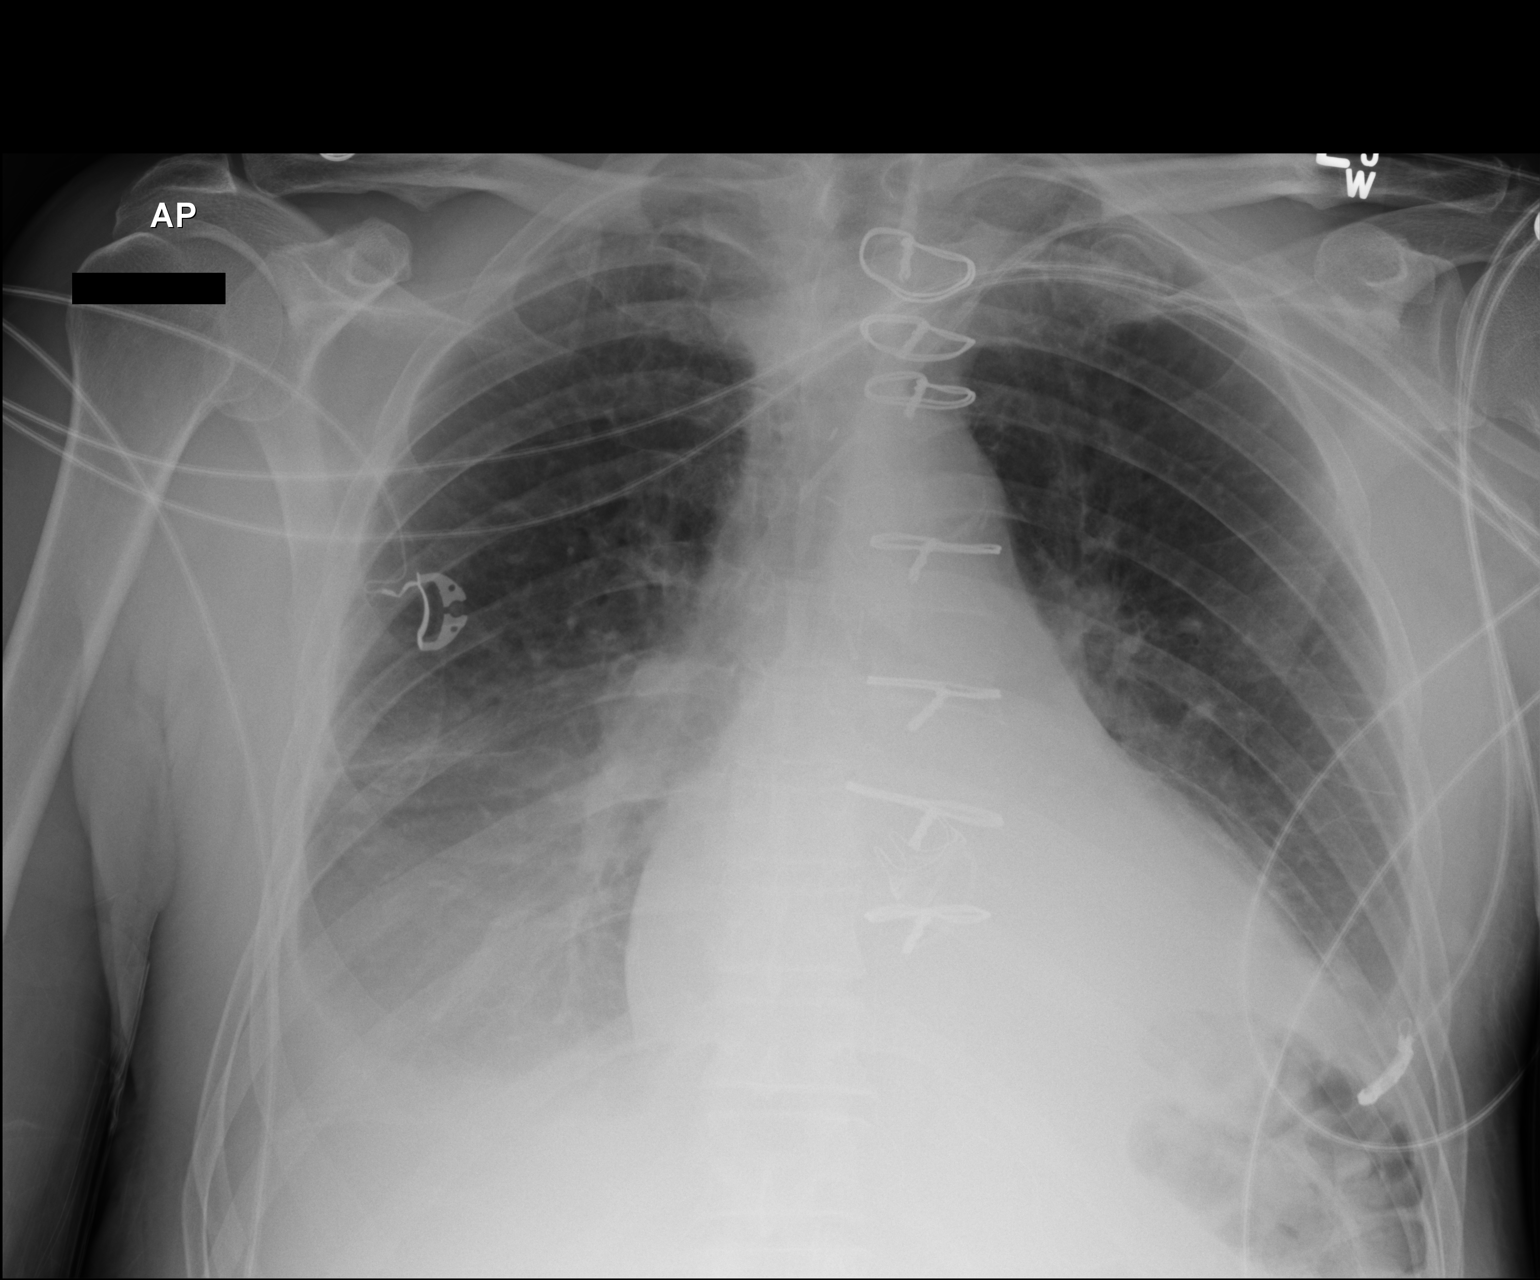

[1 of 1 positions shown; findings below may reference images not displayed]

FINDINGS: Subxiphoid chest tube has been removed. The left subclavian approach
central venous catheter remains in stable position with the tip
overlying the distal right innominate vein. Patient is status post
median sternotomy with evidence of aortic valve replacement and
mitral valve annuloplasty. Stable mild cardiomegaly. Atherosclerotic
calcifications again noted in the transverse aorta. No evidence of
pneumothorax. Persistent moderately large layering right pleural
effusion likely small layering left pleural effusion. No overt
pulmonary edema or new airspace consolidation. There is associated
atelectasis of the right lower lobe. No acute osseous abnormality.
IMPRESSION: 1. Interval removal of chest tube without evidence of pneumothorax
or other complication.
2. Persistent moderate right and small left layering pleural
effusions with associated bibasilar atelectasis.

## 2015-07-11 IMAGING — DX DG CHEST 2V
2 series · 2 of 2 positions shown · non-contrast
Comparison: Chest radiograph performed 06/20/2014

CLINICAL DATA: Status post aortic valve replacement on 06/17/2014.
Subsequent encounter.

EXAM:
CHEST  2 VIEW

[chest pa]
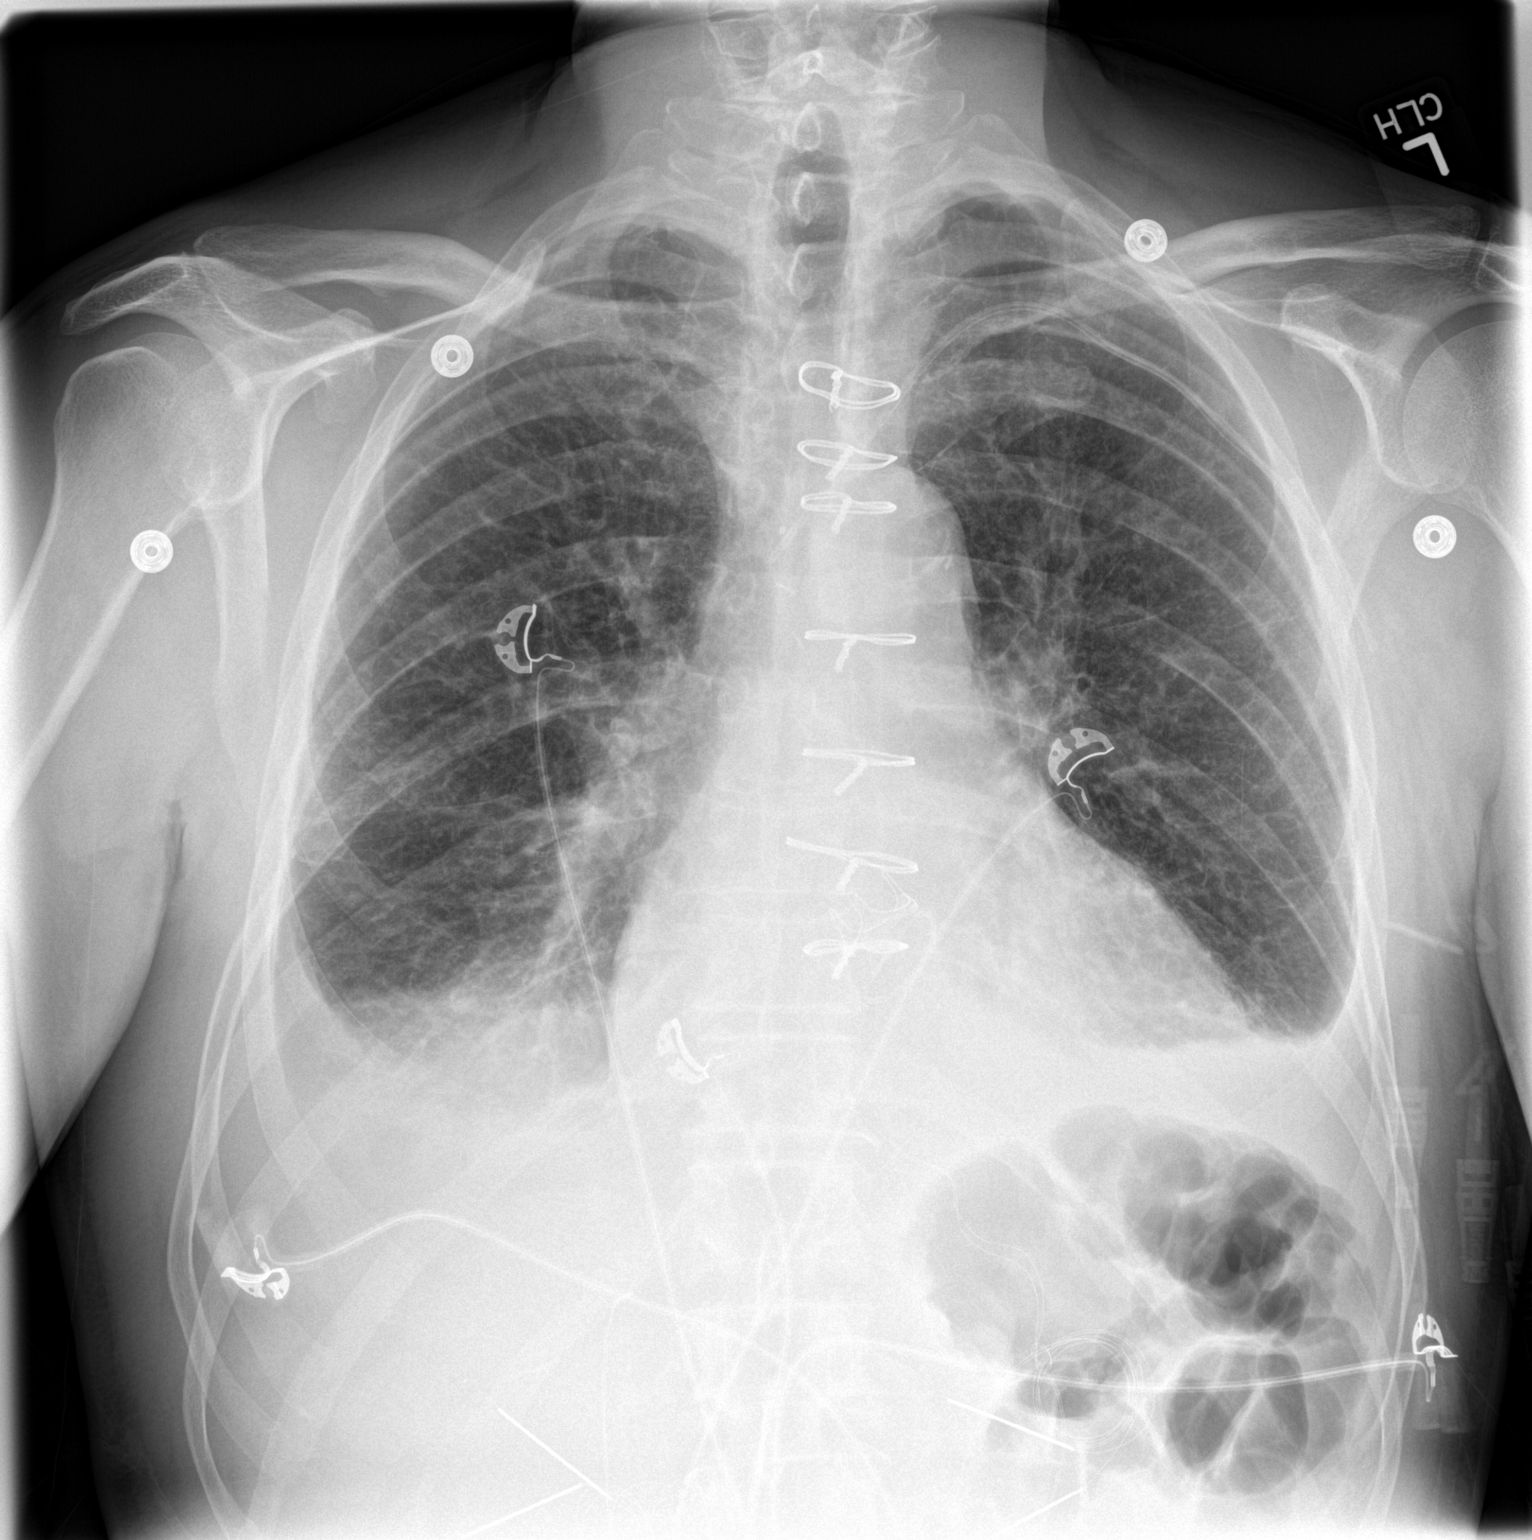

[chest lat]
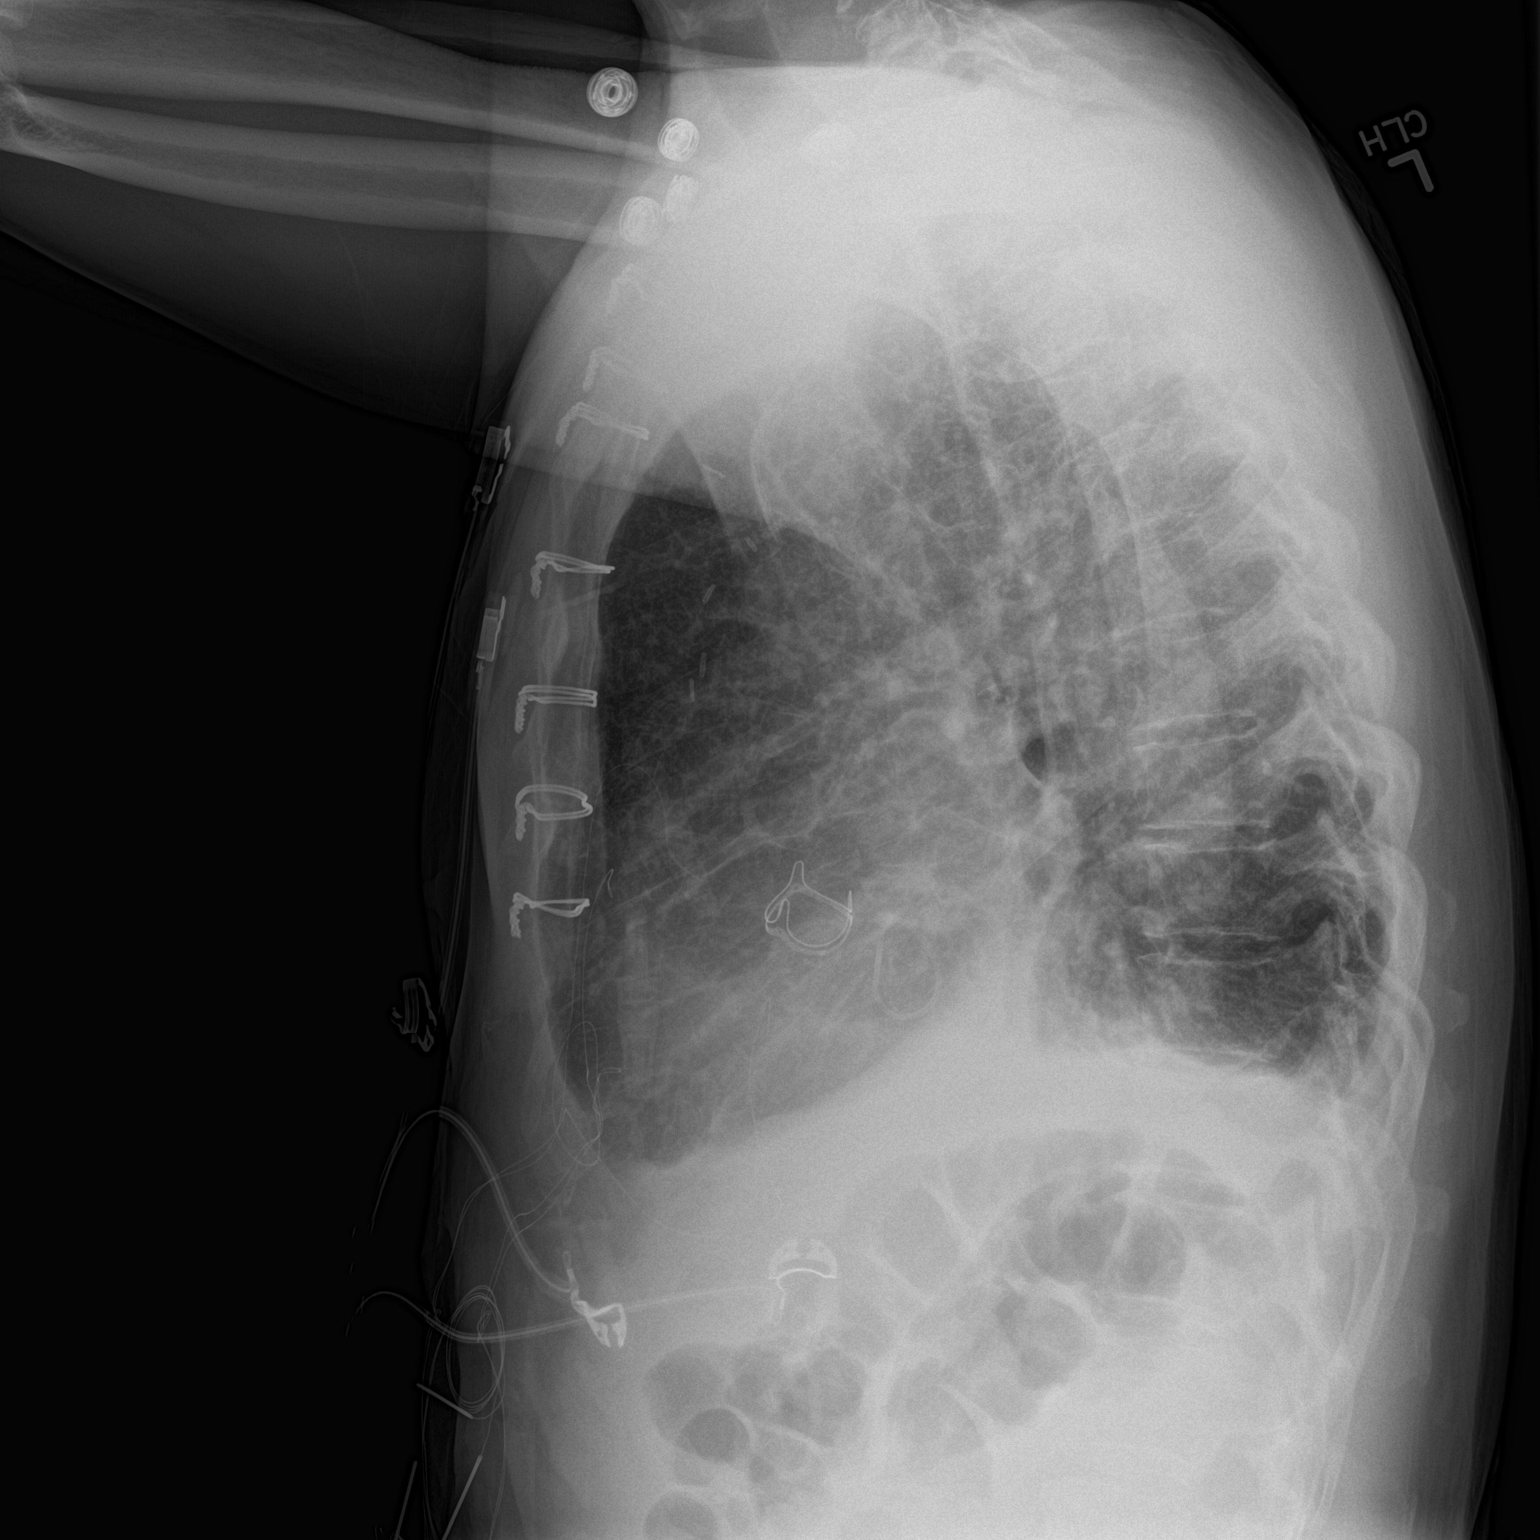

[2 of 2 positions shown; findings below may reference images not displayed]

FINDINGS: The lungs are well expanded. Small bilateral pleural effusions are
again seen, with associated atelectasis. Mildly increased
interstitial markings are seen, without definite pulmonary edema. No
pneumothorax is seen.

The cardiomediastinal silhouette is borderline normal in size. The
patient is status post median sternotomy. An aortic valve
replacement is noted. No acute osseous abnormalities are seen.
IMPRESSION: Persistent small bilateral pleural effusions, with associated
atelectasis. This is mildly improved on the right.

## 2015-08-13 ENCOUNTER — Ambulatory Visit (INDEPENDENT_AMBULATORY_CARE_PROVIDER_SITE_OTHER): Payer: BLUE CROSS/BLUE SHIELD | Admitting: Cardiovascular Disease

## 2015-08-13 ENCOUNTER — Encounter: Payer: Self-pay | Admitting: Cardiovascular Disease

## 2015-08-13 VITALS — BP 131/83 | HR 80 | Ht 67.0 in | Wt 148.0 lb

## 2015-08-13 DIAGNOSIS — I5022 Chronic systolic (congestive) heart failure: Secondary | ICD-10-CM

## 2015-08-13 DIAGNOSIS — I493 Ventricular premature depolarization: Secondary | ICD-10-CM | POA: Diagnosis not present

## 2015-08-13 DIAGNOSIS — Z952 Presence of prosthetic heart valve: Secondary | ICD-10-CM

## 2015-08-13 DIAGNOSIS — I429 Cardiomyopathy, unspecified: Secondary | ICD-10-CM

## 2015-08-13 DIAGNOSIS — I10 Essential (primary) hypertension: Secondary | ICD-10-CM

## 2015-08-13 DIAGNOSIS — I5189 Other ill-defined heart diseases: Secondary | ICD-10-CM

## 2015-08-13 DIAGNOSIS — Z954 Presence of other heart-valve replacement: Secondary | ICD-10-CM | POA: Diagnosis not present

## 2015-08-13 DIAGNOSIS — Z9889 Other specified postprocedural states: Secondary | ICD-10-CM

## 2015-08-13 DIAGNOSIS — I519 Heart disease, unspecified: Secondary | ICD-10-CM

## 2015-08-13 NOTE — Progress Notes (Signed)
Patient ID: Russell [name], male   DOB: 03-13-1956, 60 y.o.   MRN: [id number]      SUBJECTIVE: The patient presents for routine follow up. He underwent aortic valve replacement using a 21 mm Edwards Magna-Ease pericardial valve and mitral annuloplasty using a 26 mm Sorin Memo 3D ring on 06/17/14.  Echocardiogram on 01/22/15 demonstrated severely reduced left ventricular systolic function, LVEF 25-30%. There wa a mildly elevated gradient across the prosthetic aortic valve of 30 mmHg. Mitral annular ring was adequately positioned with mild regurgitation. There was severe left atrial dilatation.  He is feeling well and denies chest pain, palpitations, shortness of breath, lightheadedness, and leg swelling. His blood pressure became elevated 2 months ago when he got stressed out at work, but he has checked it daily since then and he says it has remained normal.   Review of Systems: As per "subjective", otherwise negative.  Allergies  Allergen Reactions  . Asa [Aspirin] Diarrhea and Nausea And Vomiting  . Prozac [Fluoxetine Hcl] Other (See Comments)    confusion    Current Outpatient Prescriptions  Medication Sig Dispense Refill  . albuterol (PROVENTIL HFA;VENTOLIN HFA) 108 (90 BASE) MCG/ACT inhaler Inhale 1-2 puffs into the lungs every 6 (six) hours as needed for wheezing or shortness of breath.    . ALPRAZolam (XANAX) 1 MG tablet Take 0.5-1 mg by mouth 4 (four) times daily as needed for anxiety.     Marland Kitchen aspirin EC 81 MG tablet Take 1 tablet (81 mg total) by mouth daily.    . carvedilol (COREG) 12.5 MG tablet TAKE (1) TABLET BY MOUTH TWICE DAILY. 60 tablet 6  . ferrous gluconate (FERGON) 324 MG tablet Take 1 tablet (324 mg total) by mouth 2 (two) times daily with a meal. 60 tablet 1  . furosemide (LASIX) 20 MG tablet TAKE ONE TABLET BY MOUTH ONCE DAILY. 90 tablet 0  . HYDROcodone-acetaminophen (NORCO) 10-325 MG per tablet Take 1 tablet by mouth 2 (two) times daily as needed.     Marland Kitchen lisinopril  (PRINIVIL,ZESTRIL) 5 MG tablet TAKE ONE TABLET BY MOUTH DAILY. 90 tablet 0  . Multiple Vitamin (MULTI-VITAMIN PO) Take 1 tablet by mouth 2 (two) times daily.     . potassium chloride SA (K-DUR,KLOR-CON) 20 MEQ tablet TAKE ONE TABLET BY MOUTH DAILY. 90 tablet 3   No current facility-administered medications for this visit.    Past Medical History  Diagnosis Date  . Essential hypertension   . Anxiety   . Back pain   . Cervical disc disease     C6-7  . Heart murmur   . Sleep apnea     per wife, no sleep study  . Shortness of breath dyspnea   . COPD (chronic obstructive pulmonary disease) (HCC)   . Chronic kidney disease     kidney cyst  . Arthritis   . Lung nodules     Past Surgical History  Procedure Laterality Date  . Back surgery    . Right knee surgery      Cartilage removed  . Colonoscopy  10/19/2011    Procedure: COLONOSCOPY;  Surgeon: Dalia Heading, MD;  [facility];  Service: Gastroenterology;  Laterality: N/A;  . Left and right heart catheterization with coronary angiogram N/A 05/23/2014    Procedure: LEFT AND RIGHT HEART CATHETERIZATION WITH CORONARY ANGIOGRAM;  Surgeon: Corky Crafts, MD;  Location: [facility] CATH LAB;  Service: Cardiovascular;  Laterality: N/A;  . Cardiac catheterization  05/23/14  . Aortic valve  replacement N/A 06/17/2014    Procedure: AORTIC VALVE REPLACEMENT (AVR);  Surgeon: Alleen Borne, MD;  Location[facility] OR;  Service: Open Heart Surgery;  Laterality: N/A;  . Mitral valve repair N/A 06/17/2014    Procedure: MITRAL VALVE REPAIR (MVR);  Surgeon: Alleen Borne, MD;  Location: [facility] OR;  Service: Open Heart Surgery;  Laterality: N/A;  . Tee without cardioversion N/A 06/17/2014    Procedure: TRANSESOPHAGEAL ECHOCARDIOGRAM (TEE);  Surgeon: Alleen Borne, MD;  Location: [facility] OR;  Service: Open Heart Surgery;  Laterality: N/A;    Social History   Social History  . Marital Status: Married    Spouse Name: N/A  . Number of Children: N/A  . Years  of Education: N/A   Occupational History  . Not on file.   Social History Main Topics  . Smoking status: Current Some Day Smoker -- 1.50 packs/day for 15 years    Types: Cigarettes    Start date: 06/22/1968  . Smokeless tobacco: Never Used     Comment: patient restarted smoking  . Alcohol Use: No  . Drug Use: No  . Sexual Activity: Yes   Other Topics Concern  . Not on file   Social History Narrative     Filed Vitals:   08/13/15 0901  BP: 131/83  Pulse: 80  Height:  (1.702 m)  Weight: 148 lb (67.132 kg)    PHYSICAL EXAM General: NAD HEENT: Normal. Neck: No JVD, no thyromegaly. Lungs: Clear to auscultation bilaterally with normal respiratory effort. CV: Normal rate, regular rhythm with frequent premature contractions, normal S1/S2, no S3/S4, 1/6 systolic murmur heard throughout precordium. No pretibial or periankle edema.  Abdomen: Soft, no distention.  Neurologic: Alert and oriented x 3.  Psych: Tearful. Skin: Normal. Musculoskeletal: Normal range of motion, no gross deformities. Extremities: No clubbing or cyanosis.   ECG: Most recent ECG reviewed.      ASSESSMENT AND PLAN: 1. S/p AVR and MV annuloplasty: Stable as per echocardiogram noted above. Continue ASA.  2. Cardiomyopathy with severe LV dysfunction and severe LV dilatation in the context of severe aortic stenosis, moderate aortic regurgitation, and moderate to severe mitral regurgitation: He is on Coreg and lisinopril. No evidence of heart failure. Will continue 20 mg Lasix daily. Previously placed EP referral for ICD but patient preferred to wait. Informed about risk of sudden cardiac death. However, given nonischemic etiology and recent trial data, perhaps an ICD is not warranted.  3. Essential HTN: Well controlled today. No changes.  4. Tobacco abuse disorder: He has since quit and rarely chews Nicorette gum.  5. Frequent PVC's: BMET on 09/25/14 showed K normal at 4.6. EP referral placed  for ICD. Patient wants to wait.   Dispo: f/u 1 year.  Prentice Docker, M.D., F.A.C.C.

## 2015-08-13 NOTE — Patient Instructions (Signed)
Your physician wants you to follow-up in: 1 YEAR WITH DR KONESWARAN You will receive a reminder letter in the mail two months in advance. If you don't receive a letter, please call our office to schedule the follow-up appointment.  Your physician recommends that you continue on your current medications as directed. Please refer to the Current Medication list given to you today.  Thank you for choosing [facility]!!    

## 2015-08-18 ENCOUNTER — Other Ambulatory Visit: Payer: Self-pay | Admitting: Cardiovascular Disease

## 2015-09-24 ENCOUNTER — Other Ambulatory Visit: Payer: Self-pay | Admitting: Cardiovascular Disease

## 2015-12-10 ENCOUNTER — Other Ambulatory Visit: Payer: Self-pay | Admitting: Cardiovascular Disease

## 2016-03-22 ENCOUNTER — Other Ambulatory Visit: Payer: Self-pay | Admitting: Cardiovascular Disease

## 2016-05-07 ENCOUNTER — Other Ambulatory Visit: Payer: Self-pay | Admitting: Cardiovascular Disease

## 2016-05-31 ENCOUNTER — Other Ambulatory Visit: Payer: Self-pay | Admitting: Cardiovascular Disease

## 2016-08-04 ENCOUNTER — Other Ambulatory Visit: Payer: Self-pay | Admitting: Cardiovascular Disease

## 2016-09-10 ENCOUNTER — Other Ambulatory Visit: Payer: Self-pay | Admitting: Cardiovascular Disease

## 2016-09-27 ENCOUNTER — Other Ambulatory Visit: Payer: Self-pay | Admitting: Cardiovascular Disease

## 2016-10-06 ENCOUNTER — Other Ambulatory Visit: Payer: Self-pay | Admitting: Cardiovascular Disease

## 2016-10-12 ENCOUNTER — Encounter: Payer: Self-pay | Admitting: *Deleted

## 2016-10-13 ENCOUNTER — Encounter: Payer: Self-pay | Admitting: Adult Health

## 2016-10-13 ENCOUNTER — Ambulatory Visit (INDEPENDENT_AMBULATORY_CARE_PROVIDER_SITE_OTHER): Payer: BLUE CROSS/BLUE SHIELD | Admitting: Adult Health

## 2016-10-13 VITALS — BP 148/82 | HR 117 | Ht 67.0 in | Wt 144.0 lb

## 2016-10-13 DIAGNOSIS — I429 Cardiomyopathy, unspecified: Secondary | ICD-10-CM | POA: Diagnosis not present

## 2016-10-13 DIAGNOSIS — I10 Essential (primary) hypertension: Secondary | ICD-10-CM

## 2016-10-13 MED ORDER — LISINOPRIL 5 MG PO TABS: 5.0000 mg | ORAL_TABLET | Freq: Every day | ORAL | 3 refills | Status: DC

## 2016-10-13 MED ORDER — CARVEDILOL 12.5 MG PO TABS: ORAL_TABLET | ORAL | 1 refills | Status: DC

## 2016-10-13 MED ORDER — FUROSEMIDE 20 MG PO TABS: 20.0000 mg | ORAL_TABLET | Freq: Every day | ORAL | 1 refills | Status: DC

## 2016-10-13 MED ORDER — POTASSIUM CHLORIDE CRYS ER 20 MEQ PO TBCR: 20.0000 meq | EXTENDED_RELEASE_TABLET | Freq: Every day | ORAL | 3 refills | Status: DC

## 2016-10-13 NOTE — Patient Instructions (Addendum)
Your physician recommends that you schedule a follow-up appointment in: 1 MONTH WITH DR Purvis Sheffield  Your physician recommends that you continue on your current medications as directed. Please refer to the Current Medication list given to you today.  Your physician has requested that you have an echocardiogram. Echocardiography is a painless test that uses sound waves to create images of your heart. It provides your doctor with information about the size and shape of your heart and how well your heart's chambers and valves are working. This procedure takes approximately one hour. There are no restrictions for this procedure.  Your physician has requested that you regularly monitor and record your blood pressure readings at home. Please use the same machine at the same time of day to check your readings and record them to bring to your follow-up visit.  Your physician recommends that you return for lab work BMP  Thank you for choosing [facility]!!

## 2016-10-13 NOTE — Progress Notes (Signed)
Cardiology Office Note   Date:  10/13/2016   ID:  CARDEN TEEL, DOB 08-Sep-1955, MRN [id number]  PCP:  Milana Obey, MD  Cardiologist:  Purvis Sheffield MD  Chief Complaint  Patient presents with  . Cardiomyopathy  . Hypertension      History of Present Illness: Russell [name] is a 61 y.o. male who presents for ongoing assessment and management of AoV replacement using a 21 mm Edwards Magna-Ease pericardial valve and mitral annuloplasty using a 26 mm Sorin Memo 3D ring on 06/17/14, cardiomyopathy with severe LV dysfunction. ICD was discussed on last office visit with Dr. Purvis Sheffield on 02/06/2015. Other history includes tobacco abuse, and hypertension. He is here for one year follow up.   He has been out of his medications for 4 days. States that he has been working and had not been able to come to appointments. He states he is very tired, but denies racing HR, dizziness or dyspnea; He unfortunately continue to smoke. He works full time   Past Medical History:  Diagnosis Date  . Anxiety   . Arthritis   . Back pain   . Cervical disc disease    C6-7  . Chronic kidney disease    kidney cyst  . COPD (chronic obstructive pulmonary disease) (HCC)   . Essential hypertension   . Heart murmur   . Lung nodules   . Shortness of breath dyspnea   . Sleep apnea    per wife, no sleep study    Past Surgical History:  Procedure Laterality Date  . AORTIC VALVE REPLACEMENT N/A 06/17/2014   Procedure: AORTIC VALVE REPLACEMENT (AVR);  Surgeon: Alleen Borne, MD;  Location: [facility] OR;  Service: Open Heart Surgery;  Laterality: N/A;  . BACK SURGERY    . CARDIAC CATHETERIZATION  05/23/14  . COLONOSCOPY  10/19/2011   Procedure: COLONOSCOPY;  Surgeon: Dalia Heading, MD;  [facility];  Service: Gastroenterology;  Laterality: N/A;  . LEFT AND RIGHT HEART CATHETERIZATION WITH CORONARY ANGIOGRAM N/A 05/23/2014   Procedure: LEFT AND RIGHT HEART CATHETERIZATION WITH CORONARY ANGIOGRAM;   Surgeon: Corky Crafts, MD;  Location: [facility] CATH LAB;  Service: Cardiovascular;  Laterality: N/A;  . MITRAL VALVE REPAIR N/A 06/17/2014   Procedure: MITRAL VALVE REPAIR (MVR);  Surgeon: Alleen Borne, MD;  Location: [facility] OR;  Service: Open Heart Surgery;  Laterality: N/A;  . Right knee surgery     Cartilage removed  . TEE WITHOUT CARDIOVERSION N/A 06/17/2014   Procedure: TRANSESOPHAGEAL ECHOCARDIOGRAM (TEE);  Surgeon: Alleen Borne, MD;  Location: [facility] OR;  Service: Open Heart Surgery;  Laterality: N/A;     Current Outpatient Prescriptions  Medication Sig Dispense Refill  . albuterol (PROVENTIL HFA;VENTOLIN HFA) 108 (90 BASE) MCG/ACT inhaler Inhale 1-2 puffs into the lungs every 6 (six) hours as needed for wheezing or shortness of breath.    . ALPRAZolam (XANAX) 1 MG tablet Take 0.5-1 mg by mouth 4 (four) times daily as needed for anxiety.     Marland Kitchen aspirin EC 81 MG tablet Take 1 tablet (81 mg total) by mouth daily.    . carvedilol (COREG) 12.5 MG tablet TAKE (1) TABLET BY MOUTH TWICE DAILY. 180 tablet 1  . ferrous gluconate (FERGON) 324 MG tablet Take 1 tablet (324 mg total) by mouth 2 (two) times daily with a meal. 60 tablet 1  . furosemide (LASIX) 20 MG tablet Take 1 tablet (20 mg total) by mouth daily. 90 tablet 1  . HYDROcodone-acetaminophen (  NORCO) 10-325 MG per tablet Take 1 tablet by mouth 2 (two) times daily as needed.     Marland Kitchen lisinopril (PRINIVIL,ZESTRIL) 5 MG tablet Take 1 tablet (5 mg total) by mouth daily. 90 tablet 3  . Multiple Vitamin (MULTI-VITAMIN PO) Take 1 tablet by mouth 2 (two) times daily.     . potassium chloride SA (K-DUR,KLOR-CON) 20 MEQ tablet Take 1 tablet (20 mEq total) by mouth daily. 90 tablet 3   No current facility-administered medications for this visit.     Allergies:   Asa [aspirin] and Prozac [fluoxetine hcl]    Social History:  The patient  reports that he has been smoking Cigarettes.  He started smoking about 48 years ago. He has a 22.50 pack-year smoking  history. He has never used smokeless tobacco. He reports that he does not drink alcohol or use drugs.   Family History:  The patient's family history includes Atrial fibrillation in his mother; Heart disease in his father and maternal grandfather.    ROS: All other systems are reviewed and negative. Unless otherwise mentioned in H&P    PHYSICAL EXAM: VS:  BP (!) 148/82   Pulse (!) 117   Ht  (1.702 m)   Wt 144 lb (65.3 kg)   SpO2 96%   BMI 22.55 kg/m  , BMI Body mass index is 22.55 kg/m. GEN: Well nourished, well developed, in no acute distress  HEENT: normal  Neck: no JVD, carotid bruits, or masses Cardiac: IRRR; no murmurs, rubs, or gallops,no edema  Respiratory:  Some bibasilar crackles, no wheezes.  GI: soft, nontender, nondistended, + BS MS: no deformity or atrophy Diminished pulses in the PT.  Skin: warm and dry, no rash Neuro:  Strength and sensation are intact Psych: euthymic mood, full affect   EKG:  SR with frequent PVC's, HR 117 bpm  Recent Labs: No results found for requested labs within last 8760 hours.    Lipid Panel    Component Value Date/Time   CHOL 183 05/16/2014 1033   TRIG 125 05/16/2014 1033   HDL 65 05/16/2014 1033   CHOLHDL 2.8 05/16/2014 1033   VLDL 25 05/16/2014 1033   LDLCALC 93 05/16/2014 1033      Wt Readings from Last 3 Encounters:  10/13/16 144 lb (65.3 kg)  08/13/15 148 lb (67.1 kg)  02/06/15 142 lb (64.4 kg)      Other studies Reviewed:  Echocardiogram 01/22/2015 Left ventricle: The cavity size was normal. Wall thickness was   increased in a pattern of mild LVH. Systolic function was   severely reduced. The estimated ejection fraction was in the   range of 25% to 30%. Diffuse hypokinesis. Cannot assess diastolc   function in setting of annular ring. - Aortic valve: There isa 21 mm Edwards Magna-Ease pericardial   valve in the AV position. There is a mildly elevated gradient   acroess the prosthetic valve (standardized  mean gradient 20 +/- 9   mmHg) of 30 mmHg. DVI ratio of 0.31. - Mitral valve: 26 mm Sorin Memo 3D ring in the MV anular position.   There was mild regurgitation. - Left atrium: The atrium was severely dilated. - Frequent PVCs noted during examination. - Technically adequate study.  Cardiac catheterization: 05/23/2014 IMPRESSIONS:  1. Normal left main coronary artery. 2. Mild disease in the left anterior descending artery and its branches. 3. Widely patent   left circumflex artery and its branches. 4. Widely patent right coronary artery. 5. Left ventricular systolic function  not assessed.  Mild pulmonary hypertension.   PA saturation 64%. Aortic saturation 90%. Cardiac output 4.5 L/m. Cardiac index 2.5.    ASSESSMENT AND PLAN:  1.  NICM: Reduced EF per echo in 2016. He has run out of his medications, and has been feeling more tired. Denies near syncope or chest pain. I will refill coreg, lisinopril, lasix and potassium. Repeat his echocardiogram. He continues to refuse ICD. Optimize medical therapy. May need to add digoxin if creatinine is normal. He will follow back up in one month with an EKG to evaluate his HR and response to medications.    2. Hypertension: BP is not optimal for reduced EF. As he has not been taking his medications, refills are provided. He is asked to take BP at home and record. He will bring this with him on next appointment. Repeat BMET.  3. Ongoing tobacco abuse: Discussed cessation with patient >3 minutes, He verbalizes understanding. States stress at work makes it harder for him to quit.    Current medicines are reviewed at length with the patient today.    Labs/ tests ordered today include:   Orders Placed This Encounter  Procedures  . Basic Metabolic Panel (BMET)  . EKG 12-Lead  . ECHOCARDIOGRAM COMPLETE     Disposition:   FU with one month with EKG  Signed, Joni Reining, NP  10/13/2016 10:03 AM     [facility] [id number] [place], [place] [address] Phone: [phone]; Fax: [phone]

## 2016-11-11 ENCOUNTER — Other Ambulatory Visit: Payer: Self-pay | Admitting: Cardiovascular Disease

## 2016-11-11 ENCOUNTER — Other Ambulatory Visit: Payer: Self-pay

## 2016-11-11 ENCOUNTER — Ambulatory Visit (INDEPENDENT_AMBULATORY_CARE_PROVIDER_SITE_OTHER): Payer: BLUE CROSS/BLUE SHIELD

## 2016-11-11 DIAGNOSIS — I429 Cardiomyopathy, unspecified: Secondary | ICD-10-CM | POA: Diagnosis not present

## 2016-11-11 NOTE — Telephone Encounter (Signed)
° ° ° °  1. Which medications need to be refilled? (please list name of each medication and dose if known)  carvedilol (COREG) 12.5 MG tablet   2. Which pharmacy/location (including street and city if local pharmacy) is medication to be sent to? [facility]   3. Do they need a 30 day or 90 day supply?  90 day supply.

## 2016-11-11 NOTE — Telephone Encounter (Signed)
Carvedilol was sent on 10/13/16 w/90 day supply

## 2016-11-12 ENCOUNTER — Ambulatory Visit (INDEPENDENT_AMBULATORY_CARE_PROVIDER_SITE_OTHER): Payer: BLUE CROSS/BLUE SHIELD | Admitting: Cardiovascular Disease

## 2016-11-12 ENCOUNTER — Encounter: Payer: Self-pay | Admitting: Cardiovascular Disease

## 2016-11-12 VITALS — BP 161/96 | HR 66 | Ht 66.0 in | Wt 140.6 lb

## 2016-11-12 DIAGNOSIS — Z952 Presence of prosthetic heart valve: Secondary | ICD-10-CM

## 2016-11-12 DIAGNOSIS — Z9889 Other specified postprocedural states: Secondary | ICD-10-CM | POA: Diagnosis not present

## 2016-11-12 DIAGNOSIS — I429 Cardiomyopathy, unspecified: Secondary | ICD-10-CM | POA: Diagnosis not present

## 2016-11-12 DIAGNOSIS — I5022 Chronic systolic (congestive) heart failure: Secondary | ICD-10-CM

## 2016-11-12 DIAGNOSIS — I493 Ventricular premature depolarization: Secondary | ICD-10-CM

## 2016-11-12 DIAGNOSIS — I10 Essential (primary) hypertension: Secondary | ICD-10-CM | POA: Diagnosis not present

## 2016-11-12 NOTE — Patient Instructions (Signed)

## 2016-11-12 NOTE — Progress Notes (Signed)
SUBJECTIVE: The patient presents for routine follow-up.  Echocardiogram performed yesterday showed moderately reduced left ventricle systolic function, LVEF 30-35% in the setting of relatively frequent PVCs, severe left atrial enlargement, mitral ring with trivial regurgitation, mean gradient 3 mmHg, bioprosthetic aortic valve with mildly increased gradient, 37 mmHg.  He saw K. Lawrence, NP on 10/13/16 and was complaining of fatigue. He had run out of his medications. They were refilled. A basic metabolic panel was ordered but not obtained.  He feels very well. He works 8 hours a day, 7 days a week. He climbs 17 steps 30-50 times daily without difficulty. He also exercises and does strength training at home.  He denies chest pain, palpitations, shortness of breath, and leg swelling.  He brought in his low pressure log which I personally reviewed. Blood pressures have consistently remained normal.  Soc: Married for over 40 years. Works at a [employer] in [place].   Review of Systems: As per "subjective", otherwise negative.  Allergies  Allergen Reactions  . Asa [Aspirin] Diarrhea and Nausea And Vomiting  . Prozac [Fluoxetine Hcl] Other (See Comments)    confusion    Current Outpatient Prescriptions  Medication Sig Dispense Refill  . albuterol (PROVENTIL HFA;VENTOLIN HFA) 108 (90 BASE) MCG/ACT inhaler Inhale 1-2 puffs into the lungs every 6 (six) hours as needed for wheezing or shortness of breath.    . ALPRAZolam (XANAX) 1 MG tablet Take 0.5-1 mg by mouth 4 (four) times daily as needed for anxiety.     Marland Kitchen. aspirin EC 81 MG tablet Take 1 tablet (81 mg total) by mouth daily.    . carvedilol (COREG) 12.5 MG tablet TAKE (1) TABLET BY MOUTH TWICE DAILY. 180 tablet 1  . ferrous gluconate (FERGON) 324 MG tablet Take 1 tablet (324 mg total) by mouth 2 (two) times daily with a meal. 60 tablet 1  . furosemide (LASIX) 20 MG tablet Take 1 tablet (20 mg total) by mouth daily. 90  tablet 1  . HYDROcodone-acetaminophen (NORCO) 10-325 MG per tablet Take 1 tablet by mouth 2 (two) times daily as needed.     Marland Kitchen. lisinopril (PRINIVIL,ZESTRIL) 5 MG tablet Take 1 tablet (5 mg total) by mouth daily. 90 tablet 3  . Multiple Vitamin (MULTI-VITAMIN PO) Take 1 tablet by mouth 2 (two) times daily.     . potassium chloride SA (K-DUR,KLOR-CON) 20 MEQ tablet Take 1 tablet (20 mEq total) by mouth daily. 90 tablet 3   No current facility-administered medications for this visit.     Past Medical History:  Diagnosis Date  . Anxiety   . Arthritis   . Back pain   . Cervical disc disease    C6-7  . Chronic kidney disease    kidney cyst  . COPD (chronic obstructive pulmonary disease) (HCC)   . Essential hypertension   . Heart murmur   . Lung nodules   . Shortness of breath dyspnea   . Sleep apnea    per wife, no sleep study    Past Surgical History:  Procedure Laterality Date  . AORTIC VALVE REPLACEMENT N/A 06/17/2014   Procedure: AORTIC VALVE REPLACEMENT (AVR);  Surgeon: Alleen BorneBryan K Bartle, MD;  [facility];  Service: Open Heart Surgery;  Laterality: N/A;  . BACK SURGERY    . CARDIAC CATHETERIZATION  05/23/14  . COLONOSCOPY  10/19/2011   Procedure: COLONOSCOPY;  Surgeon: Dalia HeadingMark A Jenkins, MD;  [facility];  Service: Gastroenterology;  Laterality: N/A;  .  LEFT AND RIGHT HEART CATHETERIZATION WITH CORONARY ANGIOGRAM N/A 05/23/2014   Procedure: LEFT AND RIGHT HEART CATHETERIZATION WITH CORONARY ANGIOGRAM;  Surgeon: Corky Crafts, MD;  Location[facility] CATH LAB;  Service: Cardiovascular;  Laterality: N/A;  . MITRAL VALVE REPAIR N/A 06/17/2014   Procedure: MITRAL VALVE REPAIR (MVR);  Surgeon: Alleen Borne, MD;  Location: [facility] OR;  Service: Open Heart Surgery;  Laterality: N/A;  . Right knee surgery     Cartilage removed  . TEE WITHOUT CARDIOVERSION N/A 06/17/2014   Procedure: TRANSESOPHAGEAL ECHOCARDIOGRAM (TEE);  Surgeon: Alleen Borne, MD;  Location: [facility] OR;  Service: Open  Heart Surgery;  Laterality: N/A;    Social History   Social History  . Marital status: Married    Spouse name: N/A  . Number of children: N/A  . Years of education: N/A   Occupational History  . Not on file.   Social History Main Topics  . Smoking status: Current Every Day Smoker    Packs/day: 0.25    Years: 15.00    Types: Cigarettes    Start date: 06/22/1968  . Smokeless tobacco: Never Used     Comment: patient restarted smoking  . Alcohol use No  . Drug use: No  . Sexual activity: Yes   Other Topics Concern  . Not on file   Social History Narrative  . No narrative on file     Vitals:   11/12/16 1048  BP: (!) 161/96  Pulse: 66  SpO2: 98%  Weight: 140 lb 9.6 oz (63.8 kg)  Height: 5\' 6"  (1.676 m)    Wt Readings from Last 3 Encounters:  11/12/16 140 lb 9.6 oz (63.8 kg)  10/13/16 144 lb (65.3 kg)  08/13/15 148 lb (67.1 kg)     PHYSICAL EXAM General: NAD HEENT: Normal. Neck: No JVD, no thyromegaly. Lungs: Clear to auscultation bilaterally with normal respiratory effort. CV: Nondisplaced PMI.  Regular rate and rhythm, normal S1/S2, no S3/S4, no murmur. No pretibial or periankle edema.  No carotid bruit.   Abdomen: Soft, nontender, no distention.  Neurologic: Alert and oriented.  Psych: Normal affect. Skin: Normal. Musculoskeletal: No gross deformities.    ECG: Most recent ECG reviewed.   Labs: Lab Results  Component Value Date/Time   K 4.6 09/25/2014 03:00 PM   BUN 12 09/25/2014 03:00 PM   CREATININE 0.90 09/25/2014 03:00 PM   ALT 18 07/16/2014 12:10 PM   TSH 0.638 05/16/2014 10:33 AM   HGB 12.8 (L) 07/16/2014 12:28 PM     Lipids: Lab Results  Component Value Date/Time   LDLCALC 93 05/16/2014 10:33 AM   CHOL 183 05/16/2014 10:33 AM   TRIG 125 05/16/2014 10:33 AM   HDL 65 05/16/2014 10:33 AM       ASSESSMENT AND PLAN:  1. S/p AVR and MV annuloplasty: Stable as per echocardiogram performed yesterday. Continue aspirin.  2.  Cardiomyopathy with moderate LV dysfunction/chronic systolic heart failure: Euvolemic. Not interested in ICD. Continue lisinopril and carvedilol along with Lasix.  3. Essential HTN: Elevated in office. Blood pressure log demonstrates consistent blood pressure control. No changes to therapy. He was nervous today about echocardiogram results.  4. Frequent PVC's: Asymptomatic. Continue Coreg.    Disposition: Follow up 6 months  Prentice Docker, M.D., F.A.C.C.

## 2016-11-24 ENCOUNTER — Telehealth: Payer: Self-pay | Admitting: Cardiovascular Disease

## 2016-11-24 NOTE — Telephone Encounter (Signed)
Wife came into the office asking to have FMLA  Intermittent leave changed to state he needs at least twice a week.   Copy of paper work is in the [employer] office   Nikki,  Also stated that nurse with [employer] requesting original paperwork. She could not remember nurses name but contact number is [phone].

## 2016-11-25 NOTE — Telephone Encounter (Signed)
That would be fine 

## 2017-03-02 ENCOUNTER — Telehealth: Payer: Self-pay | Admitting: Cardiovascular Disease

## 2017-03-02 NOTE — Telephone Encounter (Signed)
Notified wife Bonita Quin) that would be fine to use the gum or patches.  Can buy these OTC.  May want to check with pharmacist in regards to dosing depends on how much he is currently smoking.  She verbalized understanding.  Patient was due for 6 mo f/u end of November.  Appointment scheduled for 03/25/2017 with Dr. Purvis Sheffield.  Wanted to come a little earlier due to having some SOB.

## 2017-03-02 NOTE — Telephone Encounter (Signed)
Patient started back smoking and would like to know if he can chew gum or wear patches that are sold in stores to help someone quit

## 2017-03-25 ENCOUNTER — Ambulatory Visit: Payer: BLUE CROSS/BLUE SHIELD | Admitting: Cardiovascular Disease

## 2017-04-18 ENCOUNTER — Encounter: Payer: Self-pay | Admitting: Cardiovascular Disease

## 2017-04-18 ENCOUNTER — Ambulatory Visit: Payer: BLUE CROSS/BLUE SHIELD | Admitting: Cardiovascular Disease

## 2017-04-18 VITALS — BP 130/88 | HR 51 | Ht 66.0 in | Wt 141.6 lb

## 2017-04-18 DIAGNOSIS — I5022 Chronic systolic (congestive) heart failure: Secondary | ICD-10-CM

## 2017-04-18 DIAGNOSIS — I429 Cardiomyopathy, unspecified: Secondary | ICD-10-CM | POA: Diagnosis not present

## 2017-04-18 DIAGNOSIS — Z952 Presence of prosthetic heart valve: Secondary | ICD-10-CM | POA: Diagnosis not present

## 2017-04-18 DIAGNOSIS — Z9889 Other specified postprocedural states: Secondary | ICD-10-CM

## 2017-04-18 DIAGNOSIS — I10 Essential (primary) hypertension: Secondary | ICD-10-CM | POA: Diagnosis not present

## 2017-04-18 DIAGNOSIS — I493 Ventricular premature depolarization: Secondary | ICD-10-CM

## 2017-04-18 NOTE — Progress Notes (Signed)
SUBJECTIVE: The patient presents for follow-up of chronic systolic heart failure and hypertension.  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  He has a prior history of tobacco abuse.  Pulmonary function testing in December 2015 demonstrated obstruction and a ventilatory defect suggestive of emphysema.  However because there was no overinflation, it was not consistent with that diagnosis.  The patient denies any symptoms of chest pain, palpitations, shortness of breath, lightheadedness, dizziness, leg swelling, orthopnea, PND, and syncope.  He works 6-7 days weekly.  He recently split wood 3 days in a row.  Blood pressure has been normal recently.    Soc: Married for over 40 years. Works at a [employer] [place]    Review of Systems: As per "subjective", otherwise negative.  Allergies  Allergen Reactions  . Asa [Aspirin] Diarrhea and Nausea And Vomiting  . Prozac [Fluoxetine Hcl] Other (See Comments)    confusion    Current Outpatient Medications  Medication Sig Dispense Refill  . albuterol (PROVENTIL HFA;VENTOLIN HFA) 108 (90 BASE) MCG/ACT inhaler Inhale 1-2 puffs into the lungs every 6 (six) hours as needed for wheezing or shortness of breath.    . ALPRAZolam (XANAX) 1 MG tablet Take 0.5-1 mg by mouth 4 (four) times daily as needed for anxiety.     Marland Kitchen. aspirin EC 81 MG tablet Take 1 tablet (81 mg total) by mouth daily.    . carvedilol (COREG) 12.5 MG tablet TAKE (1) TABLET BY MOUTH TWICE DAILY. 180 tablet 1  . ferrous gluconate (FERGON) 324 MG tablet Take 1 tablet (324 mg total) by mouth 2 (two) times daily with a meal. 60 tablet 1  . furosemide (LASIX) 20 MG tablet Take 1 tablet (20 mg total) by mouth daily. 90 tablet 1  . HYDROcodone-acetaminophen (NORCO) 10-325 MG per tablet Take 1 tablet by mouth 2 (two) times daily as needed.     Marland Kitchen. lisinopril (PRINIVIL,ZESTRIL) 5 MG tablet Take 1 tablet (5 mg total) by mouth daily. 90  tablet 3  . Multiple Vitamin (MULTI-VITAMIN PO) Take 1 tablet by mouth 2 (two) times daily.     . potassium chloride SA (K-DUR,KLOR-CON) 20 MEQ tablet Take 1 tablet (20 mEq total) by mouth daily. 90 tablet 3   No current facility-administered medications for this visit.     Past Medical History:  Diagnosis Date  . Anxiety   . Arthritis   . Back pain   . Cervical disc disease    C6-7  . Chronic kidney disease    kidney cyst  . COPD (chronic obstructive pulmonary disease) (HCC)   . Essential hypertension   . Heart murmur   . Lung nodules   . Shortness of breath dyspnea   . Sleep apnea    per wife, no sleep study    Past Surgical History:  Procedure Laterality Date  . BACK SURGERY    . CARDIAC CATHETERIZATION  05/23/14  . Right knee surgery     Cartilage removed    Social History   Socioeconomic History  . Marital status: Married    Spouse name: Not on file  . Number of children: Not on file  . Years of education: Not on file  . Highest education level: Not on file  Social Needs  . Financial resource strain: Not on file  . Food insecurity - worry: Not on file  . Food insecurity - inability: Not on file  . Transportation needs -  medical: Not on file  . Transportation needs - non-medical: Not on file  Occupational History  . Not on file  Tobacco Use  . Smoking status: Current Every Day Smoker    Packs/day: 0.25    Years: 15.00    Pack years: 3.75    Types: Cigarettes    Start date: 06/22/1968  . Smokeless tobacco: Never Used  . Tobacco comment: patient restarted smoking  Substance and Sexual Activity  . Alcohol use: No    Alcohol/week: 0.0 oz  . Drug use: No  . Sexual activity: Yes  Other Topics Concern  . Not on file  Social History Narrative  . Not on file     Vitals:   04/18/17 1108  BP: 130/88  Pulse: (!) 51  SpO2: 98%  Weight: 141 lb 9.6 oz (64.2 kg)  Height: 5\' 6"  (1.676 m)    Wt Readings from Last 3 Encounters:  04/18/17 141 lb 9.6 oz  (64.2 kg)  11/12/16 140 lb 9.6 oz (63.8 kg)  10/13/16 144 lb (65.3 kg)     PHYSICAL EXAM General: NAD HEENT: Normal. Neck: No JVD, no thyromegaly. Lungs: Clear to auscultation bilaterally with normal respiratory effort. CV: Regular rate and rhythm with premature contractions, normal S1/S2, no S3/S4, no murmur. No pretibial or periankle edema.  No carotid bruit.   Abdomen: Soft, nontender, no distention.  Neurologic: Alert and oriented.  Psych: Normal affect. Skin: Normal. Musculoskeletal: No gross deformities.    ECG: Most recent ECG reviewed.   Labs: Lab Results  Component Value Date/Time   K 4.6 09/25/2014 03:00 PM   BUN 12 09/25/2014 03:00 PM   CREATININE 0.90 09/25/2014 03:00 PM   ALT 18 07/16/2014 12:10 PM   TSH 0.638 05/16/2014 10:33 AM   HGB 12.8 (L) 07/16/2014 12:28 PM     Lipids: Lab Results  Component Value Date/Time   LDLCALC 93 05/16/2014 10:33 AM   CHOL 183 05/16/2014 10:33 AM   TRIG 125 05/16/2014 10:33 AM   HDL 65 05/16/2014 10:33 AM       ASSESSMENT AND PLAN:  1. S/p AVR and MV annuloplasty: Stable as per echocardiogram performed on 11/11/16. Continue aspirin.  2. Cardiomyopathy with moderate LV dysfunction/chronic systolic heart failure LVEF 30-35%: Euvolemic and symptomatically stable. Not interested in ICD. Continue lisinopril and carvedilol along with Lasix.  3. Essential HTN:  Diastolic blood pressure is mildly elevated.  Blood pressure is usually normal.  No changes to therapy.  4. Frequent PVC's: Asymptomatic. Continue Coreg.     Disposition: Follow up 6 months   Prentice Docker, M.D., F.A.C.C.

## 2017-04-18 NOTE — Patient Instructions (Signed)

## 2017-04-19 ENCOUNTER — Other Ambulatory Visit: Payer: Self-pay | Admitting: Cardiovascular Disease

## 2017-05-11 ENCOUNTER — Other Ambulatory Visit: Payer: Self-pay | Admitting: Cardiovascular Disease

## 2017-09-28 ENCOUNTER — Other Ambulatory Visit: Payer: Self-pay | Admitting: Cardiovascular Disease

## 2017-11-10 ENCOUNTER — Ambulatory Visit: Payer: BLUE CROSS/BLUE SHIELD | Admitting: Cardiovascular Disease

## 2017-11-10 ENCOUNTER — Encounter: Payer: Self-pay | Admitting: Cardiovascular Disease

## 2017-11-10 VITALS — BP 160/100 | HR 69 | Ht 67.0 in | Wt 136.4 lb

## 2017-11-10 DIAGNOSIS — Z952 Presence of prosthetic heart valve: Secondary | ICD-10-CM

## 2017-11-10 DIAGNOSIS — I429 Cardiomyopathy, unspecified: Secondary | ICD-10-CM | POA: Diagnosis not present

## 2017-11-10 DIAGNOSIS — I10 Essential (primary) hypertension: Secondary | ICD-10-CM | POA: Diagnosis not present

## 2017-11-10 DIAGNOSIS — I493 Ventricular premature depolarization: Secondary | ICD-10-CM

## 2017-11-10 DIAGNOSIS — I5022 Chronic systolic (congestive) heart failure: Secondary | ICD-10-CM

## 2017-11-10 DIAGNOSIS — Z9889 Other specified postprocedural states: Secondary | ICD-10-CM | POA: Diagnosis not present

## 2017-11-10 MED ORDER — LISINOPRIL 10 MG PO TABS: 10.0000 mg | ORAL_TABLET | Freq: Every day | ORAL | 3 refills | Status: DC

## 2017-11-10 NOTE — Patient Instructions (Addendum)
Your physician wants you to follow-up in: 6 months with Dr.Koneswaran You will receive a reminder letter in the mail two months in advance. If you don't receive a letter, please call our office to schedule the follow-up appointment.   INCREASE Lisinopril 10 mg daily   No lab work or tests ordered today.     If you need a refill on your cardiac medications before your next appointment, please call your pharmacy.      Thank you for choosing [facility] !

## 2017-11-10 NOTE — Progress Notes (Signed)
SUBJECTIVE: The patient presents for follow-up of chronic systolic heart failure and hypertension.  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  He has a prior history of tobacco abuse.  Pulmonary function testing in December 2015 demonstrated obstruction and a ventilatory defect suggestive of emphysema.  However because there was no overinflation, it was not consistent with that diagnosis.  He has been having significant back pain over the past 2 days.  He told me he was out of work from March 8 through April 1 due to a gastrointestinal infection leading to daily nausea and vomiting.  He lost 12 pounds within the first 2 weeks.  He has a lot of work-related stress.  He will retire in 1 year after being with a company for 38 years at that time.  He denies chest pain, leg swelling, and shortness of breath.     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   Review of Systems: As per "subjective", otherwise negative.  Allergies  Allergen Reactions  . Asa [Aspirin] Diarrhea and Nausea And Vomiting  . Prozac [Fluoxetine Hcl] Other (See Comments)    confusion    Current Outpatient Medications  Medication Sig Dispense Refill  . albuterol (PROVENTIL HFA;VENTOLIN HFA) 108 (90 BASE) MCG/ACT inhaler Inhale 1-2 puffs into the lungs every 6 (six) hours as needed for wheezing or shortness of breath.    . ALPRAZolam (XANAX) 1 MG tablet Take 0.5-1 mg by mouth 4 (four) times daily as needed for anxiety.     Marland Kitchen aspirin EC 81 MG tablet Take 1 tablet (81 mg total) by mouth daily.    . carvedilol (COREG) 12.5 MG tablet TAKE (1) TABLET BY MOUTH TWICE DAILY. 60 tablet 6  . ferrous gluconate (FERGON) 324 MG tablet Take 1 tablet (324 mg total) by mouth 2 (two) times daily with a meal. 60 tablet 1  . furosemide (LASIX) 20 MG tablet TAKE ONE TABLET BY MOUTH ONCE DAILY. 90 tablet 0  . HYDROcodone-acetaminophen (NORCO) 10-325 MG per  tablet Take 1 tablet by mouth 2 (two) times daily as needed.     Marland Kitchen lisinopril (PRINIVIL,ZESTRIL) 5 MG tablet Take 1 tablet (5 mg total) by mouth daily. 90 tablet 3  . Multiple Vitamin (MULTI-VITAMIN PO) Take 1 tablet by mouth 2 (two) times daily.     . ondansetron (ZOFRAN) 4 MG tablet     . potassium chloride SA (K-DUR,KLOR-CON) 20 MEQ tablet Take 1 tablet (20 mEq total) by mouth daily. 90 tablet 3  . fluticasone (FLONASE) 50 MCG/ACT nasal spray      No current facility-administered medications for this visit.     Past Medical History:  Diagnosis Date  . Anxiety   . Arthritis   . Back pain   . Cervical disc disease    C6-7  . Chronic kidney disease    kidney cyst  . COPD (chronic obstructive pulmonary disease) (HCC)   . Essential hypertension   . Heart murmur   . Lung nodules   . Shortness of breath dyspnea   . Sleep apnea    per wife, no sleep study    Past Surgical History:  Procedure Laterality Date  . AORTIC VALVE REPLACEMENT N/A 06/17/2014   Procedure: AORTIC VALVE REPLACEMENT (AVR);  Surgeon: Alleen Borne, MD;  Location: [facility] OR;  Service: Open Heart Surgery;  Laterality: N/A;  . BACK SURGERY    . CARDIAC CATHETERIZATION  05/23/14  .  COLONOSCOPY  10/19/2011   Procedure: COLONOSCOPY;  Surgeon: Dalia Heading, MD;  Location: [facility];  Service: Gastroenterology;  Laterality: N/A;  . LEFT AND RIGHT HEART CATHETERIZATION WITH CORONARY ANGIOGRAM N/A 05/23/2014   Procedure: LEFT AND RIGHT HEART CATHETERIZATION WITH CORONARY ANGIOGRAM;  Surgeon: Corky Crafts, MD;  Location: [facility] CATH LAB;  Service: Cardiovascular;  Laterality: N/A;  . MITRAL VALVE REPAIR N/A 06/17/2014   Procedure: MITRAL VALVE REPAIR (MVR);  Surgeon: Alleen Borne, MD;  Location: [facility] OR;  Service: Open Heart Surgery;  Laterality: N/A;  . Right knee surgery     Cartilage removed  . TEE WITHOUT CARDIOVERSION N/A 06/17/2014   Procedure: TRANSESOPHAGEAL ECHOCARDIOGRAM (TEE);  Surgeon: Alleen Borne, MD;   Location: [facility] OR;  Service: Open Heart Surgery;  Laterality: N/A;    Social History   Socioeconomic History  . Marital status: Married    Spouse name: Not on file  . Number of children: Not on file  . Years of education: Not on file  . Highest education level: Not on file  Occupational History  . Not on file  Social Needs  . Financial resource strain: Not on file  . Food insecurity:    Worry: Not on file    Inability: Not on file  . Transportation needs:    Medical: Not on file    Non-medical: Not on file  Tobacco Use  . Smoking status: Current Every Day Smoker    Packs/day: 0.25    Years: 15.00    Pack years: 3.75    Types: Cigarettes    Start date: 06/22/1968  . Smokeless tobacco: Never Used  . Tobacco comment: patient restarted smoking  Substance and Sexual Activity  . Alcohol use: No    Alcohol/week: 0.0 oz  . Drug use: No  . Sexual activity: Yes  Lifestyle  . Physical activity:    Days per week: Not on file    Minutes per session: Not on file  . Stress: Not on file  Relationships  . Social connections:    Talks on phone: Not on file    Gets together: Not on file    Attends religious service: Not on file    Active member of club or organization: Not on file    Attends meetings of clubs or organizations: Not on file    Relationship status: Not on file  . Intimate partner violence:    Fear of current or ex partner: Not on file    Emotionally abused: Not on file    Physically abused: Not on file    Forced sexual activity: Not on file  Other Topics Concern  . Not on file  Social History Narrative  . Not on file     Vitals:   11/10/17 1016  BP: (!) 160/100  Pulse: 69  SpO2: 95%  Weight: 136 lb 6.4 oz (61.9 kg)  Height:  (1.702 m)    Wt Readings from Last 3 Encounters:  11/10/17 136 lb 6.4 oz (61.9 kg)  04/18/17 141 lb 9.6 oz (64.2 kg)  11/12/16 140 lb 9.6 oz (63.8 kg)     PHYSICAL EXAM General: NAD HEENT: Normal. Neck: No JVD, no  thyromegaly. Lungs: Clear to auscultation bilaterally with normal respiratory effort. CV: Regular rate and rhythm, normal S1/S2, no S3/S4, no murmur. No pretibial or periankle edema.     Abdomen: Soft, nontender, no distention.  Neurologic: Alert and oriented.  Psych: Normal affect. Skin: Normal. Musculoskeletal: No  gross deformities.    ECG: Most recent ECG reviewed.   Labs: Lab Results  Component Value Date/Time   K 4.6 09/25/2014 03:00 PM   BUN 12 09/25/2014 03:00 PM   CREATININE 0.90 09/25/2014 03:00 PM   ALT 18 07/16/2014 12:10 PM   TSH 0.638 05/16/2014 10:33 AM   HGB 12.8 (L) 07/16/2014 12:28 PM     Lipids: Lab Results  Component Value Date/Time   LDLCALC 93 05/16/2014 10:33 AM   CHOL 183 05/16/2014 10:33 AM   TRIG 125 05/16/2014 10:33 AM   HDL 65 05/16/2014 10:33 AM       ASSESSMENT AND PLAN:  1. S/p AVR and MV annuloplasty:Stable as per echocardiogram performed on 11/11/16. Continue aspirin.  2. Cardiomyopathy withmoderateLV dysfunction/chronic systolic heart failure LVEF 30-35%:Euvolemic and symptomatically stable. Not interested in ICD. Continue lisinopril (increased to 10 mg daily) and carvedilol along with Lasix.  3. Essential HTN:  Blood pressure is markedly elevated today.  I will increase lisinopril to 10 mg daily.  It is likely being exacerbated by back pain and work-related stress.  4. Frequent PVC's:Asymptomatic. Continue Coreg.     Disposition: Follow up 6 months.   Prentice Docker, M.D., F.A.C.C.

## 2017-12-01 ENCOUNTER — Other Ambulatory Visit: Payer: Self-pay | Admitting: Cardiovascular Disease

## 2017-12-19 ENCOUNTER — Other Ambulatory Visit: Payer: Self-pay | Admitting: Cardiovascular Disease

## 2018-01-05 ENCOUNTER — Other Ambulatory Visit: Payer: Self-pay | Admitting: Cardiovascular Disease

## 2018-03-06 ENCOUNTER — Other Ambulatory Visit: Payer: Self-pay | Admitting: Cardiovascular Disease

## 2018-05-15 ENCOUNTER — Ambulatory Visit: Payer: BLUE CROSS/BLUE SHIELD | Admitting: Cardiovascular Disease

## 2018-05-15 ENCOUNTER — Encounter: Payer: Self-pay | Admitting: Cardiovascular Disease

## 2018-05-15 VITALS — BP 155/85 | HR 65 | Ht 67.0 in | Wt 136.8 lb

## 2018-05-15 DIAGNOSIS — I429 Cardiomyopathy, unspecified: Secondary | ICD-10-CM | POA: Diagnosis not present

## 2018-05-15 DIAGNOSIS — I5022 Chronic systolic (congestive) heart failure: Secondary | ICD-10-CM

## 2018-05-15 DIAGNOSIS — I10 Essential (primary) hypertension: Secondary | ICD-10-CM | POA: Diagnosis not present

## 2018-05-15 DIAGNOSIS — Z952 Presence of prosthetic heart valve: Secondary | ICD-10-CM | POA: Diagnosis not present

## 2018-05-15 DIAGNOSIS — Z9889 Other specified postprocedural states: Secondary | ICD-10-CM

## 2018-05-15 DIAGNOSIS — I493 Ventricular premature depolarization: Secondary | ICD-10-CM

## 2018-05-15 NOTE — Patient Instructions (Addendum)

## 2018-05-15 NOTE — Progress Notes (Signed)
SUBJECTIVE:  The patient presents for follow-up of chronic systolic heart failure and hypertension. 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  He has a prior history of tobacco abuse. Pulmonary function testing in December 2015 demonstrated obstruction and a ventilatory defect suggestive of emphysema. However because there was no overinflation, it was not consistent with that diagnosis.  He has a lot of work-related stress and plans to retire in September 2020.  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  He has also been under a lot of stress as his wife of 42 years recently filed for separation.    Review of Systems: As per "subjective", otherwise negative.  Allergies  Allergen Reactions  . Asa [Aspirin] Diarrhea and Nausea And Vomiting  . Prozac [Fluoxetine Hcl] Other (See Comments)    confusion    Current Outpatient Medications  Medication Sig Dispense Refill  . albuterol (PROVENTIL HFA;VENTOLIN HFA) 108 (90 BASE) MCG/ACT inhaler Inhale 1-2 puffs into the lungs every 6 (six) hours as needed for wheezing or shortness of breath.    . ALPRAZolam (XANAX) 1 MG tablet Take 0.5-1 mg by mouth 4 (four) times daily as needed for anxiety.     Marland Kitchen aspirin EC 81 MG tablet Take 1 tablet (81 mg total) by mouth daily.    . carvedilol (COREG) 12.5 MG tablet TAKE (1) TABLET BY MOUTH TWICE DAILY. 180 tablet 1  . ferrous gluconate (FERGON) 324 MG tablet Take 1 tablet (324 mg total) by mouth 2 (two) times daily with a meal. 60 tablet 1  . fluticasone (FLONASE) 50 MCG/ACT nasal spray     . furosemide (LASIX) 20 MG tablet TAKE ONE TABLET BY MOUTH ONCE DAILY. 90 tablet 1  . HYDROcodone-acetaminophen (NORCO) 10-325 MG per tablet Take 1 tablet by mouth 2 (two) times daily as needed.     Marland Kitchen lisinopril (PRINIVIL,ZESTRIL) 10 MG tablet Take 1 tablet (10 mg total) by mouth daily. 90 tablet 3  . Multiple Vitamin (MULTI-VITAMIN PO) Take 1  tablet by mouth 2 (two) times daily.     . ondansetron (ZOFRAN) 4 MG tablet     . potassium chloride SA (K-DUR,KLOR-CON) 20 MEQ tablet TAKE ONE TABLET BY MOUTH DAILY. 90 tablet 1   No current facility-administered medications for this visit.     Past Medical History:  Diagnosis Date  . Anxiety   . Arthritis   . Back pain   . Cervical disc disease    C6-7  . Chronic kidney disease    kidney cyst  . COPD (chronic obstructive pulmonary disease) (HCC)   . Essential hypertension   . Heart murmur   . Lung nodules   . Shortness of breath dyspnea   . Sleep apnea    per wife, no sleep study    Past Surgical History:  Procedure Laterality Date  . AORTIC VALVE REPLACEMENT N/A 06/17/2014   Procedure: AORTIC VALVE REPLACEMENT (AVR);  Surgeon: Alleen Borne, MD;  [facility] OR;  Service: Open Heart Surgery;  Laterality: N/A;  . BACK SURGERY    . CARDIAC CATHETERIZATION  05/23/14  . COLONOSCOPY  10/19/2011   Procedure: COLONOSCOPY;  Surgeon: Dalia Heading, MD;  Location: [facility];  Service: Gastroenterology;  Laterality: N/A;  . LEFT AND RIGHT HEART CATHETERIZATION WITH CORONARY ANGIOGRAM N/A 05/23/2014   Procedure: LEFT AND RIGHT HEART CATHETERIZATION WITH CORONARY ANGIOGRAM;  Surgeon: Corky Crafts, MD;  Location: [facility] CATH LAB;  Service: Cardiovascular;  Laterality: N/A;  . MITRAL VALVE REPAIR N/A 06/17/2014   Procedure: MITRAL VALVE REPAIR (MVR);  Surgeon: Alleen BorneBryan K Bartle, MD;  [facility];  Service: Open Heart Surgery;  Laterality: N/A;  . Right knee surgery     Cartilage removed  . TEE WITHOUT CARDIOVERSION N/A 06/17/2014   Procedure: TRANSESOPHAGEAL ECHOCARDIOGRAM (TEE);  Surgeon: Alleen BorneBryan K Bartle, MD;  [facility] OR;  Service: Open Heart Surgery;  Laterality: N/A;    Social History   Socioeconomic History  . Marital status: Married    Spouse name: Not on file  . Number of children: Not on file  . Years of education: Not on file  . Highest education level: Not on  file  Occupational History  . Not on file  Social Needs  . Financial resource strain: Not on file  . Food insecurity:    Worry: Not on file    Inability: Not on file  . Transportation needs:    Medical: Not on file    Non-medical: Not on file  Tobacco Use  . Smoking status: Current Every Day Smoker    Packs/day: 0.25    Years: 15.00    Pack years: 3.75    Types: Cigarettes    Start date: 06/22/1968  . Smokeless tobacco: Never Used  . Tobacco comment: patient restarted smoking  Substance and Sexual Activity  . Alcohol use: No    Alcohol/week: 0.0 standard drinks  . Drug use: No  . Sexual activity: Yes  Lifestyle  . Physical activity:    Days per week: Not on file    Minutes per session: Not on file  . Stress: Not on file  Relationships  . Social connections:    Talks on phone: Not on file    Gets together: Not on file    Attends religious service: Not on file    Active member of club or organization: Not on file    Attends meetings of clubs or organizations: Not on file    Relationship status: Not on file  . Intimate partner violence:    Fear of current or ex partner: Not on file    Emotionally abused: Not on file    Physically abused: Not on file    Forced sexual activity: Not on file  Other Topics Concern  . Not on file  Social History Narrative  . Not on file     Vitals:   05/15/18 0950  BP: (!) 155/85  Pulse: 65  Weight: 136 lb 12.8 oz (62.1 kg)  Height: 5\' 7"  (1.702 m)    Wt Readings from Last 3 Encounters:  05/15/18 136 lb 12.8 oz (62.1 kg)  11/10/17 136 lb 6.4 oz (61.9 kg)  04/18/17 141 lb 9.6 oz (64.2 kg)     PHYSICAL EXAM General: NAD HEENT: Normal. Neck: No JVD, no thyromegaly. Lungs: Clear to auscultation bilaterally with normal respiratory effort. CV: Regular rate and rhythm, normal S1/S2, no S3/S4, 2/6 systolic murmur along left sternal border. No pretibial or periankle edema.     Abdomen: Soft, nontender, no distention.  Neurologic:  Alert and oriented.  Psych: Normal affect. Skin: Normal. Musculoskeletal: No gross deformities.    ECG: Reviewed above under Subjective   Labs: Lab Results  Component Value Date/Time   K 4.6 09/25/2014 03:00 PM   BUN 12 09/25/2014 03:00 PM   CREATININE 0.90 09/25/2014 03:00 PM   ALT 18 07/16/2014 12:10 PM   TSH 0.638  05/16/2014 10:33 AM   HGB 12.8 (L) 07/16/2014 12:28 PM     Lipids: Lab Results  Component Value Date/Time   LDLCALC 93 05/16/2014 10:33 AM   CHOL 183 05/16/2014 10:33 AM   TRIG 125 05/16/2014 10:33 AM   HDL 65 05/16/2014 10:33 AM       ASSESSMENT AND PLAN: 1. S/p AVR and MV annuloplasty:Stable as per echocardiogram performedon 11/11/16. Continue aspirin.  2. Cardiomyopathy withmoderateLV dysfunction/chronic systolic heart failureLVEF 30-35%:Euvolemicand symptomatically stable. Not interested in ICD. Continue lisinopril and carvedilol along with Lasix.  3. Essential HTN: Blood pressure is elevated today but he is under a lot of stress.  No changes to medications at this time.   4. Frequent PVC's:Asymptomatic. Continue Coreg.    Disposition: Follow up 6 months   Prentice Docker, M.D., F.A.C.C.

## 2018-07-10 ENCOUNTER — Other Ambulatory Visit: Payer: Self-pay | Admitting: Cardiovascular Disease

## 2018-09-04 ENCOUNTER — Other Ambulatory Visit: Payer: Self-pay | Admitting: Cardiovascular Disease

## 2018-09-28 ENCOUNTER — Other Ambulatory Visit: Payer: Self-pay | Admitting: Cardiovascular Disease

## 2018-09-28 MED ORDER — FUROSEMIDE 20 MG PO TABS: 20.0000 mg | ORAL_TABLET | Freq: Every day | ORAL | 2 refills | Status: DC

## 2018-09-28 MED ORDER — POTASSIUM CHLORIDE CRYS ER 20 MEQ PO TBCR: 20.0000 meq | EXTENDED_RELEASE_TABLET | Freq: Every day | ORAL | 2 refills | Status: DC

## 2018-09-28 NOTE — Telephone Encounter (Signed)
Refilled per request.

## 2018-09-28 NOTE — Telephone Encounter (Signed)
° °  1. Which medications need to be refilled? (please list name of each medication and dose if known)  furosemide (LASIX) 20 MG tablet  Potassium   2. Which pharmacy/location (including street and city if local pharmacy) is medication to be sent to? [facility]    3. Do they need a 30 day or 90 day supply?  90

## 2018-11-07 ENCOUNTER — Telehealth: Payer: Self-pay | Admitting: *Deleted

## 2018-11-07 NOTE — Telephone Encounter (Signed)
Patient verbally consented for telehealth visits with [facility] and understands that his insurance company will be billed for the encounter.   Aware to have vitals available 

## 2018-11-10 ENCOUNTER — Telehealth (INDEPENDENT_AMBULATORY_CARE_PROVIDER_SITE_OTHER): Payer: BLUE CROSS/BLUE SHIELD | Admitting: Cardiovascular Disease

## 2018-11-10 ENCOUNTER — Encounter: Payer: Self-pay | Admitting: Cardiovascular Disease

## 2018-11-10 VITALS — BP 129/71 | HR 57 | Ht 67.0 in | Wt 135.0 lb

## 2018-11-10 DIAGNOSIS — Z9889 Other specified postprocedural states: Secondary | ICD-10-CM

## 2018-11-10 DIAGNOSIS — Z952 Presence of prosthetic heart valve: Secondary | ICD-10-CM

## 2018-11-10 DIAGNOSIS — I5022 Chronic systolic (congestive) heart failure: Secondary | ICD-10-CM | POA: Diagnosis not present

## 2018-11-10 DIAGNOSIS — I10 Essential (primary) hypertension: Secondary | ICD-10-CM

## 2018-11-10 DIAGNOSIS — I429 Cardiomyopathy, unspecified: Secondary | ICD-10-CM

## 2018-11-10 DIAGNOSIS — I493 Ventricular premature depolarization: Secondary | ICD-10-CM

## 2018-11-10 NOTE — Patient Instructions (Signed)

## 2018-11-10 NOTE — Progress Notes (Signed)
Virtual Visit via Telephone Note   This visit type was conducted due to national recommendations for restrictions regarding the COVID-19 Pandemic (e.g. social distancing) in an effort to limit this patient's exposure and mitigate transmission in our community.  Due to his co-morbid illnesses, this patient is at least at moderate risk for complications without adequate follow up.  This format is felt to be most appropriate for this patient at this time.  The patient did not have access to video technology/had technical difficulties with video requiring transitioning to audio format only (telephone).  All issues noted in this document were discussed and addressed.  No physical exam could be performed with this format.  Please refer to the patient's chart for his  consent to telehealth for [facility].   Date:  11/10/2018   ID:  Russell [name], DOB [birth year], MRN [id number]  Patient Location: Home Provider Location: Home  PCP:  Gareth Morgan, MD  Cardiologist:  Prentice Docker, MD  Electrophysiologist:  None   Evaluation Performed:  Follow-Up Visit  Chief Complaint:  CHF  History of Present Illness:    Russell [name] is a 63 y.o. male with chronic systolic heart failure and hypertension. 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  He has a prior history of tobacco abuse. Pulmonary function testing in December 2015 demonstrated obstruction and a ventilatory defect suggestive of emphysema. However because there was no overinflation, it was not consistent with that diagnosis.  He's been having some back pain as he injured it 2 weeks ago. He denies shortness of breath and leg swelling.  The patient does not have symptoms concerning for COVID-19 infection (fever, chills, cough, or new shortness of breath).   He and his wife are no longer separated.   Past Medical History:  Diagnosis Date  . Anxiety   . Arthritis   . Back pain   .  Cervical disc disease    C6-7  . Chronic kidney disease    kidney cyst  . COPD (chronic obstructive pulmonary disease) (HCC)   . Essential hypertension   . Heart murmur   . Lung nodules   . Shortness of breath dyspnea   . Sleep apnea    per wife, no sleep study   Past Surgical History:  Procedure Laterality Date  . AORTIC VALVE REPLACEMENT N/A 06/17/2014   Procedure: AORTIC VALVE REPLACEMENT (AVR);  Surgeon: Alleen Borne, MD;  Location: [facility] OR;  Service: Open Heart Surgery;  Laterality: N/A;  . BACK SURGERY    . CARDIAC CATHETERIZATION  05/23/14  . COLONOSCOPY  10/19/2011   Procedure: COLONOSCOPY;  Surgeon: Dalia Heading, MD;  [facility];  Service: Gastroenterology;  Laterality: N/A;  . LEFT AND RIGHT HEART CATHETERIZATION WITH CORONARY ANGIOGRAM N/A 05/23/2014   Procedure: LEFT AND RIGHT HEART CATHETERIZATION WITH CORONARY ANGIOGRAM;  Surgeon: Corky Crafts, MD;  Location: [facility] CATH LAB;  Service: Cardiovascular;  Laterality: N/A;  . MITRAL VALVE REPAIR N/A 06/17/2014   Procedure: MITRAL VALVE REPAIR (MVR);  Surgeon: Alleen Borne, MD;  Location: [facility] OR;  Service: Open Heart Surgery;  Laterality: N/A;  . Right knee surgery     Cartilage removed  . TEE WITHOUT CARDIOVERSION N/A 06/17/2014   Procedure: TRANSESOPHAGEAL ECHOCARDIOGRAM (TEE);  Surgeon: Alleen Borne, MD;  Location: [facility] OR;  Service: Open Heart Surgery;  Laterality: N/A;     Current Meds  Medication Sig  . albuterol (PROVENTIL HFA;VENTOLIN HFA) 108 (  90 BASE) MCG/ACT inhaler Inhale 1-2 puffs into the lungs every 6 (six) hours as needed for wheezing or shortness of breath.  . ALPRAZolam (XANAX) 1 MG tablet Take 0.5-1 mg by mouth 4 (four) times daily as needed for anxiety.   Marland Kitchen aspirin EC 81 MG tablet Take 1 tablet (81 mg total) by mouth daily.  . carvedilol (COREG) 12.5 MG tablet TAKE (1) TABLET BY MOUTH TWICE DAILY.  . ferrous gluconate (FERGON) 324 MG tablet Take 1 tablet (324 mg total) by mouth 2 (two) times  daily with a meal.  . fluticasone (FLONASE) 50 MCG/ACT nasal spray   . furosemide (LASIX) 20 MG tablet Take 1 tablet (20 mg total) by mouth daily.  Marland Kitchen HYDROcodone-acetaminophen (NORCO) 10-325 MG per tablet Take 1 tablet by mouth 2 (two) times daily as needed.   Marland Kitchen lisinopril (PRINIVIL,ZESTRIL) 10 MG tablet Take 1 tablet (10 mg total) by mouth daily.  . Multiple Vitamin (MULTI-VITAMIN PO) Take 1 tablet by mouth 2 (two) times daily.   . ondansetron (ZOFRAN) 4 MG tablet   . potassium chloride SA (K-DUR) 20 MEQ tablet Take 1 tablet (20 mEq total) by mouth daily.     Allergies:   Asa [aspirin] and Prozac [fluoxetine hcl]   Social History   Tobacco Use  . Smoking status: Current Every Day Smoker    Packs/day: 0.25    Years: 15.00    Pack years: 3.75    Types: Cigarettes    Start date: 06/22/1968  . Smokeless tobacco: Never Used  . Tobacco comment: patient restarted smoking  Substance Use Topics  . Alcohol use: No    Alcohol/week: 0.0 standard drinks  . Drug use: No     Family Hx: The patient's family history includes Atrial fibrillation in his mother; Heart disease in his father and maternal grandfather.  ROS:   Please see the history of present illness.     All other systems reviewed and are negative.   Prior CV studies:   The following studies were reviewed today:  Echocardiogram 11/11/16:  - Left ventricle: The cavity size was normal. Wall thickness was   normal. Systolic function was moderately to severely reduced. The   estimated ejection fraction was in the range of 30% to 35%.   Diffuse hypokinesis. The study is not technically sufficient to   allow evaluation of LV diastolic function. - Aortic valve: Peak gradient (S): 37 mm Hg. Peak velocity ratio of   LVOT to aortic valve: 0.41. - Mitral valve: 26 mm Sorin Memo 3D ring. Mildly thickened leaflets   . There was trivial regurgitation. Mean gradient (D): 3 mm Hg.   Valve area by pressure half-time: 1.98 cm^2. - Left  atrium: The atrium was severely dilated. - Right ventricle: Systolic function was low normal. - Right atrium: Central venous pressure (est): 3 mm Hg. - Tricuspid valve: There was trivial regurgitation. - Pulmonary arteries: PA peak pressure: 8 mm Hg (S). - Pericardium, extracardiac: There was no pericardial effusion.  Impressions:  - Normal LV wall thickness with LVEF approximately 30-35% in the   setting of relatively frequent PVCs. Indeterminate diastolic   function. Severe left atrial enlargement. Status post placement   of mitral valve ring with mildly thickened leaflets and trivial   mitral regurgitation. Normal mean mitral gradient. Status post   bioprosthetic aortic valve replacement with mildly increased   gradient. Trivial tricuspid regurgitation.  Labs/Other Tests and Data Reviewed:    EKG:  No ECG reviewed.  Recent Labs:  No results found for requested labs within last 8760 hours.   Recent Lipid Panel Lab Results  Component Value Date/Time   CHOL 183 05/16/2014 10:33 AM   TRIG 125 05/16/2014 10:33 AM   HDL 65 05/16/2014 10:33 AM   CHOLHDL 2.8 05/16/2014 10:33 AM   LDLCALC 93 05/16/2014 10:33 AM    Wt Readings from Last 3 Encounters:  11/10/18 135 lb (61.2 kg)  05/15/18 136 lb 12.8 oz (62.1 kg)  11/10/17 136 lb 6.4 oz (61.9 kg)     Objective:    Vital Signs:  BP 129/71   Pulse (!) 57   Ht 5\' 7"  (1.702 m)   Wt 135 lb (61.2 kg)   BMI 21.14 kg/m    VITAL SIGNS:  reviewed  ASSESSMENT & PLAN:    1. S/p AVR and MV annuloplasty:Stable as per echocardiogram performedon 11/11/16. Continue aspirin.  2. Cardiomyopathy withmoderateLV dysfunction/chronic systolic heart failureLVEF 30-35%:Euvolemicand symptomatically stable. Not interested in ICD. Continue lisinopril and carvedilol along with Lasix.  3. Essential ZOX:WRUEAHTN:Blood pressure is normal.  No changes to medications at this time.  4. Frequent PVC's:Asymptomatic. Continue Coreg.   COVID-19  Education: The signs and symptoms of COVID-19 were discussed with the patient and how to seek care for testing (follow up with PCP or arrange E-visit).  The importance of social distancing was discussed today.  Time:   Today, I have spent 15 minutes with the patient with telehealth technology discussing the above problems.     Medication Adjustments/Labs and Tests Ordered: Current medicines are reviewed at length with the patient today.  Concerns regarding medicines are outlined above.   Tests Ordered: No orders of the defined types were placed in this encounter.   Medication Changes: No orders of the defined types were placed in this encounter.   Disposition:  Follow up in 6 month(s)  Signed, Prentice DockerSuresh , MD  11/10/2018 10:01 AM    [facility]

## 2018-12-06 ENCOUNTER — Other Ambulatory Visit: Payer: Self-pay | Admitting: Cardiovascular Disease

## 2019-02-27 ENCOUNTER — Other Ambulatory Visit: Payer: Self-pay | Admitting: Cardiovascular Disease

## 2019-06-04 ENCOUNTER — Other Ambulatory Visit: Payer: Self-pay | Admitting: Cardiovascular Disease

## 2019-06-04 MED ORDER — LISINOPRIL 10 MG PO TABS: 10.0000 mg | ORAL_TABLET | Freq: Every day | ORAL | 0 refills | Status: DC

## 2019-06-04 MED ORDER — CARVEDILOL 12.5 MG PO TABS: ORAL_TABLET | ORAL | 0 refills | Status: DC

## 2019-06-04 NOTE — Telephone Encounter (Signed)
*  STAT* If patient is at the pharmacy, call can be transferred to refill team.   1. Which medications need to be refilled?  carvedilol (COREG) 12.5 MG tablet lisinopril (ZESTRIL) 10 MG tablet     2. Which pharmacy/location (including street and city if local pharmacy) is medication to be sent to? [facility]  3. Do they need a 30 day or 90 day supply? [place]

## 2019-06-04 NOTE — Telephone Encounter (Signed)
Medication sent to pharmacy  

## 2019-06-20 ENCOUNTER — Other Ambulatory Visit: Payer: Self-pay | Admitting: *Deleted

## 2019-06-20 MED ORDER — FUROSEMIDE 20 MG PO TABS: 20.0000 mg | ORAL_TABLET | Freq: Every day | ORAL | 0 refills | Status: DC

## 2019-06-20 MED ORDER — POTASSIUM CHLORIDE CRYS ER 20 MEQ PO TBCR: 20.0000 meq | EXTENDED_RELEASE_TABLET | Freq: Every day | ORAL | 0 refills | Status: DC

## 2019-06-21 ENCOUNTER — Other Ambulatory Visit: Payer: Self-pay | Admitting: Cardiovascular Disease

## 2019-06-29 ENCOUNTER — Other Ambulatory Visit: Payer: Self-pay | Admitting: Cardiovascular Disease

## 2019-07-21 ENCOUNTER — Other Ambulatory Visit: Payer: Self-pay | Admitting: Cardiovascular Disease

## 2019-07-30 ENCOUNTER — Other Ambulatory Visit: Payer: Self-pay | Admitting: Cardiovascular Disease

## 2019-08-18 ENCOUNTER — Other Ambulatory Visit: Payer: Self-pay | Admitting: Cardiovascular Disease

## 2019-09-03 ENCOUNTER — Other Ambulatory Visit: Payer: Self-pay | Admitting: Cardiovascular Disease

## 2019-09-20 ENCOUNTER — Other Ambulatory Visit: Payer: Self-pay | Admitting: Cardiovascular Disease

## 2019-09-25 ENCOUNTER — Ambulatory Visit: Payer: BLUE CROSS/BLUE SHIELD | Admitting: Cardiovascular Disease

## 2019-09-25 ENCOUNTER — Other Ambulatory Visit: Payer: Self-pay

## 2019-09-25 ENCOUNTER — Encounter: Payer: Self-pay | Admitting: Cardiovascular Disease

## 2019-09-25 VITALS — BP 130/78 | HR 65 | Ht 67.0 in | Wt 138.8 lb

## 2019-09-25 DIAGNOSIS — I429 Cardiomyopathy, unspecified: Secondary | ICD-10-CM

## 2019-09-25 DIAGNOSIS — I10 Essential (primary) hypertension: Secondary | ICD-10-CM

## 2019-09-25 DIAGNOSIS — Z9889 Other specified postprocedural states: Secondary | ICD-10-CM | POA: Diagnosis not present

## 2019-09-25 DIAGNOSIS — I5022 Chronic systolic (congestive) heart failure: Secondary | ICD-10-CM | POA: Diagnosis not present

## 2019-09-25 DIAGNOSIS — Z952 Presence of prosthetic heart valve: Secondary | ICD-10-CM

## 2019-09-25 DIAGNOSIS — I493 Ventricular premature depolarization: Secondary | ICD-10-CM

## 2019-09-25 NOTE — Addendum Note (Signed)
Addended by: Lesle Chris on: 09/25/2019 02:11 PM   Modules accepted: Orders

## 2019-09-25 NOTE — Patient Instructions (Signed)

## 2019-09-25 NOTE — Progress Notes (Signed)
SUBJECTIVE: Russell [name] is a 64 y.o. male with chronic systolic heart failure and hypertension. 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  He has a prior history of tobacco abuse. Pulmonary function testing in December 2015 demonstrated obstruction and a ventilatory defect suggestive of emphysema. However because there was no overinflation, it was not consistent with that diagnosis.  I personally reviewed the ECG performed today which demonstrates sinus rhythm with significant baseline artifact and PVCs.  He is here with his wife.  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  He is much less stressed out now.  He denies exertional chest pain.  His feet may swell if he does not keep them elevated for long periods of time.  He only gets short of breath when he gets anxious which is alleviated by taking an anxiolytic.    Review of Systems: As per "subjective", otherwise negative.  Allergies  Allergen Reactions  . Asa [Aspirin] Diarrhea and Nausea And Vomiting  . Prozac [Fluoxetine Hcl] Other (See Comments)    confusion    Current Outpatient Medications  Medication Sig Dispense Refill  . albuterol (PROVENTIL HFA;VENTOLIN HFA) 108 (90 BASE) MCG/ACT inhaler Inhale 1-2 puffs into the lungs every 6 (six) hours as needed for wheezing or shortness of breath.    . ALPRAZolam (XANAX) 1 MG tablet Take 0.5-1 mg by mouth 4 (four) times daily as needed for anxiety.     Marland Kitchen aspirin EC 81 MG tablet Take 1 tablet (81 mg total) by mouth daily.    . carvedilol (COREG) 12.5 MG tablet TAKE 1 TABLET BY MOUTH TWICE A DAY 60 tablet 3  . ferrous gluconate (FERGON) 324 MG tablet Take 1 tablet (324 mg total) by mouth 2 (two) times daily with a meal. 60 tablet 1  . furosemide (LASIX) 20 MG tablet TAKE 1 TABLET BY MOUTH EVERY DAY 7 tablet 0  . HYDROcodone-acetaminophen (NORCO) 10-325 MG per tablet Take 1 tablet by mouth 2 (two) times  daily as needed.     Marland Kitchen KLOR-CON M20 20 MEQ tablet TAKE 1 TABLET BY MOUTH EVERY DAY 30 tablet 0  . lisinopril (ZESTRIL) 10 MG tablet TAKE 1 TABLET BY MOUTH EVERY DAY 30 tablet 3  . Multiple Vitamin (MULTI-VITAMIN PO) Take 1 tablet by mouth 2 (two) times daily.     . ondansetron (ZOFRAN) 4 MG tablet Take 4 mg by mouth as needed.      No current facility-administered medications for this visit.    Past Medical History:  Diagnosis Date  . Anxiety   . Arthritis   . Back pain   . Cervical disc disease    C6-7  . Chronic kidney disease    kidney cyst  . COPD (chronic obstructive pulmonary disease) ([place])   . Essential hypertension   . Heart murmur   . Lung nodules   . Shortness of breath dyspnea   . Sleep apnea    per wife, no sleep study    Past Surgical History:  Procedure Laterality Date  . AORTIC VALVE REPLACEMENT N/A 06/17/2014   Procedure: AORTIC VALVE REPLACEMENT (AVR);  Surgeon: Gaye Pollack, MD;  Location: [place];  Service: Open Heart Surgery;  Laterality: N/A;  . BACK SURGERY    . CARDIAC CATHETERIZATION  05/23/14  . COLONOSCOPY  10/19/2011   Procedure: COLONOSCOPY;  Surgeon: Jamesetta So, MD;  [facility];  Service: Gastroenterology;  Laterality: N/A;  . LEFT AND RIGHT HEART CATHETERIZATION WITH CORONARY ANGIOGRAM N/A 05/23/2014   Procedure: LEFT AND RIGHT HEART CATHETERIZATION WITH CORONARY ANGIOGRAM;  Surgeon: Corky Crafts, MD;  Location: [facility] CATH LAB;  Service: Cardiovascular;  Laterality: N/A;  . MITRAL VALVE REPAIR N/A 06/17/2014   Procedure: MITRAL VALVE REPAIR (MVR);  Surgeon: Alleen Borne, MD;  Location: [facility] OR;  Service: Open Heart Surgery;  Laterality: N/A;  . Right knee surgery     Cartilage removed  . TEE WITHOUT CARDIOVERSION N/A 06/17/2014   Procedure: TRANSESOPHAGEAL ECHOCARDIOGRAM (TEE);  Surgeon: Alleen Borne, MD;  Location: [facility] OR;  Service: Open Heart Surgery;  Laterality: N/A;    Social History   Socioeconomic History  . Marital  status: Married    Spouse name: Not on file  . Number of children: Not on file  . Years of education: Not on file  . Highest education level: Not on file  Occupational History  . Not on file  Tobacco Use  . Smoking status: Current Every Day Smoker    Packs/day: 0.25    Years: 15.00    Pack years: 3.75    Types: Cigarettes    Start date: 06/22/1968  . Smokeless tobacco: Never Used  . Tobacco comment: patient restarted smoking  Substance and Sexual Activity  . Alcohol use: No    Alcohol/week: 0.0 standard drinks  . Drug use: No  . Sexual activity: Yes  Other Topics Concern  . Not on file  Social History Narrative  . Not on file   Social Determinants of Health   Financial Resource Strain:   . Difficulty of Paying Living Expenses:   Food Insecurity:   . Worried About Programme researcher, broadcasting/film/video in the Last Year:   . Barista in the Last Year:   Transportation Needs:   . Freight forwarder (Medical):   Marland Kitchen Lack of Transportation (Non-Medical):   Physical Activity:   . Days of Exercise per Week:   . Minutes of Exercise per Session:   Stress:   . Feeling of Stress :   Social Connections:   . Frequency of Communication with Friends and Family:   . Frequency of Social Gatherings with Friends and Family:   . Attends Religious Services:   . Active Member of Clubs or Organizations:   . Attends Banker Meetings:   Marland Kitchen Marital Status:   Intimate Partner Violence:   . Fear of Current or Ex-Partner:   . Emotionally Abused:   Marland Kitchen Physically Abused:   . Sexually Abused:      Vitals:   09/25/19 1334  BP: 130/78  Pulse: 65  SpO2: 97%  Weight: 138 lb 12.8 oz (63 kg)  Height: 5\' 7"  (1.702 m)    Wt Readings from Last 3 Encounters:  09/25/19 138 lb 12.8 oz (63 kg)  11/10/18 135 lb (61.2 kg)  05/15/18 136 lb 12.8 oz (62.1 kg)     PHYSICAL EXAM General: NAD HEENT: Normal. Neck: No JVD, no thyromegaly. Lungs: Clear to auscultation bilaterally with normal  respiratory effort. CV: Regular rate and rhythm, normal S1/S2, no S3/S4, no murmur. No pretibial or periankle edema.  No carotid bruit.   Abdomen: Soft, nontender, no distention.  Neurologic: Alert and oriented.  Psych: Normal affect. Skin: Normal. Musculoskeletal: No gross deformities.      Labs: Lab Results  Component Value Date/Time   K 4.6 09/25/2014 03:00 PM   BUN 12  09/25/2014 03:00 PM   CREATININE 0.90 09/25/2014 03:00 PM   ALT 18 07/16/2014 12:10 PM   TSH 0.638 05/16/2014 10:33 AM   HGB 12.8 (L) 07/16/2014 12:28 PM     Lipids: Lab Results  Component Value Date/Time   LDLCALC 93 05/16/2014 10:33 AM   CHOL 183 05/16/2014 10:33 AM   TRIG 125 05/16/2014 10:33 AM   HDL 65 05/16/2014 10:33 AM      Echocardiogram 11/11/16:  - Left ventricle: The cavity size was normal. Wall thickness was normal. Systolic function was moderately to severely reduced. The estimated ejection fraction was in the range of 30% to 35%. Diffuse hypokinesis. The study is not technically sufficient to allow evaluation of LV diastolic function. - Aortic valve: Peak gradient (S): 37 mm Hg. Peak velocity ratio of LVOT to aortic valve: 0.41. - Mitral valve: 26 mm Sorin Memo 3D ring. Mildly thickened leaflets . There was trivial regurgitation. Mean gradient (D): 3 mm Hg. Valve area by pressure half-time: 1.98 cm^2. - Left atrium: The atrium was severely dilated. - Right ventricle: Systolic function was low normal. - Right atrium: Central venous pressure (est): 3 mm Hg. - Tricuspid valve: There was trivial regurgitation. - Pulmonary arteries: PA peak pressure: 8 mm Hg (S). - Pericardium, extracardiac: There was no pericardial effusion.  Impressions:  - Normal LV wall thickness with LVEF approximately 30-35% in the setting of relatively frequent PVCs. Indeterminate diastolic function. Severe left atrial enlargement. Status post placement of mitral valve ring with mildly  thickened leaflets and trivial mitral regurgitation. Normal mean mitral gradient. Status post bioprosthetic aortic valve replacement with mildly increased gradient. Trivial tricuspid regurgitation.   ASSESSMENT AND PLAN:  1. S/p AVR and MV annuloplasty:Stable as per echocardiogram performedon 11/11/16. Continue aspirin.  I will obtain a follow-up echocardiogram.  2. Cardiomyopathy withmoderateLV dysfunction/chronic systolic heart failureLVEF 30-35%:Euvolemicand symptomatically stable. Not interested in ICD. Continue lisinopril and carvedilol along with Lasix.  I will obtain a follow-up echocardiogram.  3. Essential LJQ:GBEEF pressure isnormal. No changes to medications at this time.  4. Frequent PVC's:Asymptomatic. Continue Coreg.   Disposition: Follow up 6 months   Prentice Docker, M.D., F.A.C.C.

## 2019-09-30 ENCOUNTER — Other Ambulatory Visit: Payer: Self-pay | Admitting: Cardiovascular Disease

## 2019-10-02 ENCOUNTER — Other Ambulatory Visit: Payer: Self-pay | Admitting: Cardiovascular Disease

## 2019-10-04 ENCOUNTER — Other Ambulatory Visit: Payer: Self-pay

## 2019-10-04 ENCOUNTER — Ambulatory Visit (INDEPENDENT_AMBULATORY_CARE_PROVIDER_SITE_OTHER): Payer: Medicare HMO

## 2019-10-04 DIAGNOSIS — I429 Cardiomyopathy, unspecified: Secondary | ICD-10-CM

## 2019-10-11 ENCOUNTER — Telehealth: Payer: Self-pay | Admitting: *Deleted

## 2019-10-11 NOTE — Telephone Encounter (Signed)
-----   Message from Laqueta Linden, MD sent at 10/05/2019 12:27 PM EDT ----- Pumping function has normalized. Valves look good.

## 2019-10-11 NOTE — Telephone Encounter (Signed)
Lesle Chris, [place]  2/56/3893 7:34 PM EDT    Patient notified. Copy to pcp.

## 2020-04-04 ENCOUNTER — Ambulatory Visit: Payer: Medicare HMO | Admitting: Cardiovascular Disease

## 2020-04-06 NOTE — Progress Notes (Signed)
Cardiology Office Note  Date: 04/07/2020   ID: STORM SOVINE, DOB [birth year], MRN [id number]  PCP:  Gareth Morgan, MD  Cardiologist:  No primary care provider on file. Electrophysiologist:  None   Chief Complaint: Chronic systolic heart failure  History of Present Illness: Russell [name] is a 64 y.o. male with a history of chronic systolic heart failure, back pain, cervical disc disease, arthritis, shortness of breath, sleep apnea, lung nodules, heart murmur, HTN, COPD.  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  Last encounter with Dr. Purvis Sheffield 09/25/2019. History of aortic valve replacement, chronic systolic heart failure. tobacco abuse, pulmonary function test in December 2015 demonstrated obstructive ventilatory defect. Emphysema. He denied any exertional chest pain. Feet might swell if he doesn't keep them elevated for long periods of time. Only short of breath when he gets anxious. This is alleviated by anxiety medication.  He is here for 28-month follow-up and review of recent echocardiogram.  States he recently had both Covid vaccines and had chest pain after each 1 injection which was short-lived.  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  He continues to smoke.  Denies any PND, orthopnea, palpitations or arrhythmias, CVA or TIA-like symptoms, orthostatic symptoms, bleeding issues, claudication, DVT or PE-like symptoms.  Occasional lower extremity edema when he sits for long periods.  Past Medical History:  Diagnosis Date  . Anxiety   . Arthritis   . Back pain   . Cervical disc disease    C6-7  . Chronic kidney disease    kidney cyst  . COPD (chronic obstructive pulmonary disease) (HCC)   . Essential hypertension   . Heart murmur   . Lung nodules   . Shortness of breath dyspnea   . Sleep apnea    per  wife, no sleep study    Past Surgical History:  Procedure Laterality Date  . AORTIC VALVE REPLACEMENT N/A 06/17/2014   Procedure: AORTIC VALVE REPLACEMENT (AVR);  Surgeon: Alleen Borne, MD;  Location[facility] OR;  Service: Open Heart Surgery;  Laterality: N/A;  . BACK SURGERY    . CARDIAC CATHETERIZATION  05/23/14  . COLONOSCOPY  10/19/2011   Procedure: COLONOSCOPY;  Surgeon: Dalia Heading, MD;  [facility];  Service: Gastroenterology;  Laterality: N/A;  . LEFT AND RIGHT HEART CATHETERIZATION WITH CORONARY ANGIOGRAM N/A 05/23/2014   Procedure: LEFT AND RIGHT HEART CATHETERIZATION WITH CORONARY ANGIOGRAM;  Surgeon: Corky Crafts, MD;  Location: [facility] CATH LAB;  Service: Cardiovascular;  Laterality: N/A;  . MITRAL VALVE REPAIR N/A 06/17/2014   Procedure: MITRAL VALVE REPAIR (MVR);  Surgeon: Alleen Borne, MD;  Location: [facility] OR;  Service: Open Heart Surgery;  Laterality: N/A;  . Right knee surgery     Cartilage removed  . TEE WITHOUT CARDIOVERSION N/A 06/17/2014   Procedure: TRANSESOPHAGEAL ECHOCARDIOGRAM (TEE);  Surgeon: Alleen Borne, MD;  Location: [facility] OR;  Service: Open Heart Surgery;  Laterality: N/A;    Current Outpatient Medications  Medication Sig Dispense Refill  . albuterol (PROVENTIL HFA;VENTOLIN HFA) 108 (90 BASE) MCG/ACT inhaler Inhale 1-2 puffs into the lungs every 6 (six) hours as needed for wheezing or shortness of breath.    . ALPRAZolam (XANAX) 1 MG tablet Take 0.5-1 mg by mouth 4 (four) times daily as needed for anxiety.     Marland Kitchen aspirin EC 81 MG  tablet Take 1 tablet (81 mg total) by mouth daily.    . carvedilol (COREG) 12.5 MG tablet TAKE 1 TABLET BY MOUTH TWICE A DAY 180 tablet 2  . ferrous gluconate (FERGON) 324 MG tablet Take 1 tablet (324 mg total) by mouth 2 (two) times daily with a meal. 60 tablet 1  . furosemide (LASIX) 20 MG tablet TAKE 1 TABLET BY MOUTH EVERY DAY 90 tablet 2  . HYDROcodone-acetaminophen (NORCO) 10-325 MG per tablet Take 1 tablet by mouth 2  (two) times daily as needed.     Marland Kitchen KLOR-CON M20 20 MEQ tablet TAKE 1 TABLET BY MOUTH EVERY DAY 90 tablet 2  . lisinopril (ZESTRIL) 10 MG tablet TAKE 1 TABLET BY MOUTH EVERY DAY 90 tablet 2  . Multiple Vitamin (MULTI-VITAMIN PO) Take 1 tablet by mouth 2 (two) times daily.     . ondansetron (ZOFRAN) 4 MG tablet Take 4 mg by mouth as needed.      No current facility-administered medications for this visit.   Allergies:  Asa [aspirin] and Prozac [fluoxetine hcl]   Social History: The patient  reports that he has been smoking cigarettes. He started smoking about 51 years ago. He has a 3.75 pack-year smoking history. He has never used smokeless tobacco. He reports that he does not drink alcohol and does not use drugs.   Family History: The patient's family history includes Atrial fibrillation in his mother; Heart disease in his father and maternal grandfather.   ROS:  Please see the history of present illness. Otherwise, complete review of systems is positive for none.  All other systems are reviewed and negative.   Physical Exam: VS:  BP 138/86   Pulse 62   Ht 5\' 7"  (1.702 m)   Wt 146 lb 3.2 oz (66.3 kg)   SpO2 96%   BMI 22.90 kg/m , BMI Body mass index is 22.9 kg/m.  Wt Readings from Last 3 Encounters:  04/07/20 146 lb 3.2 oz (66.3 kg)  09/25/19 138 lb 12.8 oz (63 kg)  11/10/18 135 lb (61.2 kg)    General: Patient appears comfortable at rest. Neck: Supple, no elevated JVP or carotid bruits, no thyromegaly. Lungs: Clear to auscultation, nonlabored breathing at rest. Cardiac: Regular rate and rhythm, no S3 or significant systolic murmur, no pericardial rub. Extremities: No pitting edema, distal pulses 2+. Skin: Warm and dry. Musculoskeletal: No kyphosis. Neuropsychiatric: Alert and oriented x3, affect grossly appropriate.  ECG:  Echocardiogram 09/25/2019 sinus rhythm rate of 92.  Recent Labwork: No results found for requested labs within last 8760 hours.     Component Value  Date/Time   CHOL 183 05/16/2014 1033   TRIG 125 05/16/2014 1033   HDL 65 05/16/2014 1033   CHOLHDL 2.8 05/16/2014 1033   VLDL 25 05/16/2014 1033   LDLCALC 93 05/16/2014 1033    Other Studies Reviewed Today:  Echo 10/04/2019 IMPRESSIONS  1. Left ventricular ejection fraction, by estimation, is 65 to 70%. The  left ventricle has hyperdynamic function. The left ventricle has no  regional wall motion abnormalities. There is mild left ventricular  hypertrophy. Left ventricular diastolic  parameters are indeterminate.  2. Right ventricular systolic function is normal. The right ventricular  size is normal. There is normal pulmonary artery systolic pressure.  3. Left atrial size was moderately dilated.  4. 26 mm Sorin Memo 3D ring repair of the MV. Mild gradient across MV,  mean gradient 4 mmHg. . The mitral valve has been repaired/replaced. Mild  mitral  valve regurgitation.  5. 21 mm Edwards Magna-Ease pericardial valve in the AV position. Mean  gradient 23 mmHg, within reported normal valve for prosthetic valve. . The  aortic valve has been repaired/replaced. Aortic valve regurgitation is not  visualized. No aortic stenosis  is present.  6. The inferior vena cava is normal in size with greater than 50%  respiratory variability, suggesting right atrial pressure of 3 mmHg.   Comparison(s): Echocardiogram done 11/11/16 showed an EF of 35%.    Echocardiogram 11/11/16:  - Left ventricle: The cavity size was normal. Wall thickness was normal. Systolic function was moderately to severely reduced. The estimated ejection fraction was in the range of 30% to 35%. Diffuse hypokinesis. The study is not technically sufficient to allow evaluation of LV diastolic function. - Aortic valve: Peak gradient (S): 37 mm Hg. Peak velocity ratio of LVOT to aortic valve: 0.41. - Mitral valve: 26 mm Sorin Memo 3D ring. Mildly thickened leaflets . There was trivial regurgitation. Mean  gradient (D): 3 mm Hg. Valve area by pressure half-time: 1.98 cm^2. - Left atrium: The atrium was severely dilated. - Right ventricle: Systolic function was low normal. - Right atrium: Central venous pressure (est): 3 mm Hg. - Tricuspid valve: There was trivial regurgitation. - Pulmonary arteries: PA peak pressure: 8 mm Hg (S). - Pericardium, extracardiac: There was no pericardial effusion.  Impressions:  - Normal LV wall thickness with LVEF approximately 30-35% in the setting of relatively frequent PVCs. Indeterminate diastolic function. Severe left atrial enlargement. Status post placement of mitral valve ring with mildly thickened leaflets and trivial mitral regurgitation. Normal mean mitral gradient. Status post bioprosthetic aortic valve replacement with mildly increased gradient. Trivial tricuspid regurgitation.    Assessment and Plan:  1. S/P AVR   2. Cardiomyopathy, unspecified type (HCC)   3. Essential hypertension   4. Frequent PVCs   5. Tobacco abuse     1. S/P AVR Recent echo on 10/04/2019 demonstrated 21 mm Edwards magna ease pericardial valve in AV position.  Mean gradient of 23 mmHg.  Within report normal valve for prosthetic valve.  Aortic valve has been repaired/patient no AR visualized.  No aortic stenosis present  2. Cardiomyopathy, unspecified type (HCC)/chronic systolic heart failure Recent echo 10/04/2019 showed EF 65 to 70%, mild LVH.  This is an improvement from 2018 echo with EF of 35%.  Continue carvedilol 12.5 mg p.o. twice daily.  3. Essential hypertension Blood pressure slightly elevated today at 138/86.  Patient states at home has systolic is in the 120s and diastolic is in the 70s.  Continue lisinopril 10 mg daily, Lasix 20 mg daily.  4. Frequent PVCs No recent complaints of palpitations or arrhythmias, racing heart, skipped beats.  5.  Smoking Continues to smoke.  States he is finding it hard to quit.  Highly encourage  cessation.  Patient states he quit for about 3 months after his aortic valve replacement surgery but started back after he was fired from his job    Medication Adjustments/Labs and Tests Ordered: Current medicines are reviewed at length with the patient today.  Concerns regarding medicines are outlined above.   Disposition: Follow-up with Dr. Diona Browner or APP 6 months Signed, Rennis Harding, NP 04/07/2020 9:18 AM    [facility] at [facility] [address] [place], [place], [place] [address] Phone: [phone]; Fax: [phone]

## 2020-04-07 ENCOUNTER — Ambulatory Visit: Payer: Medicare HMO | Admitting: Family Medicine

## 2020-04-07 ENCOUNTER — Encounter: Payer: Self-pay | Admitting: Family Medicine

## 2020-04-07 VITALS — BP 138/86 | HR 62 | Ht 67.0 in | Wt 146.2 lb

## 2020-04-07 DIAGNOSIS — I10 Essential (primary) hypertension: Secondary | ICD-10-CM | POA: Diagnosis not present

## 2020-04-07 DIAGNOSIS — I493 Ventricular premature depolarization: Secondary | ICD-10-CM | POA: Diagnosis not present

## 2020-04-07 DIAGNOSIS — I429 Cardiomyopathy, unspecified: Secondary | ICD-10-CM | POA: Diagnosis not present

## 2020-04-07 DIAGNOSIS — Z720 Tobacco use: Secondary | ICD-10-CM

## 2020-04-07 DIAGNOSIS — Z952 Presence of prosthetic heart valve: Secondary | ICD-10-CM

## 2020-04-07 NOTE — Patient Instructions (Signed)
Medication Instructions:  Continue all current medications.   Labwork: none  Testing/Procedures: none  Follow-Up: 6 months   Any Other Special Instructions Will Be Listed Below (If Applicable).   If you need a refill on your cardiac medications before your next appointment, please call your pharmacy.  

## 2020-05-05 ENCOUNTER — Encounter: Payer: Self-pay | Admitting: *Deleted

## 2020-05-05 ENCOUNTER — Other Ambulatory Visit: Payer: Self-pay | Admitting: *Deleted

## 2020-05-05 MED ORDER — POTASSIUM CHLORIDE CRYS ER 20 MEQ PO TBCR: 20.0000 meq | EXTENDED_RELEASE_TABLET | Freq: Every day | ORAL | 0 refills | Status: DC

## 2020-05-05 MED ORDER — FUROSEMIDE 20 MG PO TABS: 20.0000 mg | ORAL_TABLET | Freq: Every day | ORAL | 0 refills | Status: DC

## 2020-05-05 NOTE — Telephone Encounter (Signed)
Last lab work requested from PCP (BMP)

## 2020-06-10 ENCOUNTER — Other Ambulatory Visit: Payer: Self-pay

## 2020-06-10 MED ORDER — CARVEDILOL 12.5 MG PO TABS: 12.5000 mg | ORAL_TABLET | Freq: Two times a day (BID) | ORAL | 3 refills | Status: DC

## 2020-06-10 MED ORDER — LISINOPRIL 10 MG PO TABS: 10.0000 mg | ORAL_TABLET | Freq: Every day | ORAL | 3 refills | Status: DC

## 2020-06-10 NOTE — Telephone Encounter (Signed)
Medication refill for Carvedilol 12.5 mg tablet

## 2020-07-10 ENCOUNTER — Other Ambulatory Visit: Payer: Self-pay | Admitting: Family Medicine

## 2020-08-08 ENCOUNTER — Other Ambulatory Visit: Payer: Self-pay | Admitting: Family Medicine

## 2020-08-28 ENCOUNTER — Encounter: Payer: Self-pay | Admitting: Cardiology

## 2020-08-28 DIAGNOSIS — I493 Ventricular premature depolarization: Secondary | ICD-10-CM | POA: Insufficient documentation

## 2020-08-28 NOTE — Progress Notes (Signed)
Cardiology Office Note   Date:  08/29/2020   ID:  Russell [name], DOB [birth year], MRN [id number]  PCP:  Gareth Morgan, MD  Cardiologist:   Rollene Rotunda, MD   Chief Complaint  Patient presents with  . Back Pain      History of Present Illness: Russell [name] is a 65 y.o. male who presents for evaluation of chronic systolic heart failure.  His EF had been 35% but was 65% on echo in 2021.  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  He had stable surgical replacement/repair in April 2021.   Since he was last seen he has had no new cardiac complaints.  He is limited by chronic back pain.  However, he does not report any chest discomfort.  He has no neck or arm discomfort.  He has months with shortness of breath or decreased exercise tolerance that he had at the time of his valve surgeries.  He thinks he recovered nicely from this.  He had no new complaints since his ultrasound last year.  Past Medical History:  Diagnosis Date  . Anxiety   . Arthritis   . Back pain   . Cervical disc disease    C6-7  . Chronic kidney disease    kidney cyst  . COPD (chronic obstructive pulmonary disease) (HCC)   . Essential hypertension   . Lung nodules   . Shortness of breath dyspnea   . Sleep apnea    per wife, no sleep study    Past Surgical History:  Procedure Laterality Date  . AORTIC VALVE REPLACEMENT N/A 06/17/2014   Procedure: AORTIC VALVE REPLACEMENT (AVR);  Surgeon: Alleen Borne, MD;  Location: [facility] OR;  Service: Open Heart Surgery;  Laterality: N/A;  . BACK SURGERY    . CARDIAC CATHETERIZATION  05/23/14  . COLONOSCOPY  10/19/2011   Procedure: COLONOSCOPY;  Surgeon: Dalia Heading, MD;  [facility];  Service: Gastroenterology;  Laterality: N/A;  . LEFT AND RIGHT HEART CATHETERIZATION WITH CORONARY ANGIOGRAM N/A 05/23/2014   Procedure: LEFT AND RIGHT HEART CATHETERIZATION WITH CORONARY ANGIOGRAM;  Surgeon: Corky Crafts, MD;   Location: [facility] CATH LAB;  Service: Cardiovascular;  Laterality: N/A;  . MITRAL VALVE REPAIR N/A 06/17/2014   Procedure: MITRAL VALVE REPAIR (MVR);  Surgeon: Alleen Borne, MD;  Location: [facility] OR;  Service: Open Heart Surgery;  Laterality: N/A;  . Right knee surgery     Cartilage removed  . TEE WITHOUT CARDIOVERSION N/A 06/17/2014   Procedure: TRANSESOPHAGEAL ECHOCARDIOGRAM (TEE);  Surgeon: Alleen Borne, MD;  Location: [facility] OR;  Service: Open Heart Surgery;  Laterality: N/A;     Current Outpatient Medications  Medication Sig Dispense Refill  . albuterol (PROVENTIL HFA;VENTOLIN HFA) 108 (90 BASE) MCG/ACT inhaler Inhale 1-2 puffs into the lungs every 6 (six) hours as needed for wheezing or shortness of breath.    . ALPRAZolam (XANAX) 1 MG tablet Take 0.5-1 mg by mouth 4 (four) times daily as needed for anxiety.     Marland Kitchen aspirin EC 81 MG tablet Take 1 tablet (81 mg total) by mouth daily.    . carvedilol (COREG) 12.5 MG tablet Take 1 tablet (12.5 mg total) by mouth 2 (two) times daily. 180 tablet 3  . ferrous gluconate (FERGON) 324 MG tablet Take 1 tablet (324 mg total) by mouth 2 (two) times daily with a meal. 60 tablet 1  . furosemide (LASIX) 20 MG tablet TAKE 1  TABLET BY MOUTH EVERY DAY 90 tablet 1  . HYDROcodone-acetaminophen (NORCO) 10-325 MG per tablet Take 1 tablet by mouth 2 (two) times daily as needed.     Marland Kitchen KLOR-CON M20 20 MEQ tablet TAKE 1 TABLET BY MOUTH EVERY DAY 90 tablet 1  . lisinopril (ZESTRIL) 10 MG tablet Take 1 tablet (10 mg total) by mouth daily. 90 tablet 3  . Multiple Vitamin (MULTI-VITAMIN PO) Take 1 tablet by mouth 2 (two) times daily.     . ondansetron (ZOFRAN) 4 MG tablet Take 4 mg by mouth as needed.      No current facility-administered medications for this visit.    Allergies:   Asa [aspirin] and Prozac [fluoxetine hcl]    ROS:  Please see the history of present illness.   Otherwise, review of systems are positive for back pain.   All other systems are reviewed and  negative.    PHYSICAL EXAM: VS:  BP 138/90   Pulse 60   Ht 5\' 6"  (1.676 m)   Wt 147 lb (66.7 kg)   SpO2 98%   BMI 23.73 kg/m  , BMI Body mass index is 23.73 kg/m. GENERAL:  Well appearing NECK:  No jugular venous distention, waveform within normal limits, carotid upstroke brisk and symmetric, no bruits, no thyromegaly LUNGS:  Clear to auscultation bilaterally CHEST:  Well healed surgical scars.   HEART:  PMI not displaced or sustained,S1 and S2 within normal limits, no S3, no S4, no clicks, no rubs, 3 out of 6 apical systolic murmur radiating slightly up aortic outflow tract, no diastolic murmurs ABD:  Flat, positive bowel sounds normal in frequency in pitch, no bruits, no rebound, no guarding, no midline pulsatile mass, no hepatomegaly, no splenomegaly EXT:  2 plus pulses throughout, no edema, no cyanosis no clubbing  EKG:  EKG is ordered today. The ekg ordered today demonstrates: Sinus rhythm, rate 60, axis within normal limits, intervals within normal limits, inferolateral T wave inversions slightly more pronounced than previous   Recent Labs: No results found for requested labs within last 8760 hours.    Lipid Panel    Component Value Date/Time   CHOL 183 05/16/2014 1033   TRIG 125 05/16/2014 1033   HDL 65 05/16/2014 1033   CHOLHDL 2.8 05/16/2014 1033   VLDL 25 05/16/2014 1033   LDLCALC 93 05/16/2014 1033      Wt Readings from Last 3 Encounters:  08/29/20 147 lb (66.7 kg)  04/07/20 146 lb 3.2 oz (66.3 kg)  09/25/19 138 lb 12.8 oz (63 kg)      Other studies Reviewed: Additional studies/ records that were reviewed today include: Echo 2021. Review of the above records demonstrates:  Please see elsewhere in the note.     ASSESSMENT AND PLAN:  S/P AVR This was stable on echo in April last year.  He has had no new symptoms.  No change in therapy.   Cardiomyopathy, unspecified type (HCC)/chronic systolic heart failure His ejection fraction had improved on echo  last year.  He has no new symptoms.  Continue meds as listed.  Essential hypertension His blood pressure is upper limits but typically controlled.  He gets up when he gets agitated.  No change in therapy.   Abnormal EKG The patient has an abnormal EKG is described above but he had nonobstructive disease previously and no chest pain.  There were subtle T wave inversions previous.  No further work-up.  He needs continued primary risk reduction.  Smoking He had  stopped smoking once.  We talked about the need to stop smoking again.  He thinks he will try it without any therapies.   Current medicines are reviewed at length with the patient today.  The patient does not have concerns regarding medicines.  The following changes have been made:  no change  Labs/ tests ordered today include:   Orders Placed This Encounter  Procedures  . EKG 12-Lead     Disposition:   FU with me or [place] MD in one year.     Signed, Rollene Rotunda, MD  08/29/2020 2:35 PM    [facility]

## 2020-08-29 ENCOUNTER — Ambulatory Visit: Payer: Medicare HMO | Admitting: Cardiology

## 2020-08-29 ENCOUNTER — Encounter: Payer: Self-pay | Admitting: Cardiology

## 2020-08-29 VITALS — BP 138/90 | HR 60 | Ht 66.0 in | Wt 147.0 lb

## 2020-08-29 DIAGNOSIS — I493 Ventricular premature depolarization: Secondary | ICD-10-CM

## 2020-08-29 DIAGNOSIS — I10 Essential (primary) hypertension: Secondary | ICD-10-CM

## 2020-08-29 DIAGNOSIS — Z952 Presence of prosthetic heart valve: Secondary | ICD-10-CM

## 2020-08-29 NOTE — Patient Instructions (Addendum)
Medication Instructions:   Your physician recommends that you continue on your current medications as directed. Please refer to the Current Medication list given to you today.  Labwork:  none  Testing/Procedures:  none  Follow-Up:  Your physician recommends that you schedule a follow-up appointment in: 1 year in [place]. You will receive a reminder letter in the mail in about 10 months reminding you to call and schedule your appointment. If you don't receive this letter, please contact our office.  Any Other Special Instructions Will Be Listed Below (If Applicable).  If you need a refill on your cardiac medications before your next appointment, please call your pharmacy.

## 2020-10-03 ENCOUNTER — Other Ambulatory Visit (HOSPITAL_COMMUNITY): Payer: Self-pay | Admitting: Family Medicine

## 2020-10-03 DIAGNOSIS — R112 Nausea with vomiting, unspecified: Secondary | ICD-10-CM

## 2020-10-03 DIAGNOSIS — R110 Nausea: Secondary | ICD-10-CM

## 2020-10-10 ENCOUNTER — Ambulatory Visit (HOSPITAL_COMMUNITY)
Admission: RE | Admit: 2020-10-10 | Discharge: 2020-10-10 | Disposition: A | Discharge status: Home / Self Care | Payer: Medicare HMO | Source: Ambulatory Visit | Attending: Family Medicine | Admitting: Family Medicine

## 2020-10-10 ENCOUNTER — Other Ambulatory Visit: Payer: Self-pay

## 2020-10-10 DIAGNOSIS — R110 Nausea: Secondary | ICD-10-CM | POA: Insufficient documentation

## 2020-10-10 DIAGNOSIS — R112 Nausea with vomiting, unspecified: Secondary | ICD-10-CM | POA: Insufficient documentation

## 2021-01-07 ENCOUNTER — Other Ambulatory Visit: Payer: Self-pay | Admitting: Family Medicine

## 2021-02-06 ENCOUNTER — Other Ambulatory Visit: Payer: Self-pay | Admitting: Family Medicine

## 2021-08-27 ENCOUNTER — Telehealth: Payer: Self-pay | Admitting: Cardiology

## 2021-08-27 MED ORDER — CARVEDILOL 12.5 MG PO TABS: 12.5000 mg | ORAL_TABLET | Freq: Two times a day (BID) | ORAL | 0 refills | Status: DC

## 2021-08-27 NOTE — Telephone Encounter (Signed)
?*  STAT* If patient is at the pharmacy, call can be transferred to refill team. ? ? ?1. Which medications need to be refilled? (please list name of each medication and dose if known) Carvedilol 12.5 mg ? ?2. Which pharmacy/location (including street and city if local pharmacy) is medication to be sent to?[facility]  ? ?3. Do they need a 30 day or 90 day supply? Patient is out of his medication . ? ?Needs appointment but can't go to [facility] will try and establish him with [place] doctor.   ?

## 2021-08-31 ENCOUNTER — Telehealth: Payer: Self-pay | Admitting: Licensed Clinical Social Worker

## 2021-08-31 NOTE — Telephone Encounter (Signed)
CSW contacted patient to follow up on transportation request. Patient states he does not need transportation to his appointment that he just needs directions to the [place] office as he has never been there before. CSW printed out directions from patient's home to [place] office from [employer] and mailed to patient's home. CSW available as needed. Lasandra Beech, LCSW, CCSW-MCS [phone] ? ?

## 2021-09-15 ENCOUNTER — Other Ambulatory Visit: Payer: Self-pay | Admitting: Cardiology

## 2021-10-29 IMAGING — US US ABDOMEN COMPLETE
1 series · 14 of 25 positions shown · non-contrast
Comparison: None.

CLINICAL DATA: Nausea and vomiting.

EXAM:
ABDOMEN ULTRASOUND COMPLETE

[Series 1: us abdomen complete · 14 of 151 slices shown]
[im 1/151]
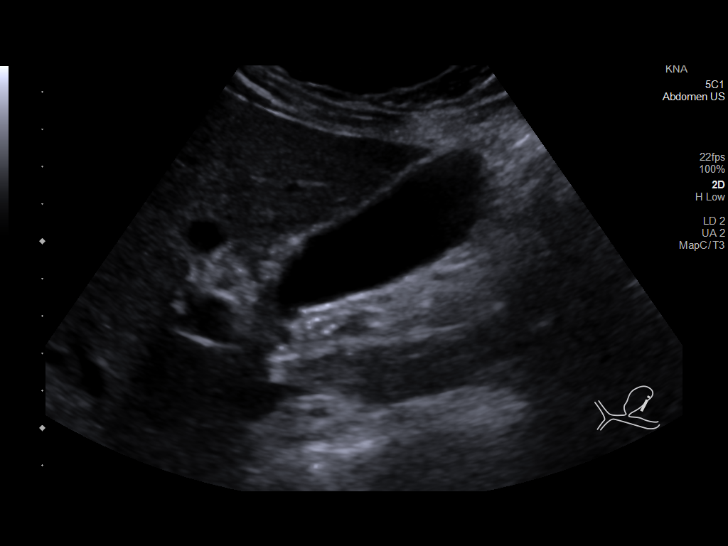
[im 13/151]
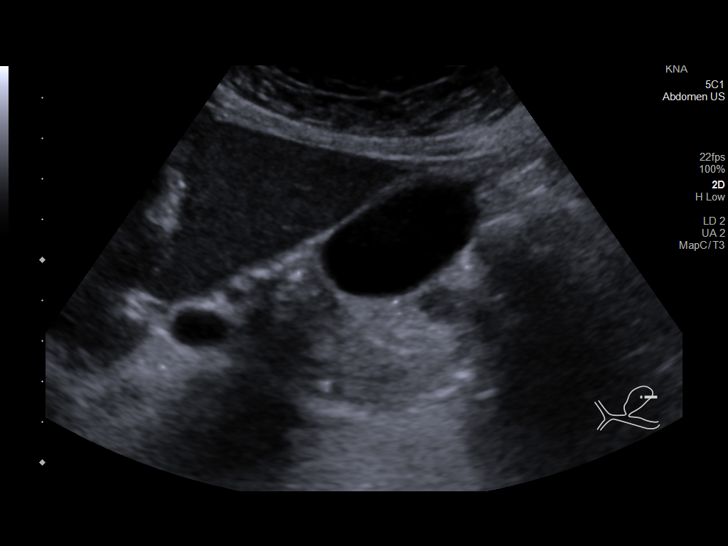
[im 26/151]
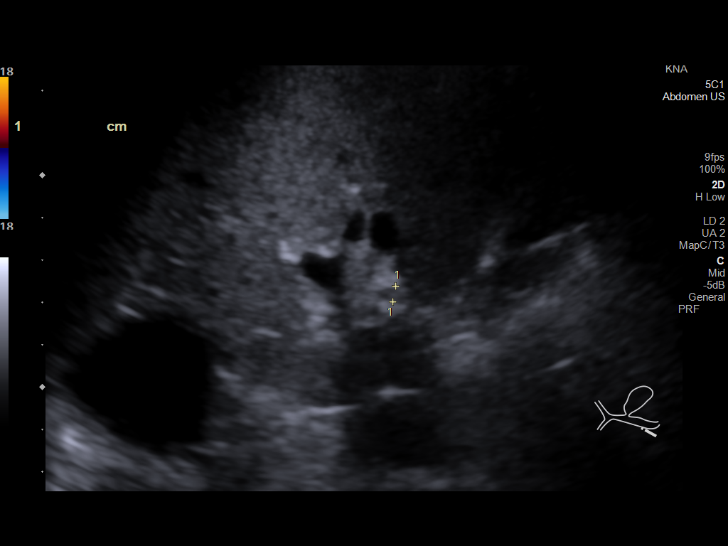
[im 38/151]
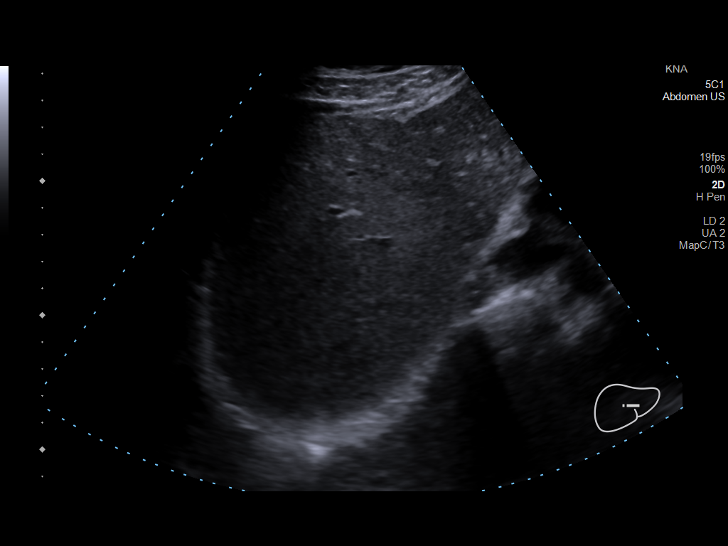
[im 51/151]
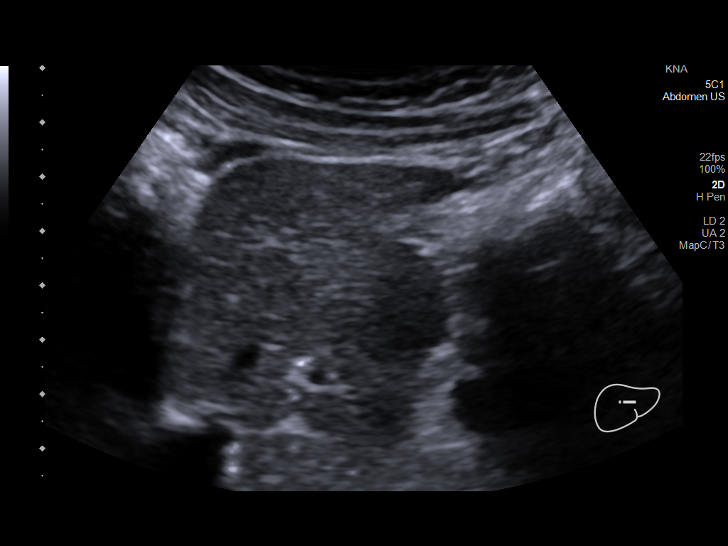
[im 57/151]
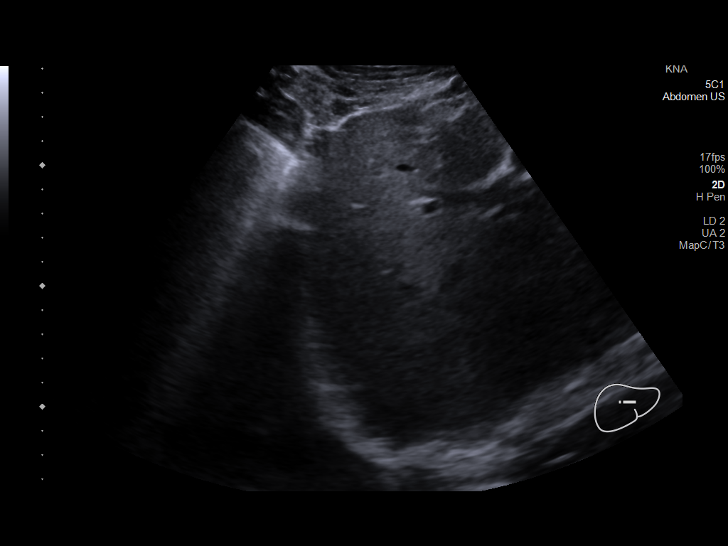
[im 69/151]
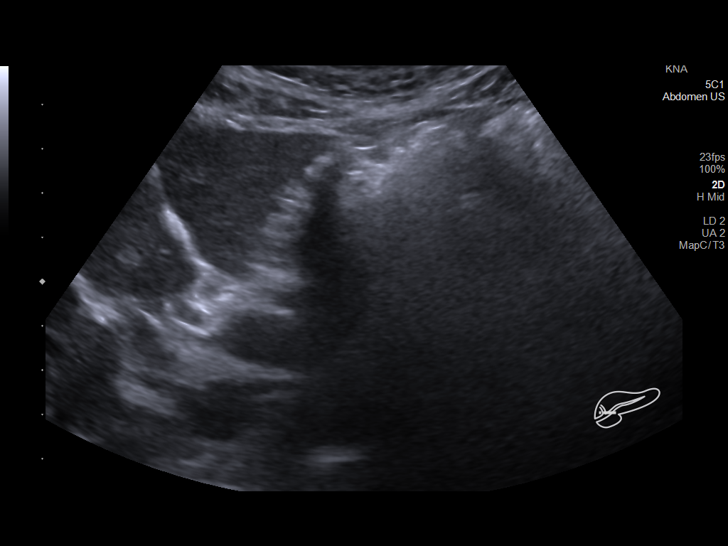
[im 82/151]
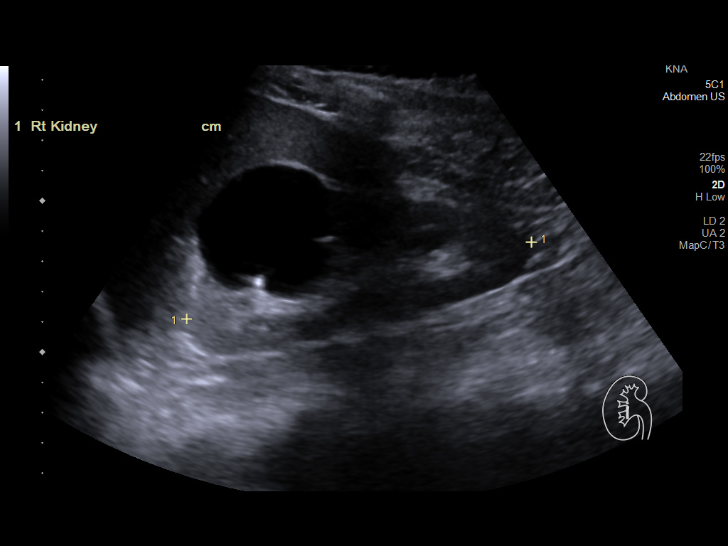
[im 94/151]
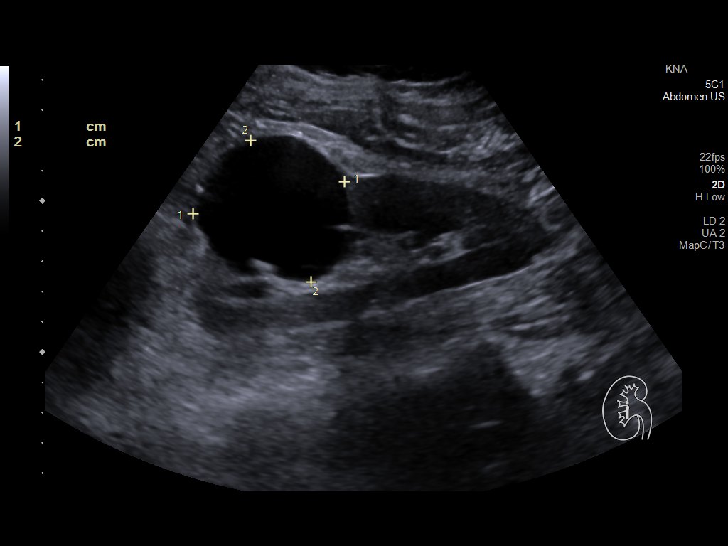
[im 101/151]
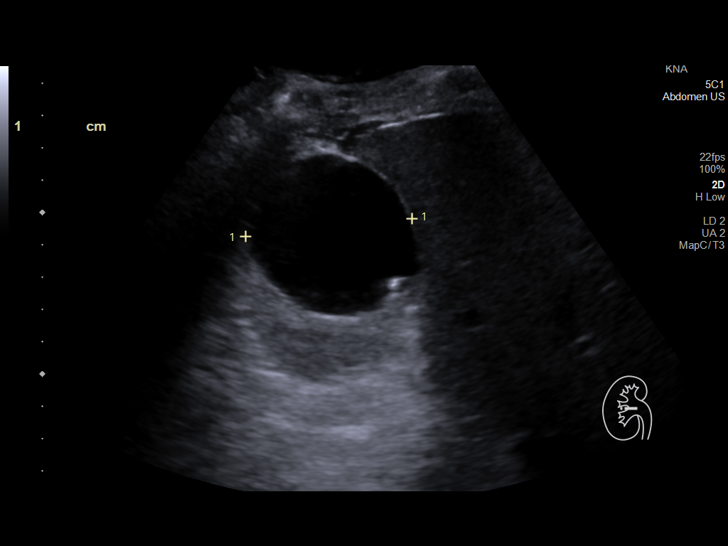
[im 113/151]
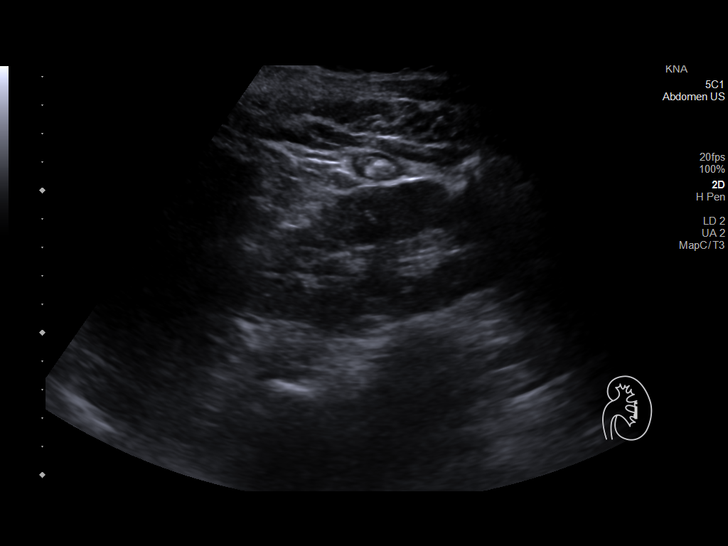
[im 126/151]
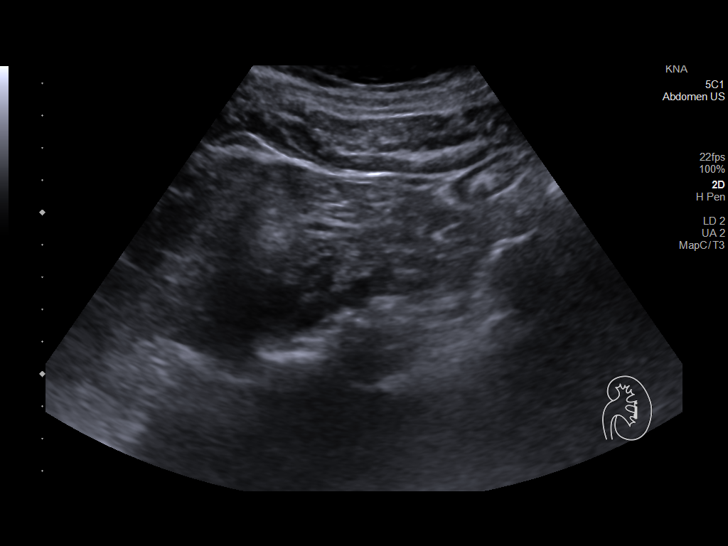
[im 138/151]
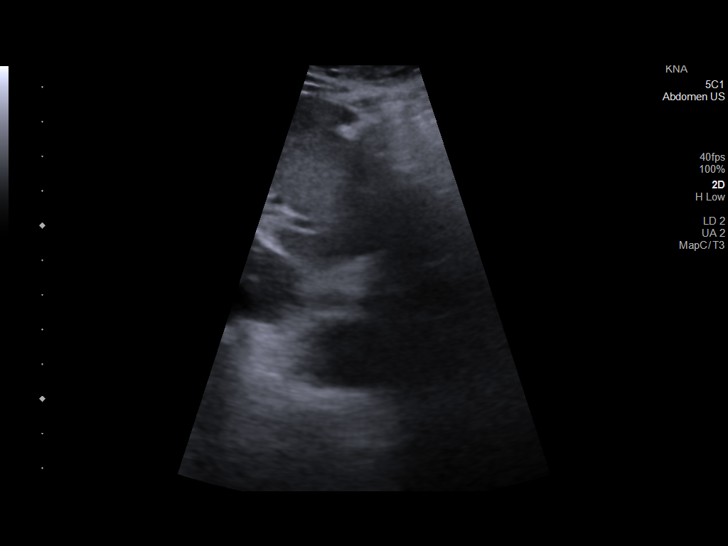
[im 151/151]
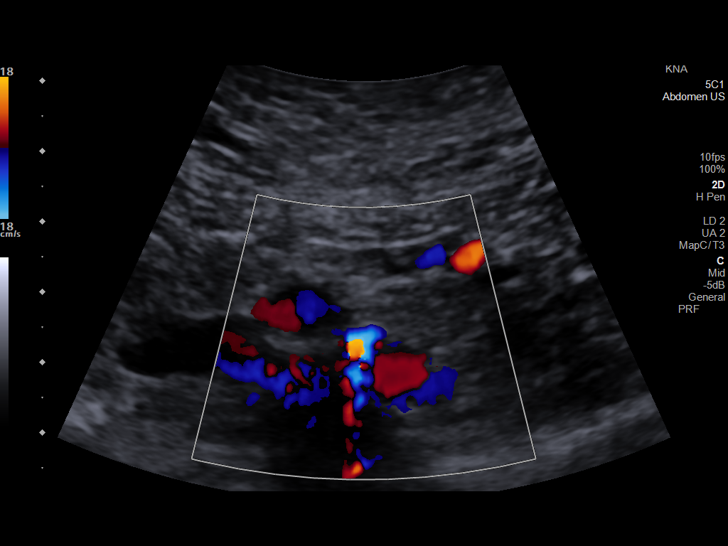

[14 of 25 positions shown; findings below may reference images not displayed]

FINDINGS: Gallbladder: No gallstones or wall thickening visualized. No
sonographic Murphy sign noted by sonographer.

Common bile duct: Diameter: 3.3 mm

Liver: Mild increased echogenicity. No focal mass. Portal vein is
patent on color Doppler imaging with normal direction of blood flow
towards the liver.

IVC: No abnormality visualized.

Pancreas: Not visualized due to shadowing bowel gas.

Spleen: Size and appearance within normal limits.

Right Kidney: Length: 11.7 cm. Contains a mildly complicated cyst
with 1 or 2 thin septations measuring 5.1 x 5.1 x 5.2 cm.

Left Kidney: Length: 10.8 cm. Contains a 3 cm cyst which is simple
in appearance.

Abdominal aorta: No aneurysm visualized.

Other findings: None.
IMPRESSION: 1. Probable mild hepatic steatosis.
2. The pancreas was not visualized.
3. 5.2 cm cyst off the upper pole of the right kidney with 1 or 2
thin septations.
4. 3 cm left renal cyst.
5. No other abnormalities.

## 2021-11-02 NOTE — Progress Notes (Unsigned)
Cardiology Office Note   Date:  11/04/2021   ID:  Russell [name], DOB September 21, 1955, MRN [id number]  PCP:  Gareth Morgan, MD  Cardiologist:   Rollene Rotunda, MD   Chief Complaint  Patient presents with   AVR      History of Present Illness: Russell [name] is a 66 y.o. male who presents for evaluation of chronic systolic heart failure.  His EF had been 35% but was 65% on echo in 2021.  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  He had stable surgical replacement/repair in April 2021.   Since he was last seen he has had no new complaints.  His blood pressure is elevated but he says he is under a lot of stress.  His wife has dementia and he is caring for her.  The patient denies any new symptoms such as chest discomfort, neck or arm discomfort. There has been no new shortness of breath, PND or orthopnea. There have been no reported palpitations, presyncope or syncope.  He is unfortunately still smoking cigarettes.   Past Medical History:  Diagnosis Date   Anxiety    Arthritis    Back pain    Cervical disc disease    C6-7   Chronic kidney disease    kidney cyst   COPD (chronic obstructive pulmonary disease) (HCC)    Essential hypertension    Lung nodules    Shortness of breath dyspnea    Sleep apnea    per wife, no sleep study    Past Surgical History:  Procedure Laterality Date   AORTIC VALVE REPLACEMENT N/A 06/17/2014   Procedure: AORTIC VALVE REPLACEMENT (AVR);  Surgeon: Alleen Borne, MD;  Location: [facility] OR;  Service: Open Heart Surgery;  Laterality: N/A;   BACK SURGERY     CARDIAC CATHETERIZATION  05/23/14   COLONOSCOPY  10/19/2011   Procedure: COLONOSCOPY;  Surgeon: Dalia Heading, MD;  [facility];  Service: Gastroenterology;  Laterality: N/A;   LEFT AND RIGHT HEART CATHETERIZATION WITH CORONARY ANGIOGRAM N/A 05/23/2014   Procedure: LEFT AND RIGHT HEART CATHETERIZATION WITH CORONARY ANGIOGRAM;  Surgeon: Corky Crafts, MD;  Location: [facility] CATH LAB;  Service: Cardiovascular;  Laterality: N/A;   MITRAL VALVE REPAIR N/A 06/17/2014   Procedure: MITRAL VALVE REPAIR (MVR);  Surgeon: Alleen Borne, MD;  Location: [facility] OR;  Service: Open Heart Surgery;  Laterality: N/A;   Right knee surgery     Cartilage removed   TEE WITHOUT CARDIOVERSION N/A 06/17/2014   Procedure: TRANSESOPHAGEAL ECHOCARDIOGRAM (TEE);  Surgeon: Alleen Borne, MD;  Location: [facility] OR;  Service: Open Heart Surgery;  Laterality: N/A;     Current Outpatient Medications  Medication Sig Dispense Refill   albuterol (PROVENTIL HFA;VENTOLIN HFA) 108 (90 BASE) MCG/ACT inhaler Inhale 1-2 puffs into the lungs every 6 (six) hours as needed for wheezing or shortness of breath.     ALPRAZolam (XANAX) 1 MG tablet Take 0.5-1 mg by mouth 4 (four) times daily as needed for anxiety.      aspirin EC 81 MG tablet Take 1 tablet (81 mg total) by mouth daily.     carvedilol (COREG) 12.5 MG tablet Take 1 tablet (12.5 mg total) by mouth 2 (two) times daily. 180 tablet 0   fenofibrate 54 MG tablet Take 54 mg by mouth daily.     ferrous gluconate (FERGON) 324 MG tablet Take 1 tablet (324 mg total) by mouth 2 (two) times  daily with a meal. 60 tablet 1   HYDROcodone-acetaminophen (NORCO) 10-325 MG per tablet Take 1 tablet by mouth 2 (two) times daily as needed.      KLOR-CON M20 20 MEQ tablet TAKE 1 TABLET BY MOUTH EVERY DAY 90 tablet 1   lisinopril (ZESTRIL) 40 MG tablet Take 1 tablet (40 mg total) by mouth daily. 90 tablet 3   Multiple Vitamin (MULTI-VITAMIN PO) Take 1 tablet by mouth 2 (two) times daily.      ondansetron (ZOFRAN) 4 MG tablet Take 4 mg by mouth as needed.      furosemide (LASIX) 20 MG tablet TAKE 1 TABLET BY MOUTH EVERY DAY (Patient not taking: Reported on 11/04/2021) 30 tablet 1   No current facility-administered medications for this visit.    Allergies:   Asa [aspirin] and Prozac [fluoxetine hcl]    ROS:  Please see the history of present illness.    Otherwise, review of systems are positive for none.   All other systems are reviewed and negative.    PHYSICAL EXAM: VS:  BP (!) 172/100   Pulse 61   Ht 5\' 6"  (1.676 m)   Wt 134 lb 12.8 oz (61.1 kg)   SpO2 98%   BMI 21.76 kg/m  , BMI Body mass index is 21.76 kg/m. GENERAL:  Well appearing NECK:  No jugular venous distention, waveform within normal limits, carotid upstroke brisk and symmetric, no bruits, no thyromegaly LUNGS:  Clear to auscultation bilaterally CHEST:   Well healed surgical scars.    HEART:  PMI not displaced or sustained,S1 and S2 within normal limits, no S3, no S4, no clicks, no rubs, 3 out of 6 apical systolic murmur radiating at the aortic outflow tract and early to mid peaking, no diastolic murmurs ABD:  Flat, positive bowel sounds normal in frequency in pitch, no bruits, no rebound, no guarding, no midline pulsatile mass, no hepatomegaly, no splenomegaly EXT:  2 plus pulses throughout, no edema, no cyanosis no clubbing   EKG:  EKG is  ordered today. The ekg ordered today demonstrates: Sinus rhythm, rate 61, axis within normal limits, intervals within normal limits, inferolateral T wave inversions slightly more pronounced than previous   Recent Labs: No results found for requested labs within last 8760 hours.    Lipid Panel    Component Value Date/Time   CHOL 183 05/16/2014 1033   TRIG 125 05/16/2014 1033   HDL 65 05/16/2014 1033   CHOLHDL 2.8 05/16/2014 1033   VLDL 25 05/16/2014 1033   LDLCALC 93 05/16/2014 1033      Wt Readings from Last 3 Encounters:  11/04/21 134 lb 12.8 oz (61.1 kg)  08/29/20 147 lb (66.7 kg)  04/07/20 146 lb 3.2 oz (66.3 kg)      Other studies Reviewed: Additional studies/ records that were reviewed today include: None Review of the above records demonstrates:  NA   ASSESSMENT AND PLAN:  S/P AVR This was stable on echo in April 2021.  Exam has not changed.  He has no symptoms.  I will follow this clinically.    Cardiomyopathy, unspecified type (HCC)/chronic systolic heart failure His ejection fraction was 65 - 70%.  He seems to be euvolemic.  No change in therapy.   Essential hypertension His blood pressure is elevated.  I am going to increase his lisinopril to 40 mg daily.    Smoking Unfortunately he still smoking cigarettes and we talked about this again today.     Current medicines are reviewed  at length with the patient today.  The patient does not have concerns regarding medicines.  The following changes have been made: As above  Labs/ tests ordered today include:    Orders Placed This Encounter  Procedures   Basic metabolic panel   EKG 12-Lead     Disposition:   FU with me 1 year   Signed, Rollene Rotunda, MD  11/04/2021 1:49 PM    [facility]

## 2021-11-04 ENCOUNTER — Encounter: Payer: Self-pay | Admitting: Cardiology

## 2021-11-04 ENCOUNTER — Ambulatory Visit (INDEPENDENT_AMBULATORY_CARE_PROVIDER_SITE_OTHER): Payer: Medicare HMO | Admitting: Cardiology

## 2021-11-04 ENCOUNTER — Other Ambulatory Visit: Payer: Self-pay | Admitting: *Deleted

## 2021-11-04 VITALS — BP 172/100 | HR 61 | Ht 66.0 in | Wt 134.8 lb

## 2021-11-04 DIAGNOSIS — Z79899 Other long term (current) drug therapy: Secondary | ICD-10-CM

## 2021-11-04 DIAGNOSIS — I429 Cardiomyopathy, unspecified: Secondary | ICD-10-CM

## 2021-11-04 DIAGNOSIS — Z720 Tobacco use: Secondary | ICD-10-CM

## 2021-11-04 DIAGNOSIS — I10 Essential (primary) hypertension: Secondary | ICD-10-CM

## 2021-11-04 MED ORDER — LISINOPRIL 40 MG PO TABS: 40.0000 mg | ORAL_TABLET | Freq: Every day | ORAL | 3 refills | Status: DC

## 2021-11-04 NOTE — Patient Instructions (Signed)
Medication Instructions:  Please increase Lisinopril to 40 mg a day. Continue all other medications as listed.  *If you need a refill on your cardiac medications before your next appointment, please call your pharmacy*  Lab Work: Please have blood work at [facility] 11/13/2021.  If you have labs (blood work) drawn today and your tests are completely normal, you will receive your results only by: MyChart Message (if you have MyChart) OR A paper copy in the mail If you have any lab test that is abnormal or we need to change your treatment, we will call you to review the results.   Follow-Up: At [facility], you and your health needs are our priority.  As part of our continuing mission to provide you with exceptional heart care, we have created designated Provider Care Teams.  These Care Teams include your primary Cardiologist (physician) and Advanced Practice Providers (APPs -  Physician Assistants and Nurse Practitioners) who all work together to provide you with the care you need, when you need it.  We recommend signing up for the patient portal called "MyChart".  Sign up information is provided on this After Visit Summary.  MyChart is used to connect with patients for Virtual Visits (Telemedicine).  Patients are able to view lab/test results, encounter notes, upcoming appointments, etc.  Non-urgent messages can be sent to your provider as well.   To learn more about what you can do with MyChart, go to [email or web address].    Your next appointment:   1 year(s)  The format for your next appointment:   In Person  Provider:   Rollene Rotunda, MD{   Important Information About Sugar

## 2021-11-23 ENCOUNTER — Other Ambulatory Visit: Payer: Self-pay | Admitting: Cardiology

## 2021-11-25 ENCOUNTER — Other Ambulatory Visit: Payer: Self-pay | Admitting: Family Medicine

## 2021-11-25 ENCOUNTER — Other Ambulatory Visit (HOSPITAL_COMMUNITY): Payer: Self-pay | Admitting: Family Medicine

## 2021-11-25 DIAGNOSIS — F17210 Nicotine dependence, cigarettes, uncomplicated: Secondary | ICD-10-CM

## 2022-01-01 ENCOUNTER — Ambulatory Visit (HOSPITAL_COMMUNITY)
Admission: RE | Admit: 2022-01-01 | Discharge: 2022-01-01 | Disposition: A | Discharge status: Home / Self Care | Payer: Medicare HMO | Source: Ambulatory Visit | Attending: Family Medicine | Admitting: Family Medicine

## 2022-01-01 DIAGNOSIS — F17210 Nicotine dependence, cigarettes, uncomplicated: Secondary | ICD-10-CM | POA: Insufficient documentation

## 2022-01-11 ENCOUNTER — Other Ambulatory Visit: Payer: Self-pay | Admitting: Cardiology

## 2022-01-23 ENCOUNTER — Other Ambulatory Visit: Payer: Self-pay | Admitting: Cardiology

## 2022-02-22 ENCOUNTER — Other Ambulatory Visit: Payer: Self-pay | Admitting: Cardiology

## 2022-05-25 ENCOUNTER — Other Ambulatory Visit: Payer: Self-pay | Admitting: Cardiology

## 2022-10-12 DIAGNOSIS — Z952 Presence of prosthetic heart valve: Secondary | ICD-10-CM | POA: Insufficient documentation

## 2022-10-12 DIAGNOSIS — Z9889 Other specified postprocedural states: Secondary | ICD-10-CM | POA: Insufficient documentation

## 2022-10-12 DIAGNOSIS — E782 Mixed hyperlipidemia: Secondary | ICD-10-CM | POA: Insufficient documentation

## 2022-11-30 ENCOUNTER — Other Ambulatory Visit: Payer: Self-pay | Admitting: Cardiology

## 2022-12-29 ENCOUNTER — Encounter: Payer: Self-pay | Admitting: Family Medicine

## 2022-12-29 ENCOUNTER — Ambulatory Visit (INDEPENDENT_AMBULATORY_CARE_PROVIDER_SITE_OTHER): Payer: Medicare HMO | Admitting: Family Medicine

## 2022-12-29 VITALS — BP 160/88 | HR 58 | Temp 97.9°F | Ht 66.0 in | Wt 138.0 lb

## 2022-12-29 DIAGNOSIS — R911 Solitary pulmonary nodule: Secondary | ICD-10-CM | POA: Diagnosis not present

## 2022-12-29 DIAGNOSIS — Z125 Encounter for screening for malignant neoplasm of prostate: Secondary | ICD-10-CM

## 2022-12-29 DIAGNOSIS — I10 Essential (primary) hypertension: Secondary | ICD-10-CM | POA: Diagnosis not present

## 2022-12-29 DIAGNOSIS — Z1159 Encounter for screening for other viral diseases: Secondary | ICD-10-CM

## 2022-12-29 DIAGNOSIS — Z720 Tobacco use: Secondary | ICD-10-CM | POA: Diagnosis not present

## 2022-12-29 DIAGNOSIS — F419 Anxiety disorder, unspecified: Secondary | ICD-10-CM

## 2022-12-29 DIAGNOSIS — F17210 Nicotine dependence, cigarettes, uncomplicated: Secondary | ICD-10-CM | POA: Diagnosis not present

## 2022-12-29 DIAGNOSIS — Z1211 Encounter for screening for malignant neoplasm of colon: Secondary | ICD-10-CM

## 2022-12-29 NOTE — Patient Instructions (Signed)
 It was great to meet you today and I'm excited to have you join the [facility] practice. I hope you had a positive experience today! If you feel so inclined, please feel free to recommend our practice to friends and family. Mila Merry, FNP-C

## 2022-12-29 NOTE — Assessment & Plan Note (Signed)
12 month repeat CT ordered

## 2022-12-29 NOTE — Assessment & Plan Note (Signed)
Uncontrolled today in office. He reports increased anxiety about getting to this appointment and the health of his wife. Reports BP readings at home <140/90. He is followed by cardiology. I instructed him to continue to monitor BP at home and report to office if readings sustain >140/90. Seek medical care for chest pain, palpitations, dyspnea with exertion, recurrent headaches, vision changes, swelling of extremities. Follow up in 1 month to ensure adequate control.

## 2022-12-29 NOTE — Assessment & Plan Note (Signed)
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 He takes Xanax and declines further intervention at this time. I would like to discuss this further with him and encourage him to return to office to discuss treatment options such as SSRI.

## 2022-12-29 NOTE — Assessment & Plan Note (Signed)
 3-5 minute discussion regarding the harms of tobacco use, the benefits of cessation, and methods of cessation. Discussed that there are medication options to help with cessation. Provided printed education on steps to quit smoking. Patient is not ready to try a medication to help.

## 2022-12-29 NOTE — Progress Notes (Signed)
New Patient Office Visit  Subjective    Patient ID: Russell [name], male    DOB: 15-May-1956  Age: 67 y.o. MRN: [id number]  CC:  Chief Complaint  Patient presents with   Establish Care    HPI Russell [name] presents to establish care. Oriented to practice routines and expectations. PMH as below. He does see Cardiology. He has HTN and reports he only took one of his BP meds this AM. He checks his BP daily at home and reports his readings are 130-140s/70s. He does report feeling anxious and worried about his wife, he takes a xanax 2-3 times daily. Would not like to try another medication. Denies SI/HI.  Colon CA screening: due colonoscopy ordered Prostate CA screening: Agrees to PSA testing Tobacco: smoker  (0.5 ppd x 54 yrs) Vaccines:  UTD  HYPERTENSION without Chronic Kidney Disease Hypertension status: exacerbated  Satisfied with current treatment? yes Duration of hypertension: chronic BP monitoring frequency:  daily BP range: 130-140/70s BP medication side effects:  no Medication compliance: good compliance Previous BP meds:amlodipine, carvedilol, and lisinopril Aspirin: yes Recurrent headaches: no Visual changes: no Palpitations: no Dyspnea: no Chest pain: no Lower extremity edema: no Dizzy/lightheaded: no   Outpatient Encounter Medications as of 12/29/2022  Medication Sig   albuterol (PROVENTIL HFA;VENTOLIN HFA) 108 (90 BASE) MCG/ACT inhaler Inhale 1-2 puffs into the lungs every 6 (six) hours as needed for wheezing or shortness of breath.   ALPRAZolam (XANAX) 1 MG tablet Take 0.5-1 mg by mouth 4 (four) times daily as needed for anxiety.    amLODipine (NORVASC) 5 MG tablet Take 5 mg by mouth daily.   aspirin EC 81 MG tablet Take 1 tablet (81 mg total) by mouth daily.   carvedilol (COREG) 12.5 MG tablet TAKE 1 TABLET BY MOUTH TWICE DAILY.   fenofibrate 54 MG tablet Take 54 mg by mouth daily.   ferrous gluconate (FERGON) 324 MG tablet Take 1 tablet (324 mg total)  by mouth 2 (two) times daily with a meal.   furosemide (LASIX) 20 MG tablet Take 1 tablet (20 mg total) by mouth daily.   HYDROcodone-acetaminophen (NORCO) 10-325 MG per tablet Take 1 tablet by mouth 2 (two) times daily as needed.    lisinopril (ZESTRIL) 40 MG tablet Take 1 tablet (40 mg total) by mouth daily.   Multiple Vitamin (MULTI-VITAMIN PO) Take 1 tablet by mouth 2 (two) times daily.    ondansetron (ZOFRAN) 4 MG tablet Take 4 mg by mouth as needed.    potassium chloride SA (KLOR-CON M) 20 MEQ tablet TAKE 1 TABLET BY MOUTH EVERY DAY   No facility-administered encounter medications on file as of 12/29/2022.    Past Medical History:  Diagnosis Date   Anxiety    Arthritis    Back pain    Cervical disc disease    C6-7   Chronic kidney disease    kidney cyst   COPD (chronic obstructive pulmonary disease) (HCC)    Essential hypertension    Lung nodules    Shortness of breath dyspnea    Sleep apnea    per wife, no sleep study    Past Surgical History:  Procedure Laterality Date   AORTIC VALVE REPLACEMENT N/A 06/17/2014   Procedure: AORTIC VALVE REPLACEMENT (AVR);  Surgeon: Alleen Borne, MD;  Location[facility] OR;  Service: Open Heart Surgery;  Laterality: N/A;   BACK SURGERY     CARDIAC CATHETERIZATION  05/23/14   COLONOSCOPY  10/19/2011   Procedure: COLONOSCOPY;  Surgeon: Dalia Heading, MD;  [facility];  Service: Gastroenterology;  Laterality: N/A;   LEFT AND RIGHT HEART CATHETERIZATION WITH CORONARY ANGIOGRAM N/A 05/23/2014   Procedure: LEFT AND RIGHT HEART CATHETERIZATION WITH CORONARY ANGIOGRAM;  Surgeon: Corky Crafts, MD;  Location: [facility] CATH LAB;  Service: Cardiovascular;  Laterality: N/A;   MITRAL VALVE REPAIR N/A 06/17/2014   Procedure: MITRAL VALVE REPAIR (MVR);  Surgeon: Alleen Borne, MD;  Location[facility] OR;  Service: Open Heart Surgery;  Laterality: N/A;   Right knee surgery     Cartilage removed   TEE WITHOUT CARDIOVERSION N/A 06/17/2014   Procedure:  TRANSESOPHAGEAL ECHOCARDIOGRAM (TEE);  Surgeon: Alleen Borne, MD;  Location: [facility] OR;  Service: Open Heart Surgery;  Laterality: N/A;    Family History  Problem Relation Age of Onset   Atrial fibrillation Mother    Heart disease Father    Heart disease Maternal Grandfather     Social History   Socioeconomic History   Marital status: Married    Spouse name: Not on file   Number of children: Not on file   Years of education: Not on file   Highest education level: Not on file  Occupational History   Not on file  Tobacco Use   Smoking status: Every Day    Current packs/day: 0.25    Average packs/day: 0.3 packs/day for 54.5 years (13.6 ttl pk-yrs)    Types: Cigarettes    Start date: 06/22/1968   Smokeless tobacco: Never   Tobacco comments:    patient restarted smoking  Vaping Use   Vaping status: Never Used  Substance and Sexual Activity   Alcohol use: No    Alcohol/week: 0.0 standard drinks of alcohol   Drug use: No   Sexual activity: Yes  Other Topics Concern   Not on file  Social History Narrative   Not on file   Social Determinants of Health   Financial Resource Strain: Not on file  Food Insecurity: Not on file  Transportation Needs: Not on file  Physical Activity: Not on file  Stress: Not on file  Social Connections: Not on file  Intimate Partner Violence: Not on file    Review of Systems  All other systems reviewed and are negative.       Objective    BP (!) 160/88   Pulse (!) 58   Temp 97.9 F (36.6 C) (Oral)   Ht 5\' 6"  (1.676 m)   Wt 138 lb (62.6 kg)   SpO2 95%   BMI 22.27 kg/m   Physical Exam Vitals and nursing note reviewed.  Constitutional:      Appearance: Normal appearance. He is normal weight.  HENT:     Head: Normocephalic and atraumatic.  Eyes:     Extraocular Movements:     Right eye: Normal extraocular motion and no nystagmus.     Left eye: Normal extraocular motion and no nystagmus.  Cardiovascular:     Rate and Rhythm:  Normal rate and regular rhythm.     Pulses: Normal pulses.     Heart sounds: Murmur heard.  Pulmonary:     Effort: Pulmonary effort is normal.     Breath sounds: Normal breath sounds.  Genitourinary:    Comments: Deferred using shared decision making Musculoskeletal:        General: Normal range of motion.  Skin:    General: Skin is warm and dry.     Capillary Refill: Capillary refill takes less than 2  seconds.  Neurological:     General: No focal deficit present.     Mental Status: He is alert. Mental status is at baseline.  Psychiatric:        Mood and Affect: Mood normal.        Speech: Speech normal.        Behavior: Behavior normal.        Thought Content: Thought content normal.        Cognition and Memory: Cognition and memory normal.        Judgment: Judgment normal.         Assessment & Plan:   Problem List Items Addressed This Visit     Hypertension - Primary    Uncontrolled today in office. He reports increased anxiety about getting to this appointment and the health of his wife. Reports BP readings at home <140/90. He is followed by cardiology. I instructed him to continue to monitor BP at home and report to office if readings sustain >140/90. Seek medical care for chest pain, palpitations, dyspnea with exertion, recurrent headaches, vision changes, swelling of extremities. Follow up in 1 month to ensure adequate control.      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 He takes Xanax and declines further intervention at this time. I would like to discuss this further with him and encourage him to return to office to discuss treatment options such as SSRI.      Tobacco abuse    3-5 minute discussion regarding the harms of tobacco use, the benefits of cessation, and methods of cessation. Discussed that there are  medication options to help with cessation. Provided printed education on steps to quit smoking. Patient is not ready to try a medication to help.       Relevant Orders   CT CHEST LUNG CANCER SCREENING LOW DOSE WO CONTRAST   Lung nodule    12 month repeat CT ordered      Relevant Orders   CT CHEST LUNG CANCER SCREENING LOW DOSE WO CONTRAST   Other Visit Diagnoses     Need for hepatitis C screening test       Relevant Orders   Hepatitis C antibody   Prostate cancer screening       Relevant Orders   PSA   Colon cancer screening       Relevant Orders   Ambulatory referral to Gastroenterology       Return in about 4 weeks (around 01/26/2023) for anxiety/depression, hypertension.   Park Meo, FNP

## 2023-01-03 ENCOUNTER — Encounter (INDEPENDENT_AMBULATORY_CARE_PROVIDER_SITE_OTHER): Payer: Self-pay | Admitting: *Deleted

## 2023-01-05 ENCOUNTER — Other Ambulatory Visit: Payer: Medicare HMO

## 2023-01-05 LAB — CBC WITH DIFFERENTIAL/PLATELET
Absolute Monocytes: 905 cells/uL (ref 200–950)
Basophils Absolute: 104 cells/uL (ref 0–200)
Eosinophils Relative: 2.6 %
HCT: 43 % (ref 38.5–50.0)
Hemoglobin: 14.1 g/dL (ref 13.2–17.1)
Lymphs Abs: 2355 cells/uL (ref 850–3900)
MCH: 30.3 pg (ref 27.0–33.0)
MPV: 11 fL (ref 7.5–12.5)
Monocytes Relative: 7.8 %
Neutro Abs: 7934 cells/uL — ABNORMAL HIGH (ref 1500–7800)
RDW: 12 % (ref 11.0–15.0)
WBC: 11.6 10*3/uL — ABNORMAL HIGH (ref 3.8–10.8)

## 2023-01-06 LAB — LIPID PANEL
Cholesterol: 163 mg/dL (ref <200)
HDL: 59 mg/dL (ref >=40)
LDL Cholesterol (Calc): 82 mg/dL (calc)
Non-HDL Cholesterol (Calc): 104 mg/dL (calc) (ref <130)
Total CHOL/HDL Ratio: 2.8 (calc) (ref <5.0)
Triglycerides: 121 mg/dL (ref <150)

## 2023-01-06 LAB — CBC WITH DIFFERENTIAL/PLATELET
Basophils Relative: 0.9 %
Eosinophils Absolute: 302 cells/uL (ref 15–500)
MCHC: 32.8 g/dL (ref 32.0–36.0)
MCV: 92.5 fL (ref 80.0–100.0)
Neutrophils Relative %: 68.4 %
Platelets: 265 10*3/uL (ref 140–400)
RBC: 4.65 10*6/uL (ref 4.20–5.80)
Total Lymphocyte: 20.3 %

## 2023-01-06 LAB — COMPLETE METABOLIC PANEL WITH GFR
ALT: 13 U/L (ref 9–46)
Albumin: 4.7 g/dL (ref 3.6–5.1)
Alkaline phosphatase (APISO): 64 U/L (ref 35–144)
BUN: 15 mg/dL (ref 7–25)
CO2: 28 mmol/L (ref 20–32)
Calcium: 11.1 mg/dL — ABNORMAL HIGH (ref 8.6–10.3)
Chloride: 105 mmol/L (ref 98–110)
Creat: 1.05 mg/dL (ref 0.70–1.35)
Glucose, Bld: 103 mg/dL — ABNORMAL HIGH (ref 65–99)
Potassium: 4.9 mmol/L (ref 3.5–5.3)
Sodium: 141 mmol/L (ref 135–146)
Total Bilirubin: 0.4 mg/dL (ref 0.2–1.2)
eGFR: 77 mL/min/{1.73_m2} (ref >=60)

## 2023-01-06 LAB — HEPATITIS C ANTIBODY: Hepatitis C Ab: NONREACTIVE

## 2023-01-11 ENCOUNTER — Other Ambulatory Visit: Payer: Self-pay | Admitting: Cardiology

## 2023-01-27 ENCOUNTER — Ambulatory Visit: Payer: Medicare HMO

## 2023-01-27 ENCOUNTER — Telehealth: Payer: Self-pay

## 2023-01-27 NOTE — Telephone Encounter (Signed)
Spoke with patient in regards to AWV visit and his wife currently being in the hospital.  During conversation he brought up that he is unable to tolerate pain with only 2 Hydrocodone a day.  Says that he was previously prescribed 4 daily by Dr. Sudie Bailey and is asking for this rx back.  Please advise.  Plans to call back to reschedule AWVs.

## 2023-01-31 NOTE — Telephone Encounter (Signed)
Attempted to call pt via mobile/home #'s. No answer and vm-cell phone is full and can't leave message. Will try again.

## 2023-03-08 ENCOUNTER — Other Ambulatory Visit: Payer: Self-pay

## 2023-03-14 ENCOUNTER — Ambulatory Visit: Payer: Medicare HMO | Admitting: Family Medicine

## 2023-03-14 ENCOUNTER — Telehealth: Payer: Self-pay | Admitting: Family Medicine

## 2023-03-14 ENCOUNTER — Other Ambulatory Visit: Payer: Self-pay

## 2023-03-14 DIAGNOSIS — R0609 Other forms of dyspnea: Secondary | ICD-10-CM

## 2023-03-14 DIAGNOSIS — I10 Essential (primary) hypertension: Secondary | ICD-10-CM

## 2023-03-14 MED ORDER — FUROSEMIDE 20 MG PO TABS: 20.0000 mg | ORAL_TABLET | Freq: Every day | ORAL | 1 refills | Status: DC

## 2023-03-14 NOTE — Telephone Encounter (Signed)
Prescription Request  03/14/2023  LOV: 12/29/2022  What is the name of the medication or equipment?   HYDROcodone-acetaminophen (NORCO) 10-325 MG per tablet   furosemide (LASIX) 20 MG tablet [[id number]] a **Out of pills**  Have you contacted your pharmacy to request a refill? Yes   Which pharmacy would you like this sent to?  [facility] - [place], [place] - [address] [place] [address] Phone: [phone] Fax: [phone]    Patient notified that their request is being sent to the clinical staff for review and that they should receive a response within 2 business days.   Please advise patient's wife Bonita Quin at [phone].

## 2023-03-15 ENCOUNTER — Ambulatory Visit: Payer: Medicare HMO | Admitting: Family Medicine

## 2023-03-18 ENCOUNTER — Other Ambulatory Visit: Payer: Self-pay | Admitting: Family Medicine

## 2023-03-18 DIAGNOSIS — Z1211 Encounter for screening for malignant neoplasm of colon: Secondary | ICD-10-CM

## 2023-03-18 DIAGNOSIS — Z1212 Encounter for screening for malignant neoplasm of rectum: Secondary | ICD-10-CM

## 2023-03-21 ENCOUNTER — Encounter: Payer: Self-pay | Admitting: Family Medicine

## 2023-03-21 ENCOUNTER — Ambulatory Visit (INDEPENDENT_AMBULATORY_CARE_PROVIDER_SITE_OTHER): Payer: Medicare HMO | Admitting: Family Medicine

## 2023-03-21 VITALS — BP 130/90 | HR 59 | Temp 98.0°F | Ht 66.0 in | Wt 138.0 lb

## 2023-03-21 DIAGNOSIS — M545 Low back pain, unspecified: Secondary | ICD-10-CM

## 2023-03-21 DIAGNOSIS — G8929 Other chronic pain: Secondary | ICD-10-CM | POA: Diagnosis not present

## 2023-03-21 MED ORDER — HYDROCODONE-ACETAMINOPHEN 10-325 MG PO TABS: 1.0000 | ORAL_TABLET | Freq: Four times a day (QID) | ORAL | 0 refills | Status: DC | PRN

## 2023-03-21 NOTE — Assessment & Plan Note (Signed)
Chronic. Discussed risks of long term opoid use, especially in addition to benzodiazepams. Will continue Norco as previously prescribed for time being and refer to pain clinic as well as [facility]. Discussed need to wean Xanax, he will reduce to no more than 2 tablets daily and continue to wean as tolerated. UDS and controlled substance agreement signed today. PDMP reviewed.

## 2023-03-21 NOTE — Progress Notes (Signed)
Subjective:  HPI: Russell [name] is a 67 y.o. male presenting on 03/21/2023 for No chief complaint on file.   HPI Patient is in today for chronic pain management in his neck, right elbow and back. He has had lower back surgery disc repair since the accident, has completed physical therapy. His pain has been well controlled chronically taking Hydrocodone-Acetaminophen 10-325mg  4 times daily PRN. He is also taking 2-3 xanax daily as well. He denies worsening or changing symptoms, no saddle numbness or urinary or fecal incontinence.   Review of Systems  All other systems reviewed and are negative.   Relevant past medical history reviewed and updated as indicated.   Past Medical History:  Diagnosis Date   Anxiety    Arthritis    Back pain    Cervical disc disease    C6-7   Chronic kidney disease    kidney cyst   COPD (chronic obstructive pulmonary disease) (HCC)    Essential hypertension    Lung nodules    Shortness of breath dyspnea    Sleep apnea    per wife, no sleep study     Past Surgical History:  Procedure Laterality Date   AORTIC VALVE REPLACEMENT N/A 06/17/2014   Procedure: AORTIC VALVE REPLACEMENT (AVR);  Surgeon: Alleen Borne, MD;  Location: [facility] OR;  Service: Open Heart Surgery;  Laterality: N/A;   BACK SURGERY     CARDIAC CATHETERIZATION  05/23/14   COLONOSCOPY  10/19/2011   Procedure: COLONOSCOPY;  Surgeon: Dalia Heading, MD;  [facility];  Service: Gastroenterology;  Laterality: N/A;   LEFT AND RIGHT HEART CATHETERIZATION WITH CORONARY ANGIOGRAM N/A 05/23/2014   Procedure: LEFT AND RIGHT HEART CATHETERIZATION WITH CORONARY ANGIOGRAM;  Surgeon: Corky Crafts, MD;  Location: [facility] CATH LAB;  Service: Cardiovascular;  Laterality: N/A;   MITRAL VALVE REPAIR N/A 06/17/2014   Procedure: MITRAL VALVE REPAIR (MVR);  Surgeon: Alleen Borne, MD;  Location: [facility] OR;  Service: Open Heart Surgery;  Laterality: N/A;   Right knee surgery     Cartilage removed    TEE WITHOUT CARDIOVERSION N/A 06/17/2014   Procedure: TRANSESOPHAGEAL ECHOCARDIOGRAM (TEE);  Surgeon: Alleen Borne, MD;  Location: [facility] OR;  Service: Open Heart Surgery;  Laterality: N/A;    Allergies and medications reviewed and updated.   Current Outpatient Medications:    albuterol (PROVENTIL HFA;VENTOLIN HFA) 108 (90 BASE) MCG/ACT inhaler, Inhale 1-2 puffs into the lungs every 6 (six) hours as needed for wheezing or shortness of breath., Disp: , Rfl:    ALPRAZolam (XANAX) 1 MG tablet, Take 0.5-1 mg by mouth 4 (four) times daily as needed for anxiety. , Disp: , Rfl:    amLODipine (NORVASC) 5 MG tablet, Take 5 mg by mouth daily., Disp: , Rfl:    aspirin EC 81 MG tablet, Take 1 tablet (81 mg total) by mouth daily., Disp: , Rfl:    carvedilol (COREG) 12.5 MG tablet, TAKE 1 TABLET BY MOUTH TWICE DAILY., Disp: 180 tablet, Rfl: 3   fenofibrate 54 MG tablet, Take 54 mg by mouth daily., Disp: , Rfl:    ferrous gluconate (FERGON) 324 MG tablet, Take 1 tablet (324 mg total) by mouth 2 (two) times daily with a meal., Disp: 60 tablet, Rfl: 1   furosemide (LASIX) 20 MG tablet, Take 1 tablet (20 mg total) by mouth daily., Disp: 90 tablet, Rfl: 1   lisinopril (ZESTRIL) 40 MG tablet, Take 1 tablet (40 mg total) by mouth daily., Disp:  90 tablet, Rfl: 3   Multiple Vitamin (MULTI-VITAMIN PO), Take 1 tablet by mouth 2 (two) times daily. , Disp: , Rfl:    ondansetron (ZOFRAN) 4 MG tablet, Take 4 mg by mouth as needed. , Disp: , Rfl:    potassium chloride SA (KLOR-CON M) 20 MEQ tablet, TAKE 1 TABLET BY MOUTH EVERY DAY, Disp: 30 tablet, Rfl: 0   HYDROcodone-acetaminophen (NORCO) 10-325 MG tablet, Take 1 tablet by mouth every 6 (six) hours as needed for severe pain., Disp: 120 tablet, Rfl: 0  Allergies  Allergen Reactions   Asa [Aspirin] Diarrhea and Nausea And Vomiting   Prozac [Fluoxetine Hcl] Other (See Comments)    confusion    Objective:   BP (!) 130/90   Pulse (!) 59   Temp 98 F (36.7 C) (Oral)    Ht 5\' 6"  (1.676 m)   Wt 138 lb (62.6 kg)   SpO2 98%   BMI 22.27 kg/m      03/21/2023   12:10 PM 12/29/2022    9:05 AM 12/29/2022    8:20 AM  Vitals with BMI  Height 5\' 6"   5\' 6"   Weight 138 lbs  138 lbs  BMI 22.28  22.28  Systolic 130 160 161  Diastolic 90 88 90  Pulse 59  58     Physical Exam Vitals and nursing note reviewed.  Constitutional:      Appearance: Normal appearance. He is normal weight.  HENT:     Head: Normocephalic and atraumatic.  Musculoskeletal:     Cervical back: Normal.     Thoracic back: Normal.     Lumbar back: Normal.  Skin:    General: Skin is warm and dry.     Capillary Refill: Capillary refill takes less than 2 seconds.  Neurological:     General: No focal deficit present.     Mental Status: He is alert and oriented to person, place, and time. Mental status is at baseline.  Psychiatric:        Mood and Affect: Mood normal.        Behavior: Behavior normal.        Thought Content: Thought content normal.        Judgment: Judgment normal.     Assessment & Plan:  Chronic bilateral low back pain without sciatica Assessment & Plan: Chronic. Discussed risks of long term opoid use, especially in addition to benzodiazepams. Will continue Norco as previously prescribed for time being and refer to pain clinic as well as [facility]. Discussed need to wean Xanax, he will reduce to no more than 2 tablets daily and continue to wean as tolerated. UDS and controlled substance agreement signed today. PDMP reviewed.   Orders: -     Ambulatory referral to Pain Clinic -     DRUG MONITOR, PANEL 1, W/CONF, URINE  Other orders -     HYDROcodone-Acetaminophen; Take 1 tablet by mouth every 6 (six) hours as needed for severe pain.  Dispense: 120 tablet; Refill: 0     Follow up plan: Return in about 3 months (around 06/21/2023) for chronic pain.  Park Meo, FNP

## 2023-03-22 ENCOUNTER — Telehealth: Payer: Self-pay

## 2023-03-22 NOTE — Telephone Encounter (Signed)
Fax over referral to [facility] for pt 03/21/23 for back pain per provider

## 2023-03-23 LAB — DRUG MONITOR, PANEL 1, W/CONF, URINE
Alphahydroxyalprazolam: 66 ng/mL — ABNORMAL HIGH (ref <25)
Alphahydroxymidazolam: NEGATIVE ng/mL (ref <50)
Alphahydroxytriazolam: NEGATIVE ng/mL (ref <50)
Aminoclonazepam: NEGATIVE ng/mL (ref <25)
Amphetamines: NEGATIVE ng/mL (ref <500)
Barbiturates: NEGATIVE ng/mL (ref <300)
Benzodiazepines: POSITIVE ng/mL — AB (ref <100)
Cocaine Metabolite: NEGATIVE ng/mL (ref <150)
Codeine: NEGATIVE ng/mL (ref <50)
Creatinine: 18.7 mg/dL — ABNORMAL LOW (ref >=20.0)
Hydrocodone: NEGATIVE ng/mL (ref <50)
Hydromorphone: NEGATIVE ng/mL (ref <50)
Hydroxyethylflurazepam: NEGATIVE ng/mL (ref <50)
Lorazepam: NEGATIVE ng/mL (ref <50)
Marijuana Metabolite: NEGATIVE ng/mL (ref <20)
Methadone Metabolite: NEGATIVE ng/mL (ref <100)
Morphine: NEGATIVE ng/mL (ref <50)
Nordiazepam: NEGATIVE ng/mL (ref <50)
Norhydrocodone: NEGATIVE ng/mL (ref <50)
Noroxycodone: 464 ng/mL — ABNORMAL HIGH (ref <50)
Opiates: NEGATIVE ng/mL (ref <100)
Oxazepam: NEGATIVE ng/mL (ref <50)
Oxidant: NEGATIVE ug/mL (ref <200)
Oxycodone: 226 ng/mL — ABNORMAL HIGH (ref <50)
Oxycodone: POSITIVE ng/mL — AB (ref <100)
Oxymorphone: 835 ng/mL — ABNORMAL HIGH (ref <50)
Phencyclidine: NEGATIVE ng/mL (ref <25)
Specific Gravity: 1.003 (ref >=1.003)
Temazepam: NEGATIVE ng/mL (ref <50)
pH: 6.9 (ref 4.5–9.0)

## 2023-03-23 LAB — DM TEMPLATE

## 2023-03-31 ENCOUNTER — Ambulatory Visit: Payer: Medicare HMO | Admitting: Family Medicine

## 2023-03-31 VITALS — BP 120/68 | HR 54 | Temp 97.6°F | Ht 66.0 in | Wt 142.0 lb

## 2023-03-31 DIAGNOSIS — I10 Essential (primary) hypertension: Secondary | ICD-10-CM

## 2023-03-31 NOTE — Assessment & Plan Note (Signed)
Well controlled. He is followed by cardiology. Continue Amlodipine 5mg  daily and Lisinopril 40mg  daily. Recommend heart healthy diet such as Mediterranean diet with whole grains, fruits, vegetable, fish, lean meats, nuts, and olive oil. Limit salt. Encouraged exercise as tolerated. Avoid tobacco products. Avoid excess alcohol. Take medications as prescribed and bring medications and blood pressure log with cuff to each office visit. Seek medical care for chest pain, palpitations, shortness of breath with exertion, dizziness/lightheadedness, vision changes, recurrent headaches, or swelling of extremities.

## 2023-03-31 NOTE — Progress Notes (Signed)
Subjective:  HPI: Russell [name] is a 67 y.o. male presenting on 03/31/2023 for Follow-up (3 mos)   HPI Patient is in today for chronic condition follow up.  HYPERTENSION without Chronic Kidney Disease Hypertension status: controlled  Satisfied with current treatment? yes Duration of hypertension: chronic BP monitoring frequency:  not checking BP range:  BP medication side effects:  no Medication compliance: excellent compliance Previous BP meds:amlodipine and lisinopril Aspirin: yes Recurrent headaches: no Visual changes: no Palpitations: no Dyspnea: no Chest pain: no Lower extremity edema: no Dizzy/lightheaded: no   Review of Systems  All other systems reviewed and are negative.   Relevant past medical history reviewed and updated as indicated.   Past Medical History:  Diagnosis Date   Anxiety    Arthritis    Back pain    Cervical disc disease    C6-7   Chronic kidney disease    kidney cyst   COPD (chronic obstructive pulmonary disease) (HCC)    Essential hypertension    Lung nodules    Shortness of breath dyspnea    Sleep apnea    per wife, no sleep study     Past Surgical History:  Procedure Laterality Date   AORTIC VALVE REPLACEMENT N/A 06/17/2014   Procedure: AORTIC VALVE REPLACEMENT (AVR);  Surgeon: Alleen Borne, MD;  Location: [facility] OR;  Service: Open Heart Surgery;  Laterality: N/A;   BACK SURGERY     CARDIAC CATHETERIZATION  05/23/14   COLONOSCOPY  10/19/2011   Procedure: COLONOSCOPY;  Surgeon: Dalia Heading, MD;  [facility];  Service: Gastroenterology;  Laterality: N/A;   LEFT AND RIGHT HEART CATHETERIZATION WITH CORONARY ANGIOGRAM N/A 05/23/2014   Procedure: LEFT AND RIGHT HEART CATHETERIZATION WITH CORONARY ANGIOGRAM;  Surgeon: Corky Crafts, MD;  Location: [facility] CATH LAB;  Service: Cardiovascular;  Laterality: N/A;   MITRAL VALVE REPAIR N/A 06/17/2014   Procedure: MITRAL VALVE REPAIR (MVR);  Surgeon: Alleen Borne, MD;   Location: [facility] OR;  Service: Open Heart Surgery;  Laterality: N/A;   Right knee surgery     Cartilage removed   TEE WITHOUT CARDIOVERSION N/A 06/17/2014   Procedure: TRANSESOPHAGEAL ECHOCARDIOGRAM (TEE);  Surgeon: Alleen Borne, MD;  Location: [facility] OR;  Service: Open Heart Surgery;  Laterality: N/A;    Allergies and medications reviewed and updated.   Current Outpatient Medications:    albuterol (PROVENTIL HFA;VENTOLIN HFA) 108 (90 BASE) MCG/ACT inhaler, Inhale 1-2 puffs into the lungs every 6 (six) hours as needed for wheezing or shortness of breath., Disp: , Rfl:    ALPRAZolam (XANAX) 1 MG tablet, Take 0.5-1 mg by mouth 4 (four) times daily as needed for anxiety. , Disp: , Rfl:    amLODipine (NORVASC) 5 MG tablet, Take 5 mg by mouth daily., Disp: , Rfl:    aspirin EC 81 MG tablet, Take 1 tablet (81 mg total) by mouth daily., Disp: , Rfl:    carvedilol (COREG) 12.5 MG tablet, TAKE 1 TABLET BY MOUTH TWICE DAILY., Disp: 180 tablet, Rfl: 3   fenofibrate 54 MG tablet, Take 54 mg by mouth daily., Disp: , Rfl:    ferrous gluconate (FERGON) 324 MG tablet, Take 1 tablet (324 mg total) by mouth 2 (two) times daily with a meal., Disp: 60 tablet, Rfl: 1   furosemide (LASIX) 20 MG tablet, Take 1 tablet (20 mg total) by mouth daily., Disp: 90 tablet, Rfl: 1   HYDROcodone-acetaminophen (NORCO) 10-325 MG tablet, Take 1 tablet by mouth every  6 (six) hours as needed for severe pain., Disp: 120 tablet, Rfl: 0   lisinopril (ZESTRIL) 40 MG tablet, Take 1 tablet (40 mg total) by mouth daily., Disp: 90 tablet, Rfl: 3   Multiple Vitamin (MULTI-VITAMIN PO), Take 1 tablet by mouth 2 (two) times daily. , Disp: , Rfl:    ondansetron (ZOFRAN) 4 MG tablet, Take 4 mg by mouth as needed. , Disp: , Rfl:    potassium chloride SA (KLOR-CON M) 20 MEQ tablet, TAKE 1 TABLET BY MOUTH EVERY DAY, Disp: 30 tablet, Rfl: 0  Allergies  Allergen Reactions   Asa [Aspirin] Diarrhea and Nausea And Vomiting   Prozac [Fluoxetine Hcl] Other  (See Comments)    confusion    Objective:   BP 120/68   Pulse (!) 54   Temp 97.6 F (36.4 C) (Oral)   Ht 5\' 6"  (1.676 m)   Wt 142 lb (64.4 kg)   BMI 22.92 kg/m      03/31/2023   11:38 AM 03/21/2023   12:10 PM 12/29/2022    9:05 AM  Vitals with BMI  Height 5\' 6"  5\' 6"    Weight 142 lbs 138 lbs   BMI 22.93 22.28   Systolic 120 130 409  Diastolic 68 90 88  Pulse 54 59      Physical Exam Vitals and nursing note reviewed.  Constitutional:      Appearance: Normal appearance. He is normal weight.  HENT:     Head: Normocephalic and atraumatic.  Cardiovascular:     Rate and Rhythm: Normal rate and regular rhythm.     Pulses: Normal pulses.     Heart sounds: Murmur heard.  Pulmonary:     Effort: Pulmonary effort is normal.     Breath sounds: Normal breath sounds.  Skin:    General: Skin is warm and dry.     Capillary Refill: Capillary refill takes less than 2 seconds.  Neurological:     General: No focal deficit present.     Mental Status: He is alert and oriented to person, place, and time. Mental status is at baseline.  Psychiatric:        Mood and Affect: Mood normal.        Behavior: Behavior normal.        Thought Content: Thought content normal.        Judgment: Judgment normal.     Assessment & Plan:  Primary hypertension Assessment & Plan: Well controlled. He is followed by cardiology. Continue Amlodipine 5mg  daily and Lisinopril 40mg  daily. Recommend heart healthy diet such as Mediterranean diet with whole grains, fruits, vegetable, fish, lean meats, nuts, and olive oil. Limit salt. Encouraged exercise as tolerated. Avoid tobacco products. Avoid excess alcohol. Take medications as prescribed and bring medications and blood pressure log with cuff to each office visit. Seek medical care for chest pain, palpitations, shortness of breath with exertion, dizziness/lightheadedness, vision changes, recurrent headaches, or swelling of extremities.       Follow up  plan: Return in about 3 months (around 07/01/2023) for follow-up.  Park Meo, FNP

## 2023-04-01 ENCOUNTER — Other Ambulatory Visit: Payer: Self-pay

## 2023-04-01 MED ORDER — POTASSIUM CHLORIDE CRYS ER 20 MEQ PO TBCR: 20.0000 meq | EXTENDED_RELEASE_TABLET | Freq: Every day | ORAL | 0 refills | Status: DC

## 2023-04-11 ENCOUNTER — Encounter: Payer: Self-pay | Admitting: Internal Medicine

## 2023-04-11 ENCOUNTER — Ambulatory Visit (INDEPENDENT_AMBULATORY_CARE_PROVIDER_SITE_OTHER): Payer: Medicare HMO | Admitting: Internal Medicine

## 2023-04-11 VITALS — BP 138/86 | HR 61 | Ht 66.0 in | Wt 141.6 lb

## 2023-04-11 DIAGNOSIS — I10 Essential (primary) hypertension: Secondary | ICD-10-CM | POA: Diagnosis not present

## 2023-04-11 DIAGNOSIS — G894 Chronic pain syndrome: Secondary | ICD-10-CM | POA: Diagnosis not present

## 2023-04-11 DIAGNOSIS — E782 Mixed hyperlipidemia: Secondary | ICD-10-CM

## 2023-04-11 DIAGNOSIS — M51362 Other intervertebral disc degeneration, lumbar region with discogenic back pain and lower extremity pain: Secondary | ICD-10-CM

## 2023-04-11 DIAGNOSIS — G8929 Other chronic pain: Secondary | ICD-10-CM

## 2023-04-11 DIAGNOSIS — F411 Generalized anxiety disorder: Secondary | ICD-10-CM

## 2023-04-11 DIAGNOSIS — J439 Emphysema, unspecified: Secondary | ICD-10-CM

## 2023-04-11 DIAGNOSIS — Z952 Presence of prosthetic heart valve: Secondary | ICD-10-CM

## 2023-04-11 DIAGNOSIS — M4722 Other spondylosis with radiculopathy, cervical region: Secondary | ICD-10-CM | POA: Diagnosis not present

## 2023-04-11 DIAGNOSIS — I5032 Chronic diastolic (congestive) heart failure: Secondary | ICD-10-CM

## 2023-04-11 NOTE — Patient Instructions (Addendum)
Please stop taking Potassium supplement for now.  Please continue to take other medications as prescribed.  Please continue to follow low salt diet and ambulate as tolerated.  You are being referred to [facility] pain clinic.  Please get blood test done before the next visit.

## 2023-04-11 NOTE — Progress Notes (Signed)
New Patient Office Visit  Subjective:  Patient ID: Russell [name], male    DOB: 17-Jan-1956  Age: 67 y.o. MRN: [id number]  CC:  Chief Complaint  Patient presents with   Establish Care    HPI Russell [name] is a 67 y.o. male with past medical history of HTN, aortic stenosis s/p aortic valve replacement, HLD, GAD, chronic pain syndrome and tobacco abuse who presents for establishing care.  He is a former patient of Dr. Sudie Bailey.  HTN: BP is well-controlled. Takes amlodipine 5 mg QD, carvedilol 12.5 mg twice daily and lisinopril 40 mg QD regularly. Patient denies headache, dizziness, chest pain, dyspnea or palpitations. He has seen [facility] cardiology clinic in 05/24, but is unclear of follow-up.  He takes Lasix 20 mg QD for leg swelling.  He also takes potassium supplement, but his potassium level has been higher normal, last in 07/24 - 4.9.  GAD: He has tried SSRIs in the past.  He currently takes Xanax 1 mg 4 times in a day.  Denies anhedonia.  He feels anxious at times due to his wife's medical conditions.  Chronic pain syndrome from cervical spondylosis and lumbar DDD: He has chronic neck and back pain, worse with movement and is associated with numbness and weakness of the LE.  He currently takes Norco 10-325 mg 4 times in a day.  After lengthy discussion, he agrees to see pain clinic for chronic opioid management.  COPD: He uses albuterol as needed for dyspnea and wheezing.  He still smokes about 0.25 pack/day.    Past Medical History:  Diagnosis Date   Anxiety    Arthritis    Back pain    Cervical disc disease    C6-7   Chronic kidney disease    kidney cyst   COPD (chronic obstructive pulmonary disease) (HCC)    Essential hypertension    Lung nodules    Shortness of breath dyspnea    Sleep apnea    per wife, no sleep study    Past Surgical History:  Procedure Laterality Date   AORTIC VALVE REPLACEMENT N/A 06/17/2014   Procedure: AORTIC VALVE REPLACEMENT (AVR);   Surgeon: Alleen Borne, MD;  Location: [facility] OR;  Service: Open Heart Surgery;  Laterality: N/A;   BACK SURGERY     CARDIAC CATHETERIZATION  05/23/14   COLONOSCOPY  10/19/2011   Procedure: COLONOSCOPY;  Surgeon: Dalia Heading, MD;  [facility];  Service: Gastroenterology;  Laterality: N/A;   LEFT AND RIGHT HEART CATHETERIZATION WITH CORONARY ANGIOGRAM N/A 05/23/2014   Procedure: LEFT AND RIGHT HEART CATHETERIZATION WITH CORONARY ANGIOGRAM;  Surgeon: Corky Crafts, MD;  Location: [facility] CATH LAB;  Service: Cardiovascular;  Laterality: N/A;   MITRAL VALVE REPAIR N/A 06/17/2014   Procedure: MITRAL VALVE REPAIR (MVR);  Surgeon: Alleen Borne, MD;  Location: [facility] OR;  Service: Open Heart Surgery;  Laterality: N/A;   Right knee surgery     Cartilage removed   TEE WITHOUT CARDIOVERSION N/A 06/17/2014   Procedure: TRANSESOPHAGEAL ECHOCARDIOGRAM (TEE);  Surgeon: Alleen Borne, MD;  Location: [facility] OR;  Service: Open Heart Surgery;  Laterality: N/A;    Family History  Problem Relation Age of Onset   Atrial fibrillation Mother    Heart disease Father    Heart disease Maternal Grandfather     Social History   Socioeconomic History   Marital status: Married    Spouse name: Not on file   Number of children: Not on file  Years of education: Not on file   Highest education level: Not on file  Occupational History   Not on file  Tobacco Use   Smoking status: Every Day    Current packs/day: 0.25    Average packs/day: 0.3 packs/day for 54.8 years (13.7 ttl pk-yrs)    Types: Cigarettes    Start date: 06/22/1968   Smokeless tobacco: Never   Tobacco comments:    patient restarted smoking  Vaping Use   Vaping status: Never Used  Substance and Sexual Activity   Alcohol use: No    Alcohol/week: 0.0 standard drinks of alcohol   Drug use: No   Sexual activity: Yes  Other Topics Concern   Not on file  Social History Narrative   Not on file   Social Determinants of Health   Financial  Resource Strain: Not on file  Food Insecurity: Not on file  Transportation Needs: Not on file  Physical Activity: Not on file  Stress: Not on file  Social Connections: Not on file  Intimate Partner Violence: Not on file    ROS Review of Systems  Constitutional:  Negative for chills and fever.  HENT:  Negative for congestion and sore throat.   Eyes:  Negative for pain and discharge.  Respiratory:  Negative for cough and shortness of breath.   Cardiovascular:  Negative for chest pain and palpitations.  Gastrointestinal:  Negative for diarrhea, nausea and vomiting.  Endocrine: Negative for polydipsia and polyuria.  Genitourinary:  Negative for dysuria and hematuria.  Musculoskeletal:  Positive for back pain, gait problem and neck pain. Negative for neck stiffness.  Skin:  Negative for rash.  Neurological:  Positive for weakness (B/l LE) and numbness. Negative for dizziness and headaches.  Psychiatric/Behavioral:  Positive for sleep disturbance. Negative for agitation and behavioral problems. The patient is nervous/anxious.     Objective:   Today's Vitals: BP 138/86 (BP Location: Left Arm)   Pulse 61   Ht 5\' 6"  (1.676 m)   Wt 141 lb 9.6 oz (64.2 kg)   SpO2 90%   BMI 22.85 kg/m   Physical Exam Vitals reviewed.  Constitutional:      General: He is not in acute distress.    Appearance: He is not diaphoretic.  HENT:     Head: Normocephalic and atraumatic.     Nose: Nose normal.     Mouth/Throat:     Mouth: Mucous membranes are moist.  Eyes:     General: No scleral icterus.    Extraocular Movements: Extraocular movements intact.  Cardiovascular:     Rate and Rhythm: Normal rate and regular rhythm.     Pulses: Normal pulses.     Heart sounds: No murmur heard. Pulmonary:     Breath sounds: Normal breath sounds. No wheezing or rales.  Musculoskeletal:     Cervical back: Neck supple. Tenderness present.     Lumbar back: Tenderness present. Decreased range of motion.      Right lower leg: No edema.     Left lower leg: No edema.  Skin:    General: Skin is warm.     Findings: No rash.  Neurological:     General: No focal deficit present.     Mental Status: He is alert and oriented to person, place, and time.     Motor: Weakness (B/l LE - 4/5) present.  Psychiatric:        Mood and Affect: Mood normal.        Behavior: Behavior normal.  Assessment & Plan:   Problem List Items Addressed This Visit       Cardiovascular and Mediastinum   Essential hypertension - Primary    BP Readings from Last 1 Encounters:  04/11/23 138/86   Well-controlled with with amlodipine 5 mg QD, carvedilol 12.5 mg BID, lisinopril 40 mg QD and Lasix 20 mg QD Counseled for compliance with the medications Advised DASH diet and moderate exercise/walking, at least 150 mins/week       Relevant Orders   Basic Metabolic Panel (BMET)   Heart failure with improved ejection fraction (HFimpEF) (HCC)    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 -LVEF has recovered on GDMT post AVR/MVR  Euvolemic on examination today -takes Lasix 20 mg QD with potassium supplement Discontinue potassium supplement as his potential level has been higher normal, also takes lisinopril Follow-up with Cardiology        Respiratory   Pulmonary emphysema (HCC)    Well controlled with albuterol as needed Needs low dose CT chest - will discuss in the next visit        Nervous and Auditory   Cervical spondylosis with radiculopathy    Has chronic neck pain Currently takes Norco 10-325 mg q6h PRN - referred to pain clinic      Relevant Orders   Ambulatory referral to Pain Clinic     Musculoskeletal and Integument   DDD (degenerative disc disease), lumbar    Has chronic low back pain with radicular symptoms Currently takes Norco 10-325 mg q6h PRN - referred to pain clinic      Relevant Orders   Ambulatory referral to Pain Clinic     Other   GAD (generalized anxiety disorder)    Has  tried SSRIs in the past, did not get much relief Has been taking Xanax 1 mg q6h PRN - PDMP reviewed, will refill when due for it      Mixed hyperlipidemia    Check lipid profile Takes fenofibrate      History of aortic valve replacement    Due to aortic stenosis Has seen [facility] cardiology clinic in [place], needs routine follow-up      Chronic pain syndrome    From cervical spondylosis and DDD of lumbar spine Takes chronic opioids, also on chronic benzodiazepines -referred to [facility] pain clinic for chronic opioid management      Relevant Orders   Ambulatory referral to Pain Clinic    Outpatient Encounter Medications as of 04/11/2023  Medication Sig   albuterol (PROVENTIL HFA;VENTOLIN HFA) 108 (90 BASE) MCG/ACT inhaler Inhale 1-2 puffs into the lungs every 6 (six) hours as needed for wheezing or shortness of breath.   ALPRAZolam (XANAX) 1 MG tablet Take 0.5-1 mg by mouth 4 (four) times daily as needed for anxiety.    amLODipine (NORVASC) 5 MG tablet Take 5 mg by mouth daily.   aspirin EC 81 MG tablet Take 1 tablet (81 mg total) by mouth daily.   carvedilol (COREG) 12.5 MG tablet TAKE 1 TABLET BY MOUTH TWICE DAILY.   fenofibrate 54 MG tablet Take 54 mg by mouth daily.   ferrous gluconate (FERGON) 324 MG tablet Take 1 tablet (324 mg total) by mouth 2 (two) times daily with a meal.   furosemide (LASIX) 20 MG tablet Take 1 tablet (20 mg total) by mouth daily.   HYDROcodone-acetaminophen (NORCO) 10-325 MG tablet Take 1 tablet by mouth every 6 (six) hours as needed for severe pain.   lisinopril (ZESTRIL) 40 MG tablet  Take 1 tablet (40 mg total) by mouth daily.   Multiple Vitamin (MULTI-VITAMIN PO) Take 1 tablet by mouth 2 (two) times daily.    ondansetron (ZOFRAN) 4 MG tablet Take 4 mg by mouth as needed.    [DISCONTINUED] potassium chloride SA (KLOR-CON M) 20 MEQ tablet Take 1 tablet (20 mEq total) by mouth daily.   No facility-administered encounter medications on file as of  04/11/2023.    Follow-up: Return in about 4 weeks (around 05/09/2023) for HTN.   Anabel Halon, MD

## 2023-04-15 ENCOUNTER — Encounter: Payer: Self-pay | Admitting: Internal Medicine

## 2023-04-15 DIAGNOSIS — G894 Chronic pain syndrome: Secondary | ICD-10-CM | POA: Insufficient documentation

## 2023-04-15 DIAGNOSIS — M4722 Other spondylosis with radiculopathy, cervical region: Secondary | ICD-10-CM | POA: Insufficient documentation

## 2023-04-15 DIAGNOSIS — J439 Emphysema, unspecified: Secondary | ICD-10-CM | POA: Insufficient documentation

## 2023-04-15 DIAGNOSIS — I5032 Chronic diastolic (congestive) heart failure: Secondary | ICD-10-CM | POA: Insufficient documentation

## 2023-04-15 NOTE — Assessment & Plan Note (Signed)
Has tried SSRIs in the past, did not get much relief Has been taking Xanax 1 mg q6h PRN - PDMP reviewed, will refill when due for it

## 2023-04-15 NOTE — Assessment & Plan Note (Addendum)
BP Readings from Last 1 Encounters:  04/11/23 138/86   Well-controlled with with amlodipine 5 mg QD, carvedilol 12.5 mg BID, lisinopril 40 mg QD and Lasix 20 mg QD Counseled for compliance with the medications Advised DASH diet and moderate exercise/walking, at least 150 mins/week

## 2023-04-15 NOTE — Assessment & Plan Note (Signed)
Has chronic low back pain with radicular symptoms Currently takes Norco 10-325 mg q6h PRN - referred to pain clinic

## 2023-04-15 NOTE — Assessment & Plan Note (Addendum)
Well controlled with albuterol as needed Needs low dose CT chest - will discuss in the next visit

## 2023-04-15 NOTE — Assessment & Plan Note (Signed)
Has chronic neck pain Currently takes Norco 10-325 mg q6h PRN - referred to pain clinic

## 2023-04-15 NOTE — Assessment & Plan Note (Signed)
From cervical spondylosis and DDD of lumbar spine Takes chronic opioids, also on chronic benzodiazepines -referred to [facility] pain clinic for chronic opioid management

## 2023-04-15 NOTE — Assessment & Plan Note (Signed)
Due to aortic stenosis Has seen [facility] cardiology clinic in [place], needs routine follow-up

## 2023-04-15 NOTE — Assessment & Plan Note (Addendum)
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 -LVEF has recovered on GDMT post AVR/MVR  Euvolemic on examination today -takes Lasix 20 mg QD with potassium supplement Discontinue potassium supplement as his potential level has been higher normal, also takes lisinopril Follow-up with Cardiology

## 2023-04-15 NOTE — Assessment & Plan Note (Signed)
Check lipid profile Takes fenofibrate

## 2023-04-19 ENCOUNTER — Other Ambulatory Visit: Payer: Self-pay | Admitting: Family Medicine

## 2023-04-20 ENCOUNTER — Other Ambulatory Visit: Payer: Self-pay | Admitting: Family Medicine

## 2023-04-20 ENCOUNTER — Other Ambulatory Visit: Payer: Self-pay | Admitting: Internal Medicine

## 2023-04-20 ENCOUNTER — Telehealth: Payer: Self-pay | Admitting: Family

## 2023-04-20 ENCOUNTER — Telehealth: Payer: Self-pay

## 2023-04-20 DIAGNOSIS — M4722 Other spondylosis with radiculopathy, cervical region: Secondary | ICD-10-CM

## 2023-04-20 DIAGNOSIS — F411 Generalized anxiety disorder: Secondary | ICD-10-CM

## 2023-04-20 DIAGNOSIS — G894 Chronic pain syndrome: Secondary | ICD-10-CM

## 2023-04-20 MED ORDER — ALPRAZOLAM 1 MG PO TABS: 0.5000 mg | ORAL_TABLET | Freq: Four times a day (QID) | ORAL | 0 refills | Status: DC | PRN

## 2023-04-20 MED ORDER — HYDROCODONE-ACETAMINOPHEN 10-325 MG PO TABS: 1.0000 | ORAL_TABLET | Freq: Four times a day (QID) | ORAL | 0 refills | Status: AC | PRN

## 2023-04-20 NOTE — Telephone Encounter (Signed)
Copied from CRM [id number]. Topic: Clinical - Medication Refill >> Apr 20, 2023 10:57 AM Larwance Sachs wrote: Most Recent Primary Care Visit:  Provider: Anabel Halon  Department: [facility]  Visit Type: NEW PATIENT  Date: 04/11/2023  Medication:  ALPRAZolam (XANAX) 1 MG tablet, HYDROcodone-acetaminophen (NORCO) 10-325 MG tablet Has the patient contacted their pharmacy? Yes (Agent: If no, request that the patient contact the pharmacy for the refill. If patient does not wish to contact the pharmacy document the reason why and proceed with request.) (Agent: If yes, when and what did the pharmacy advise?) Patient had no more refills  Has the prescription been filled recently? Yes  Is the patient out of the medication? Yes  Has the patient been seen for an appointment in the last year OR does the patient have an upcoming appointment? Yes  Can we respond through MyChart? No  Agent: Please be advised that Rx refills may take up to 3 business days. We ask that you follow-up with your pharmacy.

## 2023-04-20 NOTE — Telephone Encounter (Signed)
Copied from CRM [id number]. Topic: Clinical - Medication Refill >> Apr 20, 2023 10:57 AM Larwance Sachs wrote: Most Recent Primary Care Visit:  Provider: Anabel Halon  Department: [facility]  Visit Type: NEW PATIENT  Date: 04/11/2023  Medication: ***  Has the patient contacted their pharmacy?  (Agent: If no, request that the patient contact the pharmacy for the refill. If patient does not wish to contact the pharmacy document the reason why and proceed with request.) (Agent: If yes, when and what did the pharmacy advise?)  Has the prescription been filled recently?   Is the patient out of the medication?   Has the patient been seen for an appointment in the last year OR does the patient have an upcoming appointment?   Can we respond through MyChart?   Agent: Please be advised that Rx refills may take up to 3 business days. We ask that you follow-up with your pharmacy.

## 2023-04-20 NOTE — Telephone Encounter (Signed)
Copied from CRM [id number]. Topic: Clinical - Medication Refill >> Apr 20, 2023 10:55 AM Larwance Sachs wrote: Most Recent Primary Care Visit:  Provider: Anabel Halon  Department: [facility]  Visit Type: NEW PATIENT  Date: 04/11/2023  Medication: HYDROcodone-acetaminophen (NORCO) 10-325 MG tablet  ALPRAZolam (XANAX) 1 MG tablet  Has the patient contacted their pharmacy? Yes (Agent: If no, request that the patient contact the pharmacy for the refill. If patient does not wish to contact the pharmacy document the reason why and proceed with request.) (Agent: If yes, when and what did the pharmacy advise?) Patient has no more refills   Has the prescription been filled recently? Yes  Is the patient out of the medication? Yes  Has the patient been seen for an appointment in the last year OR does the patient have an upcoming appointment? Yes  Can we respond through MyChart? No  Agent: Please be advised that Rx refills may take up to 3 business days. We ask that you follow-up with your pharmacy.

## 2023-05-09 ENCOUNTER — Encounter: Payer: Self-pay | Admitting: Internal Medicine

## 2023-05-09 ENCOUNTER — Ambulatory Visit (INDEPENDENT_AMBULATORY_CARE_PROVIDER_SITE_OTHER): Payer: Medicare HMO | Admitting: Internal Medicine

## 2023-05-09 VITALS — BP 138/78 | HR 63 | Ht 66.0 in | Wt 138.6 lb

## 2023-05-09 DIAGNOSIS — I10 Essential (primary) hypertension: Secondary | ICD-10-CM

## 2023-05-09 DIAGNOSIS — G894 Chronic pain syndrome: Secondary | ICD-10-CM | POA: Diagnosis not present

## 2023-05-09 DIAGNOSIS — M4722 Other spondylosis with radiculopathy, cervical region: Secondary | ICD-10-CM

## 2023-05-09 DIAGNOSIS — I5032 Chronic diastolic (congestive) heart failure: Secondary | ICD-10-CM | POA: Diagnosis not present

## 2023-05-09 DIAGNOSIS — E782 Mixed hyperlipidemia: Secondary | ICD-10-CM

## 2023-05-09 MED ORDER — GABAPENTIN 300 MG PO CAPS: 300.0000 mg | ORAL_CAPSULE | Freq: Every day | ORAL | 1 refills | Status: DC

## 2023-05-09 NOTE — Assessment & Plan Note (Addendum)
Has chronic neck pain Currently takes Norco 10-325 mg q6h PRN - referred to pain clinic Added gabapentin 300 mg nightly for persistent neck pain, has radicular pain towards UE and occipital area

## 2023-05-09 NOTE — Assessment & Plan Note (Addendum)
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

## 2023-05-09 NOTE — Patient Instructions (Signed)
Please start taking Gabapentin 300 mg at bedtime.  Please continue to take medications as prescribed.  Please continue to follow low salt diet and ambulate as tolerated.

## 2023-05-09 NOTE — Progress Notes (Signed)
New Patient Office Visit  Subjective:  Patient ID: Russell [name], male    DOB: Mar 09, 1956  Age: 67 y.o. MRN: [id number]  CC:  Chief Complaint  Patient presents with   Hypertension    Follow up    Neck Pain    Neck pain getting worse     HPI Russell [name] is a 67 y.o. male with past medical history of HTN, aortic stenosis s/p aortic valve replacement, HLD, GAD, chronic pain syndrome and tobacco abuse who presents for f/u of his chronic medical conditions.  HTN: BP is well-controlled. Takes amlodipine 5 mg QD, carvedilol 12.5 mg twice daily and lisinopril 40 mg QD regularly. Patient denies headache, dizziness, chest pain, dyspnea or palpitations. He has seen [facility] cardiology clinic in 05/24, but is unclear of follow-up.  He takes Lasix 20 mg QD for leg swelling.  GAD: He has tried SSRIs in the past.  He currently takes Xanax 1 mg 4 times in a day.  Denies anhedonia.  He feels anxious at times due to his wife's medical conditions.  Chronic pain syndrome from cervical spondylosis and lumbar DDD: He has chronic neck and back pain, worse with movement and is associated with numbness and weakness of the LE.  He currently takes Norco 10-325 mg 4 times in a day.  After lengthy discussion, he agrees to see pain clinic for chronic opioid management.  He reports that he takes multiple ibuprofen daily for neck pain additionally without any relief. He reports that he cut trees in the last week and has been feeling stiffness since then.  COPD: He uses albuterol as needed for dyspnea and wheezing.  He still smokes about 0.25 pack/day.    Past Medical History:  Diagnosis Date   Anxiety    Arthritis    Back pain    Cervical disc disease    C6-7   Chronic kidney disease    kidney cyst   COPD (chronic obstructive pulmonary disease) (HCC)    Essential hypertension    Lung nodules    Shortness of breath dyspnea    Sleep apnea    per wife, no sleep study    Past Surgical History:   Procedure Laterality Date   AORTIC VALVE REPLACEMENT N/A 06/17/2014   Procedure: AORTIC VALVE REPLACEMENT (AVR);  Surgeon: Alleen Borne, MD;  Location: [facility] OR;  Service: Open Heart Surgery;  Laterality: N/A;   BACK SURGERY     CARDIAC CATHETERIZATION  05/23/14   COLONOSCOPY  10/19/2011   Procedure: COLONOSCOPY;  Surgeon: Dalia Heading, MD;  Location: [facility] SUITE;  Service: Gastroenterology;  Laterality: N/A;   LEFT AND RIGHT HEART CATHETERIZATION WITH CORONARY ANGIOGRAM N/A 05/23/2014   Procedure: LEFT AND RIGHT HEART CATHETERIZATION WITH CORONARY ANGIOGRAM;  Surgeon: Corky Crafts, MD;  Location: [facility] CATH LAB;  Service: Cardiovascular;  Laterality: N/A;   MITRAL VALVE REPAIR N/A 06/17/2014   Procedure: MITRAL VALVE REPAIR (MVR);  Surgeon: Alleen Borne, MD;  Location: [facility] OR;  Service: Open Heart Surgery;  Laterality: N/A;   Right knee surgery     Cartilage removed   TEE WITHOUT CARDIOVERSION N/A 06/17/2014   Procedure: TRANSESOPHAGEAL ECHOCARDIOGRAM (TEE);  Surgeon: Alleen Borne, MD;  Location: [facility] OR;  Service: Open Heart Surgery;  Laterality: N/A;    Family History  Problem Relation Age of Onset   Atrial fibrillation Mother    Heart disease Father    Heart disease Maternal Grandfather     Social  History   Socioeconomic History   Marital status: Married    Spouse name: Not on file   Number of children: Not on file   Years of education: Not on file   Highest education level: Not on file  Occupational History   Not on file  Tobacco Use   Smoking status: Every Day    Current packs/day: 0.25    Average packs/day: 0.3 packs/day for 54.9 years (13.7 ttl pk-yrs)    Types: Cigarettes    Start date: 06/22/1968   Smokeless tobacco: Never   Tobacco comments:    patient restarted smoking  Vaping Use   Vaping status: Never Used  Substance and Sexual Activity   Alcohol use: No    Alcohol/week: 0.0 standard drinks of alcohol   Drug use: No   Sexual activity: Yes  Other Topics  Concern   Not on file  Social History Narrative   Not on file   Social Determinants of Health   Financial Resource Strain: Not on file  Food Insecurity: Not on file  Transportation Needs: Not on file  Physical Activity: Not on file  Stress: Not on file  Social Connections: Not on file  Intimate Partner Violence: Not on file    ROS Review of Systems  Constitutional:  Negative for chills and fever.  HENT:  Negative for congestion and sore throat.   Eyes:  Negative for pain and discharge.  Respiratory:  Negative for cough and shortness of breath.   Cardiovascular:  Negative for chest pain and palpitations.  Gastrointestinal:  Negative for diarrhea, nausea and vomiting.  Endocrine: Negative for polydipsia and polyuria.  Genitourinary:  Negative for dysuria and hematuria.  Musculoskeletal:  Positive for back pain, gait problem and neck pain. Negative for neck stiffness.  Skin:  Negative for rash.  Neurological:  Positive for weakness (B/l LE) and numbness. Negative for dizziness and headaches.  Psychiatric/Behavioral:  Positive for sleep disturbance. Negative for agitation and behavioral problems. The patient is nervous/anxious.     Objective:   Today's Vitals: BP 138/78 (BP Location: Left Arm, Patient Position: Sitting, Cuff Size: Normal)   Pulse 63   Ht 5\' 6"  (1.676 m)   Wt 138 lb 9.6 oz (62.9 kg)   SpO2 91%   BMI 22.37 kg/m   Physical Exam Vitals reviewed.  Constitutional:      General: He is not in acute distress.    Appearance: He is not diaphoretic.  HENT:     Head: Normocephalic and atraumatic.     Nose: Nose normal.     Mouth/Throat:     Mouth: Mucous membranes are moist.  Eyes:     General: No scleral icterus.    Extraocular Movements: Extraocular movements intact.  Cardiovascular:     Rate and Rhythm: Normal rate and regular rhythm.     Pulses: Normal pulses.     Heart sounds: No murmur heard. Pulmonary:     Breath sounds: Normal breath sounds. No  wheezing or rales.  Musculoskeletal:     Cervical back: Neck supple. Tenderness present. Pain with movement present. Decreased range of motion.     Lumbar back: Tenderness present. Decreased range of motion.     Right lower leg: No edema.     Left lower leg: No edema.  Skin:    General: Skin is warm.     Findings: No rash.  Neurological:     General: No focal deficit present.     Mental Status: He is alert  and oriented to person, place, and time.     Motor: Weakness (B/l LE - 4/5) present.  Psychiatric:        Mood and Affect: Mood normal.        Behavior: Behavior normal.     Assessment & Plan:   Problem List Items Addressed This Visit       Cardiovascular and Mediastinum   Essential hypertension - Primary    BP Readings from Last 1 Encounters:  05/09/23 138/78   Well-controlled with with amlodipine 5 mg QD, carvedilol 12.5 mg BID, lisinopril 40 mg QD and Lasix 20 mg QD Counseled for compliance with the medications Advised DASH diet and moderate exercise/walking, at least 150 mins/week      Heart failure with improved ejection fraction (HFimpEF) (HCC)    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 -LVEF has recovered on GDMT post AVR/MVR  Euvolemic on examination today -takes Lasix 20 mg QD with potassium supplement Discontinue potassium supplement as his potential level has been higher normal, also takes lisinopril Follow-up with Cardiology at [facility]        Nervous and Auditory   Cervical spondylosis with radiculopathy    Has chronic neck pain Currently takes Norco 10-325 mg q6h PRN - referred to pain clinic Added gabapentin 300 mg nightly for persistent neck pain, has radicular pain towards UE and occipital area      Relevant Medications   gabapentin (NEURONTIN) 300 MG capsule     Other   Mixed hyperlipidemia    Reviewed lipid profile Takes fenofibrate      Chronic pain syndrome    From cervical spondylosis and DDD of lumbar spine Takes chronic opioids, also  on chronic benzodiazepines -referred to [facility] pain clinic for chronic opioid management, appointment on 05/23/23      Relevant Medications   gabapentin (NEURONTIN) 300 MG capsule     Outpatient Encounter Medications as of 05/09/2023  Medication Sig   gabapentin (NEURONTIN) 300 MG capsule Take 1 capsule (300 mg total) by mouth at bedtime.   albuterol (PROVENTIL HFA;VENTOLIN HFA) 108 (90 BASE) MCG/ACT inhaler Inhale 1-2 puffs into the lungs every 6 (six) hours as needed for wheezing or shortness of breath.   ALPRAZolam (XANAX) 1 MG tablet Take 0.5-1 tablets (0.5-1 mg total) by mouth 4 (four) times daily as needed for anxiety.   amLODipine (NORVASC) 5 MG tablet Take 5 mg by mouth daily.   aspirin EC 81 MG tablet Take 1 tablet (81 mg total) by mouth daily.   carvedilol (COREG) 12.5 MG tablet TAKE 1 TABLET BY MOUTH TWICE DAILY.   fenofibrate 54 MG tablet Take 54 mg by mouth daily.   ferrous gluconate (FERGON) 324 MG tablet Take 1 tablet (324 mg total) by mouth 2 (two) times daily with a meal.   furosemide (LASIX) 20 MG tablet Take 1 tablet (20 mg total) by mouth daily.   HYDROcodone-acetaminophen (NORCO) 10-325 MG tablet Take 1 tablet by mouth every 6 (six) hours as needed for severe pain (pain score 7-10).   lisinopril (ZESTRIL) 40 MG tablet Take 1 tablet (40 mg total) by mouth daily.   Multiple Vitamin (MULTI-VITAMIN PO) Take 1 tablet by mouth 2 (two) times daily.    ondansetron (ZOFRAN) 4 MG tablet Take 4 mg by mouth as needed.    No facility-administered encounter medications on file as of 05/09/2023.    Follow-up: Return in about 3 months (around 08/09/2023) for GAD and chronic neck pain.   Roxsana Riding K  Allena Katz, MD

## 2023-05-09 NOTE — Assessment & Plan Note (Addendum)
Reviewed lipid profile Takes fenofibrate

## 2023-05-09 NOTE — Assessment & Plan Note (Signed)
BP Readings from Last 1 Encounters:  05/09/23 138/78   Well-controlled with with amlodipine 5 mg QD, carvedilol 12.5 mg BID, lisinopril 40 mg QD and Lasix 20 mg QD Counseled for compliance with the medications Advised DASH diet and moderate exercise/walking, at least 150 mins/week

## 2023-05-09 NOTE — Assessment & Plan Note (Signed)
From cervical spondylosis and DDD of lumbar spine Takes chronic opioids, also on chronic benzodiazepines -referred to [facility] pain clinic for chronic opioid management, appointment on 05/23/23

## 2023-05-18 ENCOUNTER — Ambulatory Visit: Payer: Self-pay | Admitting: Family Medicine

## 2023-05-20 ENCOUNTER — Other Ambulatory Visit: Payer: Self-pay | Admitting: Internal Medicine

## 2023-05-20 ENCOUNTER — Telehealth: Payer: Self-pay

## 2023-05-20 DIAGNOSIS — M4722 Other spondylosis with radiculopathy, cervical region: Secondary | ICD-10-CM

## 2023-05-20 DIAGNOSIS — G894 Chronic pain syndrome: Secondary | ICD-10-CM

## 2023-05-20 LAB — COLOGUARD: COLOGUARD: POSITIVE — AB

## 2023-05-20 NOTE — Telephone Encounter (Signed)
Copied from CRM [id number]. Topic: Clinical - Medication Question >> May 20, 2023 12:30 PM Lovey Newcomer R wrote: Reason for CRM: Pt calling about his HYDROcodone-acetaminophen (NORCO) 10-325 MG tablet. I adv that the rx is pending right now, but he is in pain and completely out of the med. Can this be done right away?

## 2023-05-20 NOTE — Telephone Encounter (Signed)
Refill request sent to dr patel

## 2023-05-23 ENCOUNTER — Ambulatory Visit: Payer: Self-pay | Admitting: Internal Medicine

## 2023-05-23 ENCOUNTER — Telehealth: Payer: Self-pay

## 2023-05-23 NOTE — Telephone Encounter (Signed)
Copied from CRM [id number]. Topic: Clinical - Red Word Triage >> May 23, 2023  8:22 AM Prudencio Pair wrote: Red Word that prompted transfer to Nurse Triage: Patient states he called a few days ago requesting a refill on his pain medication but it hasn't been sent yet by Dr. Allena Katz. Pt states he has been in pain all weekend.  Chief Complaint: pain medication refill Symptoms: pain to neck, back and knees Frequency: states has had pain forever Pertinent Negatives: Patient denies fever, new injury Disposition: [] ED /[] Urgent Care (no appt availability in office) / [] Appointment(In office/virtual)/ []  [place] [facility]/ [x] Home Care/ [] Refused Recommended Disposition /[] [place] [facility]/ []  Follow-up with PCP Additional Notes: Patient called for refill of pain medication.  States Dr. Reported he would call medicine in on Friday.  Reports has been in pain all weekend.  Has been taking Tylenol but it is not helping.  Message sent to clinic.  Instructed to go to er if becomes worse.  Reason for Disposition  Muscle aches are a chronic symptom (recurrent or ongoing AND present > 4 weeks)  Answer Assessment - Initial Assessment Questions 1. ONSET: "When did the muscle aches or body pains start?"      Been going on for years 2. LOCATION: "What part of your body is hurting?" (e.g., entire body, arms, legs)      Neck, back, "everywhere" 3. SEVERITY: "How bad is the pain?" (Scale 1-10; or mild, moderate, severe)   - MILD (1-3): doesn't interfere with normal activities    - MODERATE (4-7): interferes with normal activities or awakens from sleep    - SEVERE (8-10):  excruciating pain, unable to do any normal activities      9 4. CAUSE: "What do you think is causing the pains?"     Back and knee surgery 5. FEVER: "Have you been having fever?"     no 6. OTHER SYMPTOMS: "Do you have any other symptoms?" (e.g., chest pain, weakness, rash, cold or flu symptoms, weight loss)     no  Protocols used:  Muscle Aches and Body Pain-A-AH

## 2023-05-23 NOTE — Telephone Encounter (Signed)
Patient calling requesting a refill on pain medication

## 2023-05-23 NOTE — Telephone Encounter (Signed)
I don't see any opioids on his med list but last note did say he has an appt 12/9 at [facility] for them to take over his pain management.

## 2023-05-24 DIAGNOSIS — M2550 Pain in unspecified joint: Secondary | ICD-10-CM | POA: Diagnosis not present

## 2023-05-24 DIAGNOSIS — M25521 Pain in right elbow: Secondary | ICD-10-CM | POA: Diagnosis not present

## 2023-05-24 DIAGNOSIS — M542 Cervicalgia: Secondary | ICD-10-CM | POA: Diagnosis not present

## 2023-05-24 DIAGNOSIS — G894 Chronic pain syndrome: Secondary | ICD-10-CM | POA: Diagnosis not present

## 2023-05-24 DIAGNOSIS — M25511 Pain in right shoulder: Secondary | ICD-10-CM | POA: Diagnosis not present

## 2023-05-24 DIAGNOSIS — M25561 Pain in right knee: Secondary | ICD-10-CM | POA: Diagnosis not present

## 2023-05-24 DIAGNOSIS — Z79891 Long term (current) use of opiate analgesic: Secondary | ICD-10-CM | POA: Diagnosis not present

## 2023-05-24 DIAGNOSIS — M4326 Fusion of spine, lumbar region: Secondary | ICD-10-CM | POA: Diagnosis not present

## 2023-05-24 DIAGNOSIS — M25512 Pain in left shoulder: Secondary | ICD-10-CM | POA: Diagnosis not present

## 2023-05-26 ENCOUNTER — Other Ambulatory Visit: Payer: Self-pay | Admitting: Cardiology

## 2023-06-07 ENCOUNTER — Ambulatory Visit (INDEPENDENT_AMBULATORY_CARE_PROVIDER_SITE_OTHER): Payer: Medicare HMO | Admitting: Internal Medicine

## 2023-06-07 ENCOUNTER — Encounter: Payer: Self-pay | Admitting: Internal Medicine

## 2023-06-07 VITALS — BP 138/84 | HR 98 | Ht 66.0 in | Wt 143.4 lb

## 2023-06-07 DIAGNOSIS — J439 Emphysema, unspecified: Secondary | ICD-10-CM

## 2023-06-07 DIAGNOSIS — I10 Essential (primary) hypertension: Secondary | ICD-10-CM | POA: Diagnosis not present

## 2023-06-07 DIAGNOSIS — M51362 Other intervertebral disc degeneration, lumbar region with discogenic back pain and lower extremity pain: Secondary | ICD-10-CM | POA: Diagnosis not present

## 2023-06-07 DIAGNOSIS — R195 Other fecal abnormalities: Secondary | ICD-10-CM | POA: Diagnosis not present

## 2023-06-07 DIAGNOSIS — M4722 Other spondylosis with radiculopathy, cervical region: Secondary | ICD-10-CM

## 2023-06-07 DIAGNOSIS — G894 Chronic pain syndrome: Secondary | ICD-10-CM | POA: Diagnosis not present

## 2023-06-07 DIAGNOSIS — F411 Generalized anxiety disorder: Secondary | ICD-10-CM

## 2023-06-07 MED ORDER — ALPRAZOLAM 1 MG PO TABS: 0.5000 mg | ORAL_TABLET | Freq: Three times a day (TID) | ORAL | 0 refills | Status: DC | PRN

## 2023-06-07 MED ORDER — HYDROCODONE-ACETAMINOPHEN 10-325 MG PO TABS: 1.0000 | ORAL_TABLET | Freq: Four times a day (QID) | ORAL | 0 refills | Status: DC | PRN

## 2023-06-07 NOTE — Assessment & Plan Note (Signed)
 Well controlled with albuterol as needed Needs low dose CT chest - will discuss in the next visit

## 2023-06-07 NOTE — Patient Instructions (Signed)
Please start taking Norco 10-325 mg 4 times daily as prescribed.  Please limit use of Xanax to maximum 3 times daily.  Please continue to take medications as prescribed.  Please continue to follow low salt diet and ambulate as tolerated.

## 2023-06-07 NOTE — Progress Notes (Signed)
Established Patient Office Visit  Subjective:  Patient ID: Russell [name], male    DOB: November 23, 1955  Age: 67 y.o. MRN: [id number]  CC:  Chief Complaint  Patient presents with   Referral    Patient was referred to pain clinic, is not happy with choice     HPI Russell [name] is a 67 y.o. male with past medical history of HTN, aortic stenosis s/p aortic valve replacement, HLD, GAD, chronic pain syndrome and tobacco abuse who presents for f/u of his chronic medical conditions.  Chronic pain syndrome from cervical spondylosis and lumbar DDD: He has chronic neck and back pain, worse with movement and is associated with numbness and weakness of the LE.  He was taking Norco 10-325 mg 4 times in a day. He went to see pain clinic for chronic opioid management, but his dose of Norco was decreased to 5-325 mg QID.  He has had worsening of his neck pain since then and has not been able to complete his daily activities.  He also reports having severe fatigue.  He reports that he takes multiple ibuprofen daily for neck pain additionally without any relief.  HTN: BP is well-controlled. Takes amlodipine 5 mg QD, carvedilol 12.5 mg twice daily and lisinopril 40 mg QD regularly. Patient denies headache, dizziness, chest pain, dyspnea or palpitations. He has seen [facility] cardiology clinic in 05/24, but is unclear of follow-up.  He takes Lasix 20 mg QD for leg swelling.   GAD: He has tried SSRIs in the past.  He currently takes Xanax 1 mg 4 times in a day, but agrees to cut down as he prefers to continue his Norco at 10-325 mg dose.  Denies anhedonia.  He feels anxious at times due to his wife's medical conditions.  COPD: He uses albuterol as needed for dyspnea and wheezing.  He still smokes about 0.25 pack/day.  Past Medical History:  Diagnosis Date   Anxiety    Arthritis    Back pain    Cervical disc disease    C6-7   Chronic kidney disease    kidney cyst   COPD (chronic obstructive pulmonary  disease) (HCC)    Essential hypertension    Lung nodules    Shortness of breath dyspnea    Sleep apnea    per wife, no sleep study    Past Surgical History:  Procedure Laterality Date   AORTIC VALVE REPLACEMENT N/A 06/17/2014   Procedure: AORTIC VALVE REPLACEMENT (AVR);  Surgeon: Alleen Borne, MD;  Location: [facility] OR;  Service: Open Heart Surgery;  Laterality: N/A;   BACK SURGERY     CARDIAC CATHETERIZATION  05/23/14   COLONOSCOPY  10/19/2011   Procedure: COLONOSCOPY;  Surgeon: Dalia Heading, MD;  [facility];  Service: Gastroenterology;  Laterality: N/A;   LEFT AND RIGHT HEART CATHETERIZATION WITH CORONARY ANGIOGRAM N/A 05/23/2014   Procedure: LEFT AND RIGHT HEART CATHETERIZATION WITH CORONARY ANGIOGRAM;  Surgeon: Corky Crafts, MD;  Location: [facility] CATH LAB;  Service: Cardiovascular;  Laterality: N/A;   MITRAL VALVE REPAIR N/A 06/17/2014   Procedure: MITRAL VALVE REPAIR (MVR);  Surgeon: Alleen Borne, MD;  Location: [facility] OR;  Service: Open Heart Surgery;  Laterality: N/A;   Right knee surgery     Cartilage removed   TEE WITHOUT CARDIOVERSION N/A 06/17/2014   Procedure: TRANSESOPHAGEAL ECHOCARDIOGRAM (TEE);  Surgeon: Alleen Borne, MD;  Location: [facility] OR;  Service: Open Heart Surgery;  Laterality: N/A;    Family  History  Problem Relation Age of Onset   Atrial fibrillation Mother    Heart disease Father    Heart disease Maternal Grandfather     Social History   Socioeconomic History   Marital status: Married    Spouse name: Not on file   Number of children: Not on file   Years of education: Not on file   Highest education level: Not on file  Occupational History   Not on file  Tobacco Use   Smoking status: Every Day    Current packs/day: 0.25    Average packs/day: 0.3 packs/day for 55.0 years (13.7 ttl pk-yrs)    Types: Cigarettes    Start date: 06/22/1968   Smokeless tobacco: Never   Tobacco comments:    patient restarted smoking  Vaping Use   Vaping status:  Never Used  Substance and Sexual Activity   Alcohol use: No    Alcohol/week: 0.0 standard drinks of alcohol   Drug use: No   Sexual activity: Yes  Other Topics Concern   Not on file  Social History Narrative   Not on file   Social Drivers of Health   Financial Resource Strain: Not on file  Food Insecurity: Not on file  Transportation Needs: Not on file  Physical Activity: Not on file  Stress: Not on file  Social Connections: Not on file  Intimate Partner Violence: Not on file    Outpatient Medications Prior to Visit  Medication Sig Dispense Refill   albuterol (PROVENTIL HFA;VENTOLIN HFA) 108 (90 BASE) MCG/ACT inhaler Inhale 1-2 puffs into the lungs every 6 (six) hours as needed for wheezing or shortness of breath.     amLODipine (NORVASC) 5 MG tablet Take 5 mg by mouth daily.     aspirin EC 81 MG tablet Take 1 tablet (81 mg total) by mouth daily.     carvedilol (COREG) 12.5 MG tablet TAKE 1 TABLET BY MOUTH TWICE DAILY. 60 tablet 0   fenofibrate 54 MG tablet Take 54 mg by mouth daily.     ferrous gluconate (FERGON) 324 MG tablet Take 1 tablet (324 mg total) by mouth 2 (two) times daily with a meal. 60 tablet 1   furosemide (LASIX) 20 MG tablet Take 1 tablet (20 mg total) by mouth daily. 90 tablet 1   gabapentin (NEURONTIN) 300 MG capsule Take 1 capsule (300 mg total) by mouth at bedtime. 90 capsule 1   lisinopril (ZESTRIL) 40 MG tablet Take 1 tablet (40 mg total) by mouth daily. 90 tablet 3   Multiple Vitamin (MULTI-VITAMIN PO) Take 1 tablet by mouth 2 (two) times daily.      ondansetron (ZOFRAN) 4 MG tablet Take 4 mg by mouth as needed.      ALPRAZolam (XANAX) 1 MG tablet Take 0.5-1 tablets (0.5-1 mg total) by mouth 4 (four) times daily as needed for anxiety. 120 tablet 0   No facility-administered medications prior to visit.    Allergies  Allergen Reactions   Asa [Aspirin] Diarrhea and Nausea And Vomiting   Prozac [Fluoxetine Hcl] Other (See Comments)    confusion     ROS Review of Systems  Constitutional:  Negative for chills and fever.  HENT:  Negative for congestion and sore throat.   Eyes:  Negative for pain and discharge.  Respiratory:  Negative for cough and shortness of breath.   Cardiovascular:  Negative for chest pain and palpitations.  Gastrointestinal:  Negative for diarrhea, nausea and vomiting.  Endocrine: Negative for polydipsia and polyuria.  Genitourinary:  Negative for dysuria and hematuria.  Musculoskeletal:  Positive for back pain, gait problem and neck pain. Negative for neck stiffness.  Skin:  Negative for rash.  Neurological:  Positive for weakness (B/l LE) and numbness. Negative for dizziness and headaches.  Psychiatric/Behavioral:  Positive for sleep disturbance. Negative for agitation and behavioral problems. The patient is nervous/anxious.       Objective:    Physical Exam Vitals reviewed.  Constitutional:      General: He is not in acute distress.    Appearance: He is not diaphoretic.  HENT:     Head: Normocephalic and atraumatic.     Nose: Nose normal.     Mouth/Throat:     Mouth: Mucous membranes are moist.  Eyes:     General: No scleral icterus.    Extraocular Movements: Extraocular movements intact.  Cardiovascular:     Rate and Rhythm: Normal rate and regular rhythm.     Pulses: Normal pulses.     Heart sounds: No murmur heard. Pulmonary:     Breath sounds: Normal breath sounds. No wheezing or rales.  Musculoskeletal:     Cervical back: Neck supple. Tenderness present. Pain with movement present. Decreased range of motion.     Lumbar back: Tenderness present. Decreased range of motion.     Right lower leg: No edema.     Left lower leg: No edema.  Skin:    General: Skin is warm.     Findings: No rash.  Neurological:     General: No focal deficit present.     Mental Status: He is alert and oriented to person, place, and time.     Motor: Weakness (B/l LE - 4/5) present.  Psychiatric:        Mood  and Affect: Mood normal.        Behavior: Behavior normal.     BP 138/84 (BP Location: Left Arm)   Pulse 98   Ht 5\' 6"  (1.676 m)   Wt 143 lb 6.4 Russell (65 kg)   SpO2 98%   BMI 23.15 kg/m  Wt Readings from Last 3 Encounters:  06/07/23 143 lb 6.4 Russell (65 kg)  05/09/23 138 lb 9.6 Russell (62.9 kg)  04/11/23 141 lb 9.6 Russell (64.2 kg)    Lab Results  Component Value Date   TSH 0.638 05/16/2014   Lab Results  Component Value Date   WBC 11.6 (H) 01/05/2023   HGB 14.1 01/05/2023   HCT 43.0 01/05/2023   MCV 92.5 01/05/2023   PLT 265 01/05/2023   Lab Results  Component Value Date   NA 141 01/05/2023   K 4.9 01/05/2023   CO2 28 01/05/2023   GLUCOSE 103 (H) 01/05/2023   BUN 15 01/05/2023   CREATININE 1.05 01/05/2023   BILITOT 0.4 01/05/2023   ALKPHOS 85 07/16/2014   AST 19 01/05/2023   ALT 13 01/05/2023   PROT 7.2 01/05/2023   ALBUMIN 4.0 07/16/2014   CALCIUM 11.1 (H) 01/05/2023   ANIONGAP 9 06/22/2014   EGFR 77 01/05/2023   Lab Results  Component Value Date   CHOL 163 01/05/2023   Lab Results  Component Value Date   HDL 59 01/05/2023   Lab Results  Component Value Date   LDLCALC 82 01/05/2023   Lab Results  Component Value Date   TRIG 121 01/05/2023   Lab Results  Component Value Date   CHOLHDL 2.8 01/05/2023   Lab Results  Component Value Date   HGBA1C 6.0 (H)  06/13/2014      Assessment & Plan:   Problem List Items Addressed This Visit       Cardiovascular and Mediastinum   Essential hypertension   BP Readings from Last 1 Encounters:  06/07/23 138/84   Well-controlled with with amlodipine 5 mg QD, carvedilol 12.5 mg BID, lisinopril 40 mg QD and Lasix 20 mg QD Counseled for compliance with the medications Advised DASH diet and moderate exercise/walking, at least 150 mins/week        Respiratory   Pulmonary emphysema (HCC)   Well controlled with albuterol as needed Needs low dose CT chest - will discuss in the next visit        Nervous and  Auditory   Cervical spondylosis with radiculopathy - Primary   Has chronic neck pain Currently takes Norco 10-325 mg q6h PRN - referred to pain clinic, dose was decreased, he had severe worsening of pain and fatigue Started Norco 10-325 mg q6h PRN again, have agreed to manage his chronic pain for now, given he decreases Xanax dosing - he expressed understanding Recently added gabapentin 300 mg nightly for persistent neck pain, has radicular pain towards UE and occipital area      Relevant Medications   HYDROcodone-acetaminophen (NORCO) 10-325 MG tablet (Start on 06/15/2023)   ALPRAZolam (XANAX) 1 MG tablet (Start on 06/17/2023)     Musculoskeletal and Integument   DDD (degenerative disc disease), lumbar   Relevant Medications   HYDROcodone-acetaminophen (NORCO) 10-325 MG tablet (Start on 06/15/2023)     Other   GAD (generalized anxiety disorder)   Has tried SSRIs in the past, did not get much relief Has been taking Xanax 1 mg q6h PRN - PDMP reviewed After lengthy discussion, he agrees to cut down dose of Xanax to 1 mg 3 times daily as needed for now      Relevant Medications   ALPRAZolam (XANAX) 1 MG tablet (Start on 06/17/2023)   Chronic pain syndrome   From cervical spondylosis and DDD of lumbar spine Takes chronic opioids, also on chronic benzodiazepines -referred to [facility] pain clinic for chronic opioid management, but was not pleased with experience I have agreed to manage chronic pain for now - if needed for higher dosing, will refer to [facility]      Relevant Medications   HYDROcodone-acetaminophen (NORCO) 10-325 MG tablet (Start on 06/15/2023)   Positive colorectal cancer screening using Cologuard test   Referred to GI for screening colonoscopy      Relevant Orders   Ambulatory referral to Gastroenterology    Meds ordered this encounter  Medications   HYDROcodone-acetaminophen (NORCO) 10-325 MG tablet    Sig: Take 1 tablet by mouth every 6 (six) hours as needed.     Dispense:  120 tablet    Refill:  0   ALPRAZolam (XANAX) 1 MG tablet    Sig: Take 0.5-1 tablets (0.5-1 mg total) by mouth 3 (three) times daily as needed for anxiety.    Dispense:  90 tablet    Refill:  0    Dose change    Follow-up: Return if symptoms worsen or fail to improve.    Anabel Halon, MD

## 2023-06-07 NOTE — Assessment & Plan Note (Signed)
Has tried SSRIs in the past, did not get much relief Has been taking Xanax 1 mg q6h PRN - PDMP reviewed After lengthy discussion, he agrees to cut down dose of Xanax to 1 mg 3 times daily as needed for now

## 2023-06-07 NOTE — Assessment & Plan Note (Signed)
Has chronic neck pain Currently takes Norco 10-325 mg q6h PRN - referred to pain clinic, dose was decreased, he had severe worsening of pain and fatigue Started Norco 10-325 mg q6h PRN again, have agreed to manage his chronic pain for now, given he decreases Xanax dosing - he expressed understanding Recently added gabapentin 300 mg nightly for persistent neck pain, has radicular pain towards UE and occipital area

## 2023-06-07 NOTE — Assessment & Plan Note (Signed)
BP Readings from Last 1 Encounters:  06/07/23 138/84   Well-controlled with with amlodipine 5 mg QD, carvedilol 12.5 mg BID, lisinopril 40 mg QD and Lasix 20 mg QD Counseled for compliance with the medications Advised DASH diet and moderate exercise/walking, at least 150 mins/week

## 2023-06-07 NOTE — Assessment & Plan Note (Signed)
Referred to GI for screening colonoscopy.  

## 2023-06-07 NOTE — Assessment & Plan Note (Signed)
From cervical spondylosis and DDD of lumbar spine Takes chronic opioids, also on chronic benzodiazepines -referred to [facility] pain clinic for chronic opioid management, but was not pleased with experience I have agreed to manage chronic pain for now - if needed for higher dosing, will refer to [facility]

## 2023-06-13 ENCOUNTER — Encounter (INDEPENDENT_AMBULATORY_CARE_PROVIDER_SITE_OTHER): Payer: Self-pay | Admitting: *Deleted

## 2023-06-16 DIAGNOSIS — F17210 Nicotine dependence, cigarettes, uncomplicated: Secondary | ICD-10-CM | POA: Diagnosis not present

## 2023-06-16 DIAGNOSIS — I10 Essential (primary) hypertension: Secondary | ICD-10-CM | POA: Diagnosis not present

## 2023-06-16 DIAGNOSIS — Z952 Presence of prosthetic heart valve: Secondary | ICD-10-CM | POA: Diagnosis not present

## 2023-06-16 DIAGNOSIS — I5020 Unspecified systolic (congestive) heart failure: Secondary | ICD-10-CM | POA: Diagnosis not present

## 2023-06-16 DIAGNOSIS — E782 Mixed hyperlipidemia: Secondary | ICD-10-CM | POA: Diagnosis not present

## 2023-06-23 ENCOUNTER — Ambulatory Visit (INDEPENDENT_AMBULATORY_CARE_PROVIDER_SITE_OTHER): Payer: Medicare HMO | Admitting: Gastroenterology

## 2023-06-23 ENCOUNTER — Encounter (INDEPENDENT_AMBULATORY_CARE_PROVIDER_SITE_OTHER): Payer: Self-pay | Admitting: Gastroenterology

## 2023-06-23 VITALS — BP 166/98 | HR 69 | Temp 97.8°F | Ht 66.0 in | Wt 141.6 lb

## 2023-06-23 DIAGNOSIS — R195 Other fecal abnormalities: Secondary | ICD-10-CM

## 2023-06-23 DIAGNOSIS — Z1212 Encounter for screening for malignant neoplasm of rectum: Secondary | ICD-10-CM

## 2023-06-23 NOTE — Patient Instructions (Signed)
 Schedule colonoscopy

## 2023-06-23 NOTE — Progress Notes (Signed)
 Toribio Fortune, M.D. Gastroenterology & Hepatology [facility] Gastroenterology [address] [place], [place] [address] Primary Care Physician: [name], MD [address] [place] [address]  Referring MD: PCP  Chief Complaint:  positive Cologuard  History of Present Illness: Russell [name] is a 68 y.o. male with past medical history of COPD, CKD, hypertension, sleep apnea, heart failure with decreased ejection fraction, aortic valve and mitral valve replacement, who presents for evaluation of positive Cologuard.  Patient had a positive Cologuard on 05/11/2023.  Most recent CBC from 01/05/2023 showed a normal hemoglobin of 14.1.  The patient denies having any nausea, vomiting, fever, chills, hematochezia, melena, hematemesis, abdominal distention, abdominal pain, diarrhea, jaundice, pruritus or weight loss.  Last ZHI:wzczm Last Colonoscopy: 10/19/2011, performed by Dr. Mavis Normal colonoscopy  FHx: neg for any gastrointestinal/liver disease, prostate cancer father, grandfather and brothers x2 Social: smokes 1/2 pack daily, neg alcohol or illicit drug use Surgical: no abdominal surgeries  Past Medical History: Past Medical History:  Diagnosis Date   Anxiety    Arthritis    Back pain    Cervical disc disease    C6-7   Chronic kidney disease    kidney cyst   COPD (chronic obstructive pulmonary disease) (HCC)    Essential hypertension    Lung nodules    Shortness of breath dyspnea    Sleep apnea    per wife, no sleep study    Past Surgical History: Past Surgical History:  Procedure Laterality Date   AORTIC VALVE REPLACEMENT N/A 06/17/2014   Procedure: AORTIC VALVE REPLACEMENT (AVR);  Surgeon: Dorise POUR Fellers, MD;  Location: [facility] OR;  Service: Open Heart Surgery;  Laterality: N/A;   BACK SURGERY     CARDIAC CATHETERIZATION  05/23/14   COLONOSCOPY  10/19/2011   Procedure: COLONOSCOPY;  Surgeon: Oneil DELENA Mavis, MD;  [facility];  Service: Gastroenterology;  Laterality: N/A;   LEFT AND RIGHT HEART CATHETERIZATION WITH CORONARY ANGIOGRAM N/A 05/23/2014   Procedure: LEFT AND RIGHT HEART CATHETERIZATION WITH CORONARY ANGIOGRAM;  Surgeon: Candyce GORMAN Reek, MD;  Location: [facility] CATH LAB;  Service: Cardiovascular;  Laterality: N/A;   MITRAL VALVE REPAIR N/A 06/17/2014   Procedure: MITRAL VALVE REPAIR (MVR);  Surgeon: Dorise POUR Fellers, MD;  Location: [facility] OR;  Service: Open Heart Surgery;  Laterality: N/A;   Right knee surgery     Cartilage removed   TEE WITHOUT CARDIOVERSION N/A 06/17/2014   Procedure: TRANSESOPHAGEAL ECHOCARDIOGRAM (TEE);  Surgeon: Dorise POUR Fellers, MD;  Location: [facility] OR;  Service: Open Heart Surgery;  Laterality: N/A;    Family History: Family History  Problem Relation Age of Onset   Atrial fibrillation Mother    Heart disease Father    Heart disease Maternal Grandfather     Social History: Social History   Tobacco Use  Smoking Status Every Day   Current packs/day: 0.25   Average packs/day: 0.3 packs/day for 55.0 years (13.8 ttl pk-yrs)   Types: Cigarettes   Start date: 06/22/1968  Smokeless Tobacco Never  Tobacco Comments   patient restarted smoking   Social History   Substance and Sexual Activity  Alcohol Use No   Alcohol/week: 0.0 standard drinks of alcohol   Social History   Substance and Sexual Activity  Drug Use No    Allergies: Allergies  Allergen Reactions   Dorethia Ochs ] Diarrhea and Nausea And Vomiting   Prozac [Fluoxetine Hcl] Other (See Comments)    confusion  Medications: Current Outpatient Medications  Medication Sig Dispense Refill   albuterol  (PROVENTIL  HFA;VENTOLIN  HFA) 108 (90 BASE) MCG/ACT inhaler Inhale 1-2 puffs into the lungs every 6 (six) hours as needed for wheezing or shortness of breath.     ALPRAZolam  (XANAX ) 1 MG tablet Take 0.5-1 tablets (0.5-1 mg total) by mouth 3 (three) times daily as needed for anxiety. 90 tablet 0   amLODipine  (NORVASC ) 5 MG  tablet Take 5 mg by mouth daily.     aspirin  EC 81 MG tablet Take 1 tablet (81 mg total) by mouth daily.     carvedilol  (COREG ) 12.5 MG tablet TAKE 1 TABLET BY MOUTH TWICE DAILY. 60 tablet 0   fenofibrate 54 MG tablet Take 54 mg by mouth daily.     ferrous gluconate  (FERGON) 324 MG tablet Take 1 tablet (324 mg total) by mouth 2 (two) times daily with a meal. 60 tablet 1   furosemide  (LASIX ) 20 MG tablet Take 1 tablet (20 mg total) by mouth daily. 90 tablet 1   gabapentin  (NEURONTIN ) 300 MG capsule Take 1 capsule (300 mg total) by mouth at bedtime. 90 capsule 1   HYDROcodone -acetaminophen  (NORCO) 10-325 MG tablet Take 1 tablet by mouth every 6 (six) hours as needed. 120 tablet 0   lisinopril  (ZESTRIL ) 40 MG tablet Take 1 tablet (40 mg total) by mouth daily. 90 tablet 3   Multiple Vitamin (MULTI-VITAMIN PO) Take 1 tablet by mouth 2 (two) times daily.      ondansetron  (ZOFRAN ) 4 MG tablet Take 4 mg by mouth as needed.      No current facility-administered medications for this visit.    Review of Systems: GENERAL: negative for malaise, night sweats HEENT: No changes in hearing or vision, no nose bleeds or other nasal problems. NECK: Negative for lumps, goiter, pain and significant neck swelling RESPIRATORY: Negative for cough, wheezing CARDIOVASCULAR: Negative for chest pain, leg swelling, palpitations, orthopnea GI: SEE HPI MUSCULOSKELETAL: Negative for joint pain or swelling, back pain, and muscle pain. SKIN: Negative for lesions, rash PSYCH: Negative for sleep disturbance, mood disorder and recent psychosocial stressors. HEMATOLOGY Negative for prolonged bleeding, bruising easily, and swollen nodes. ENDOCRINE: Negative for cold or heat intolerance, polyuria, polydipsia and goiter. NEURO: negative for tremor, gait imbalance, syncope and seizures. The remainder of the review of systems is noncontributory.   Physical Exam: Temp 97.8 F (36.6 C)   Ht 5' 6 (1.676 m)   Wt 141 lb 9.6 oz  (64.2 kg)   BMI 22.85 kg/m  GENERAL: The patient is AO x3, in no acute distress. HEENT: Head is normocephalic and atraumatic. EOMI are intact. Mouth is well hydrated and without lesions. NECK: Supple. No masses LUNGS: Clear to auscultation. No presence of rhonchi/wheezing/rales. Adequate chest expansion HEART: RRR, normal s1 and s2. ABDOMEN: Soft, nontender, no guarding, no peritoneal signs, and nondistended. BS +. No masses. EXTREMITIES: Without any cyanosis, clubbing, rash, lesions or edema. NEUROLOGIC: AOx3, no focal motor deficit. SKIN: no jaundice, no rashes   Imaging/Labs: as above  I personally reviewed and interpreted the available labs, imaging and endoscopic files.  Impression and Plan: Russell [name] is a 68 y.o. male with past medical history of COPD, CKD, hypertension, sleep apnea, heart failure with decreased ejection fraction, aortic valve and mitral valve replacement, who presents for evaluation of positive Cologuard. Patient does not have any high risk factors for colorectal cancer malignancy and has been asymptomatic. Discussed cologuard test results in detail, specifically what it means when the  test is positive or negative.  Discussed that there is a possibility that even when the test is positive there may not be a polyp found on colonoscopy. More than 50% of the office visit was dedicated to discussing the procedure, including the day of and risks involved. Patient understands what the procedure involves including the benefits and any risks. Patient understands alternatives to the proposed procedure. Risks including (but not limited to) bleeding, tearing of the lining (perforation), rupture of adjacent organs, problems with heart and lung function, infection, and medication reactions. A small percentage of complications may require surgery, hospitalization, repeat endoscopic procedure, and/or transfusion. A small percentage of polyps and other tumors may not be seen.  -  Schedule colonoscopy  All questions were answered.      Toribio Fortune, MD Gastroenterology and Hepatology [facility] Gastroenterology

## 2023-06-24 MED ORDER — PEG 3350-KCL-NA BICARB-NACL 420 G PO SOLR: 4000.0000 mL | Freq: Once | ORAL | 0 refills | Status: AC

## 2023-06-24 NOTE — Addendum Note (Signed)
 Addended by: Marlowe Shores on: 06/24/2023 09:33 AM   Modules accepted: Orders

## 2023-07-13 ENCOUNTER — Telehealth: Payer: Self-pay

## 2023-07-13 ENCOUNTER — Other Ambulatory Visit: Payer: Self-pay | Admitting: Internal Medicine

## 2023-07-13 DIAGNOSIS — M4722 Other spondylosis with radiculopathy, cervical region: Secondary | ICD-10-CM

## 2023-07-13 DIAGNOSIS — G894 Chronic pain syndrome: Secondary | ICD-10-CM

## 2023-07-13 DIAGNOSIS — M51362 Other intervertebral disc degeneration, lumbar region with discogenic back pain and lower extremity pain: Secondary | ICD-10-CM

## 2023-07-13 NOTE — Telephone Encounter (Signed)
Contact made for [facility]CS program, pt declined services.

## 2023-07-14 NOTE — Patient Instructions (Signed)
Russell [name]  07/14/2023     @PREFPERIOPPHARMACY @   Your procedure is scheduled on Tuesday, 2/4.  Report to [facility] at 1045 A.M.  Call this number if you have problems the morning of surgery:  [phone]  If you experience any cold or flu symptoms such as cough, fever, chills, shortness of breath, etc. between now and your scheduled surgery, please notify us at the above number.   Remember:  Do not eat or drink after midnight.  You may drink clear liquids until 0845 .  Clear liquids allowed are:                    Water, Juice (No red color; non-citric and without pulp; diabetics please choose diet or no sugar options), Carbonated beverages (diabetics please choose diet or no sugar options), Clear Tea (No creamer, milk, or cream, including half & half and powdered creamer), Black Coffee Only (No creamer, milk or cream, including half & half and powdered creamer), Plain Jell-O Only (No red color; diabetics please choose no sugar options), and Clear Sports drink (No red color; diabetics please choose diet or no sugar options)    Take these medicines the morning of surgery with A SIP OF WATER albuterol, xanax, amlodipine, carvedilol, gabapentin, norco and zofran if needed     Do not wear jewelry, make-up or nail polish, including gel polish,  artificial nails, or any other type of covering on natural nails (fingers and  toes).  Do not wear lotions, powders, or perfumes, or deodorant.  Do not shave 48 hours prior to surgery.  Men may shave face and neck.  Do not bring valuables to the hospital.  [facility] is not responsible for any belongings or valuables.  Contacts, dentures or bridgework may not be worn into surgery.  Leave your suitcase in the car.  After surgery it may be brought to your room.  For patients admitted to the hospital, discharge time will be determined by your treatment team.  Patients discharged the day of surgery will not be allowed to drive home.    Name and phone number of your driver:   family Special instructions:  Please follow diet and prep instructions given to you at Dr. Wilburt Finlay office.  Please read over the following fact sheets that you were given. Anesthesia Post-op Instructions      Colonoscopy, Adult, Care After The following information offers guidance on how to care for yourself after your procedure. Your health care provider may also give you more specific instructions. If you have problems or questions, contact your health care provider. What can I expect after the procedure? After the procedure, it is common to have: A small amount of blood in your stool for 24 hours after the procedure. Some gas. Mild cramping or bloating of your abdomen. Follow these instructions at home: Eating and drinking  Drink enough fluid to keep your urine pale yellow. Follow instructions from your health care provider about eating or drinking restrictions. Resume your normal diet as told by your health care provider. Avoid heavy or fried foods that are hard to digest. Activity Rest as told by your health care provider. Avoid sitting for a long time without moving. Get up to take short walks every 1-2 hours. This is important to improve blood flow and breathing. Ask for help if you feel weak or unsteady. Return to your normal activities as told by your health care provider. Ask your health care provider what  activities are safe for you. Managing cramping and bloating  Try walking around when you have cramps or feel bloated. If directed, apply heat to your abdomen as told by your health care provider. Use the heat source that your health care provider recommends, such as a moist heat pack or a heating pad. Place a towel between your skin and the heat source. Leave the heat on for 20-30 minutes. Remove the heat if your skin turns bright red. This is especially important if you are unable to feel pain, heat, or cold. You have a  greater risk of getting burned. General instructions If you were given a sedative during the procedure, it can affect you for several hours. Do not drive or operate machinery until your health care provider says that it is safe. For the first 24 hours after the procedure: Do not sign important documents. Do not drink alcohol. Do your regular daily activities at a slower pace than normal. Eat soft foods that are easy to digest. Take over-the-counter and prescription medicines only as told by your health care provider. Keep all follow-up visits. This is important. Contact a health care provider if: You have blood in your stool 2-3 days after the procedure. Get help right away if: You have more than a small spotting of blood in your stool. You have large blood clots in your stool. You have swelling of your abdomen. You have nausea or vomiting. You have a fever. You have increasing pain in your abdomen that is not relieved with medicine. These symptoms may be an emergency. Get help right away. Call 911. Do not wait to see if the symptoms will go away. Do not drive yourself to the hospital. Summary After the procedure, it is common to have a small amount of blood in your stool. You may also have mild cramping and bloating of your abdomen. If you were given a sedative during the procedure, it can affect you for several hours. Do not drive or operate machinery until your health care provider says that it is safe. Get help right away if you have a lot of blood in your stool, nausea or vomiting, a fever, or increased pain in your abdomen. This information is not intended to replace advice given to you by your health care provider. Make sure you discuss any questions you have with your health care provider. Document Revised: 07/13/2022 Document Reviewed: 01/21/2021 Elsevier Patient Education  2024 [employer].

## 2023-07-15 ENCOUNTER — Encounter (HOSPITAL_COMMUNITY)
Admission: RE | Admit: 2023-07-15 | Discharge: 2023-07-15 | Disposition: A | Discharge status: Home / Self Care | Payer: Medicare HMO | Source: Ambulatory Visit | Attending: Gastroenterology | Admitting: Gastroenterology

## 2023-07-15 DIAGNOSIS — I10 Essential (primary) hypertension: Secondary | ICD-10-CM

## 2023-07-15 DIAGNOSIS — Z79899 Other long term (current) drug therapy: Secondary | ICD-10-CM

## 2023-07-18 ENCOUNTER — Encounter (HOSPITAL_COMMUNITY)
Admission: RE | Admit: 2023-07-18 | Discharge: 2023-07-18 | Disposition: A | Discharge status: Home / Self Care | Payer: Medicare HMO | Source: Ambulatory Visit | Attending: Gastroenterology | Admitting: Gastroenterology

## 2023-07-18 ENCOUNTER — Encounter (HOSPITAL_COMMUNITY): Payer: Self-pay

## 2023-07-18 ENCOUNTER — Telehealth (INDEPENDENT_AMBULATORY_CARE_PROVIDER_SITE_OTHER): Payer: Self-pay | Admitting: Gastroenterology

## 2023-07-18 ENCOUNTER — Other Ambulatory Visit: Payer: Self-pay

## 2023-07-18 DIAGNOSIS — Z01818 Encounter for other preprocedural examination: Secondary | ICD-10-CM | POA: Diagnosis not present

## 2023-07-18 DIAGNOSIS — I10 Essential (primary) hypertension: Secondary | ICD-10-CM | POA: Insufficient documentation

## 2023-07-18 DIAGNOSIS — Z79899 Other long term (current) drug therapy: Secondary | ICD-10-CM | POA: Insufficient documentation

## 2023-07-18 LAB — BASIC METABOLIC PANEL
Anion gap: 10 (ref 5–15)
BUN: 9 mg/dL (ref 8–23)
CO2: 26 mmol/L (ref 22–32)
Calcium: 10.6 mg/dL — ABNORMAL HIGH (ref 8.9–10.3)
Chloride: 104 mmol/L (ref 98–111)
Creatinine, Ser: 0.93 mg/dL (ref 0.61–1.24)
GFR, Estimated: >60 mL/min (ref >60)
Glucose, Bld: 93 mg/dL (ref 70–99)
Potassium: 4 mmol/L (ref 3.5–5.1)
Sodium: 140 mmol/L (ref 135–145)

## 2023-07-18 NOTE — Telephone Encounter (Signed)
From: Lucia Gaskins, RN  Sent: 07/18/2023  12:27 PM EST  To: Elinor Dodge, LPN; Ferne Reus; *   This patient did not follow diet instructions and did not stop IRON for upcoming colonoscopy. Dr.Castaneda notified and he wants this patient to be rescheduled PAT completed today. Patient aware. Thanks   TCS ASA 3

## 2023-07-18 NOTE — Telephone Encounter (Signed)
 Left message to return call

## 2023-07-18 NOTE — Telephone Encounter (Signed)
Pt called into office returning call. Pt rescheduled for 08/16/23. New instructions will be mailed to pt. PA valid through 09/22/23

## 2023-07-18 NOTE — Progress Notes (Addendum)
Patient came in today for PAT visit. Patient states he had food last night at 5:00 pm and he has not stopped IRON supplement. I called and notified Dr.Castaneda. Dr.Castaneda states the patient needs to be rescheduled. Pt notified. I sent an IB message to [employer], Babette Relic and Darl Pikes at his office. PAT completed today.

## 2023-07-28 ENCOUNTER — Other Ambulatory Visit: Payer: Self-pay | Admitting: Internal Medicine

## 2023-07-28 MED ORDER — CARVEDILOL 12.5 MG PO TABS: 12.5000 mg | ORAL_TABLET | Freq: Two times a day (BID) | ORAL | 0 refills | Status: DC

## 2023-07-28 NOTE — Telephone Encounter (Signed)
Copied from CRM [id number]. Topic: Clinical - Medication Refill >> Jul 28, 2023 11:12 AM Dollene Primrose wrote: Most Recent Primary Care Visit:  Provider: Anabel Halon  Department: [facility]  Visit Type: OFFICE VISIT  Date: 06/07/2023  Medication: carvedilol (COREG) 12.5 MG tablet  Has the patient contacted their pharmacy? Yes-needs sent (Agent: If no, request that the patient contact the pharmacy for the refill. If patient does not wish to contact the pharmacy document the reason why and proceed with request.) (Agent: If yes, when and what did the pharmacy advise?)  Is this the correct pharmacy for this prescription? yes If no, delete pharmacy and type the correct one.  This is the patient's preferred pharmacy:  [facility] - [place], [place] - [address] [place] [address] Phone: [phone] Fax: [phone]   Has the prescription been filled recently? no  Is the patient out of the medication? yes  Has the patient been seen for an appointment in the last year OR does the patient have an upcoming appointment? yes  Can we respond through MyChart? yes  Agent: Please be advised that Rx refills may take up to 3 business days. We ask that you follow-up with your pharmacy.

## 2023-08-10 ENCOUNTER — Encounter: Payer: Self-pay | Admitting: Internal Medicine

## 2023-08-10 ENCOUNTER — Ambulatory Visit: Payer: Medicare HMO | Admitting: Family Medicine

## 2023-08-10 ENCOUNTER — Ambulatory Visit (INDEPENDENT_AMBULATORY_CARE_PROVIDER_SITE_OTHER): Payer: Medicare HMO | Admitting: Internal Medicine

## 2023-08-10 ENCOUNTER — Ambulatory Visit: Payer: Medicare HMO | Admitting: Internal Medicine

## 2023-08-10 VITALS — BP 146/84 | HR 84 | Ht 66.0 in | Wt 142.0 lb

## 2023-08-10 DIAGNOSIS — Z0001 Encounter for general adult medical examination with abnormal findings: Secondary | ICD-10-CM | POA: Diagnosis not present

## 2023-08-10 DIAGNOSIS — G894 Chronic pain syndrome: Secondary | ICD-10-CM

## 2023-08-10 DIAGNOSIS — I10 Essential (primary) hypertension: Secondary | ICD-10-CM | POA: Diagnosis not present

## 2023-08-10 DIAGNOSIS — I5032 Chronic diastolic (congestive) heart failure: Secondary | ICD-10-CM | POA: Diagnosis not present

## 2023-08-10 DIAGNOSIS — J439 Emphysema, unspecified: Secondary | ICD-10-CM | POA: Diagnosis not present

## 2023-08-10 DIAGNOSIS — M4722 Other spondylosis with radiculopathy, cervical region: Secondary | ICD-10-CM

## 2023-08-10 DIAGNOSIS — F411 Generalized anxiety disorder: Secondary | ICD-10-CM | POA: Diagnosis not present

## 2023-08-10 DIAGNOSIS — M51362 Other intervertebral disc degeneration, lumbar region with discogenic back pain and lower extremity pain: Secondary | ICD-10-CM | POA: Diagnosis not present

## 2023-08-10 DIAGNOSIS — E782 Mixed hyperlipidemia: Secondary | ICD-10-CM

## 2023-08-10 DIAGNOSIS — R739 Hyperglycemia, unspecified: Secondary | ICD-10-CM

## 2023-08-10 MED ORDER — CARVEDILOL 12.5 MG PO TABS: 12.5000 mg | ORAL_TABLET | Freq: Two times a day (BID) | ORAL | 5 refills | Status: DC

## 2023-08-10 MED ORDER — AMLODIPINE BESYLATE 5 MG PO TABS: 5.0000 mg | ORAL_TABLET | Freq: Every day | ORAL | 1 refills | Status: DC

## 2023-08-10 MED ORDER — ALPRAZOLAM 1 MG PO TABS: 0.5000 mg | ORAL_TABLET | Freq: Three times a day (TID) | ORAL | 3 refills | Status: DC | PRN

## 2023-08-10 MED ORDER — HYDROCODONE-ACETAMINOPHEN 10-325 MG PO TABS: 1.0000 | ORAL_TABLET | Freq: Four times a day (QID) | ORAL | 0 refills | Status: DC | PRN

## 2023-08-10 NOTE — Assessment & Plan Note (Signed)
 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

## 2023-08-10 NOTE — Assessment & Plan Note (Signed)
 Reviewed lipid profile Takes fenofibrate

## 2023-08-10 NOTE — Patient Instructions (Addendum)
 Please start taking Amlodipine 5 mg as prescribed. Please continue taking Lisinopril 20 mg once daily and Coreg 12.5 mg twice daily.  Please continue to take medications as prescribed.  Please continue to follow low salt diet and perform moderate exercise/walking as tolerated.  Please get fasting blood tests done before the next visit.

## 2023-08-10 NOTE — Assessment & Plan Note (Signed)
 Has chronic low back pain with radicular symptoms Currently takes Norco 10-325 mg q6h PRN - referred to pain clinic, but does not want to go to the pain management for now

## 2023-08-10 NOTE — Assessment & Plan Note (Signed)
 Physical exam as documented. Fasting blood tests ordered today. Advised to get Shingrix and Tdap vaccines at local pharmacy.

## 2023-08-10 NOTE — Assessment & Plan Note (Signed)
 Has tried SSRIs in the past, did not get much relief Has been taking Xanax 1 mg q8h PRN - PDMP reviewed After lengthy discussion, he has cut down dose of Xanax to 1 mg 3 times daily as needed for now

## 2023-08-10 NOTE — Assessment & Plan Note (Signed)
 Has chronic neck pain Currently takes Norco 10-325 mg q6h PRN - referred to pain clinic, dose was decreased, he had severe worsening of pain and fatigue On Norco 10-325 mg q6h PRN, have agreed to manage his chronic pain for now, given he decreases Xanax dosing - he expressed understanding Recently added gabapentin 300 mg nightly for persistent neck pain, has radicular pain towards UE and occipital are

## 2023-08-10 NOTE — Assessment & Plan Note (Addendum)
Well controlled with albuterol as needed ?

## 2023-08-10 NOTE — Progress Notes (Signed)
 Established Patient Office Visit  Subjective:  Patient ID: Russell [name], male    DOB: [birth year]  Age: 68 y.o. MRN: [id number]  CC:  Chief Complaint  Patient presents with   Pain    Three month follow up    Anxiety    Three month follow up     HPI Russell [name] is a 68 y.o. male with past medical history of HTN, aortic stenosis s/p aortic valve replacement, HLD, GAD, chronic pain syndrome and tobacco abuse who presents for annual physical.  Chronic pain syndrome from cervical spondylosis and lumbar DDD: He has chronic neck and back pain, worse with movement and is associated with numbness and weakness of the LE.  He is taking Norco 10-325 mg 4 times in a day. He has had worsening of his neck pain with lower dose of Norco and was not been able to complete his daily activities.  He also reports having severe fatigue. He has cut down use of Ibuprofen since increasing dose of Norco.  HTN: BP is elevated today. Takes carvedilol 12.5 mg twice daily and lisinopril 40 mg QD. He has run out of Amlodipine 5 mg. Patient denies headache, dizziness, chest pain, dyspnea or palpitations. He has seen [facility] cardiology clinic in 01/25 for routine follow-up.  He takes Lasix 20 mg QD for leg swelling.  GAD: He has tried SSRIs in the past.  He currently takes Xanax 1 mg 3 times in a day, but agrees to take maximum 3 tablets in a day as he prefers to continue his Norco at 10-325 mg dose.  Denies anhedonia.  He feels anxious at times due to his wife's medical conditions.  COPD: He uses albuterol as needed for dyspnea and wheezing.  He still smokes about 0.25 pack/day.  Past Medical History:  Diagnosis Date   Anxiety    Arthritis    Back pain    Cervical disc disease    C6-7   Chronic kidney disease    kidney cyst   COPD (chronic obstructive pulmonary disease) (HCC)    Essential hypertension    Lung nodules    Shortness of breath dyspnea    Sleep apnea    per wife, no sleep study    Past  Surgical History:  Procedure Laterality Date   AORTIC VALVE REPLACEMENT N/A 06/17/2014   Procedure: AORTIC VALVE REPLACEMENT (AVR);  Surgeon: Alleen Borne, MD;  Location: [facility] OR;  Service: Open Heart Surgery;  Laterality: N/A;   BACK SURGERY     CARDIAC CATHETERIZATION  05/23/14   COLONOSCOPY  10/19/2011   Procedure: COLONOSCOPY;  Surgeon: Dalia Heading, MD;  Location: [facility] SUITE;  Service: Gastroenterology;  Laterality: N/A;   LEFT AND RIGHT HEART CATHETERIZATION WITH CORONARY ANGIOGRAM N/A 05/23/2014   Procedure: LEFT AND RIGHT HEART CATHETERIZATION WITH CORONARY ANGIOGRAM;  Surgeon: Corky Crafts, MD;  Location: [facility] CATH LAB;  Service: Cardiovascular;  Laterality: N/A;   MITRAL VALVE REPAIR N/A 06/17/2014   Procedure: MITRAL VALVE REPAIR (MVR);  Surgeon: Alleen Borne, MD;  Location: [facility] OR;  Service: Open Heart Surgery;  Laterality: N/A;   Right knee surgery     Cartilage removed   TEE WITHOUT CARDIOVERSION N/A 06/17/2014   Procedure: TRANSESOPHAGEAL ECHOCARDIOGRAM (TEE);  Surgeon: Alleen Borne, MD;  Location: [facility] OR;  Service: Open Heart Surgery;  Laterality: N/A;    Family History  Problem Relation Age of Onset   Atrial fibrillation Mother    Heart disease  Father    Heart disease Maternal Grandfather     Social History   Socioeconomic History   Marital status: Married    Spouse name: Not on file   Number of children: Not on file   Years of education: Not on file   Highest education level: Not on file  Occupational History   Not on file  Tobacco Use   Smoking status: Every Day    Current packs/day: 0.25    Average packs/day: 0.3 packs/day for 55.1 years (13.8 ttl pk-yrs)    Types: Cigarettes    Start date: 06/22/1968   Smokeless tobacco: Never   Tobacco comments:    patient restarted smoking  Vaping Use   Vaping status: Never Used  Substance and Sexual Activity   Alcohol use: No    Alcohol/week: 0.0 standard drinks of alcohol   Drug use: No   Sexual activity:  Yes  Other Topics Concern   Not on file  Social History Narrative   Not on file   Social Drivers of Health   Financial Resource Strain: Not on file  Food Insecurity: Not on file  Transportation Needs: Not on file  Physical Activity: Not on file  Stress: Not on file  Social Connections: Not on file  Intimate Partner Violence: Not on file    Outpatient Medications Prior to Visit  Medication Sig Dispense Refill   albuterol (PROVENTIL HFA;VENTOLIN HFA) 108 (90 BASE) MCG/ACT inhaler Inhale 1-2 puffs into the lungs every 6 (six) hours as needed for wheezing or shortness of breath.     aspirin EC 81 MG tablet Take 1 tablet (81 mg total) by mouth daily.     fenofibrate 54 MG tablet Take 54 mg by mouth daily.     ferrous gluconate (FERGON) 324 MG tablet Take 1 tablet (324 mg total) by mouth 2 (two) times daily with a meal. 60 tablet 1   furosemide (LASIX) 20 MG tablet Take 1 tablet (20 mg total) by mouth daily. 90 tablet 1   gabapentin (NEURONTIN) 300 MG capsule Take 1 capsule (300 mg total) by mouth at bedtime. 90 capsule 1   lisinopril (ZESTRIL) 40 MG tablet Take 1 tablet (40 mg total) by mouth daily. 90 tablet 3   Multiple Vitamin (MULTI-VITAMIN PO) Take 1 tablet by mouth 2 (two) times daily.      ondansetron (ZOFRAN) 4 MG tablet Take 4 mg by mouth as needed.      ALPRAZolam (XANAX) 1 MG tablet Take 0.5-1 tablets (0.5-1 mg total) by mouth 3 (three) times daily as needed for anxiety. 90 tablet 0   amLODipine (NORVASC) 5 MG tablet Take 5 mg by mouth daily.     carvedilol (COREG) 12.5 MG tablet Take 1 tablet (12.5 mg total) by mouth 2 (two) times daily. 60 tablet 0   HYDROcodone-acetaminophen (NORCO) 10-325 MG tablet Take 1 tablet by mouth every 6 (six) hours as needed. 120 tablet 0   No facility-administered medications prior to visit.    Allergies  Allergen Reactions   Asa [Aspirin] Diarrhea and Nausea And Vomiting   Prozac [Fluoxetine Hcl] Other (See Comments)    confusion     ROS Review of Systems  Constitutional:  Negative for chills and fever.  HENT:  Negative for congestion and sore throat.   Eyes:  Negative for pain and discharge.  Respiratory:  Negative for cough and shortness of breath.   Cardiovascular:  Negative for chest pain and palpitations.  Gastrointestinal:  Negative for diarrhea, nausea  and vomiting.  Endocrine: Negative for polydipsia and polyuria.  Genitourinary:  Negative for dysuria and hematuria.  Musculoskeletal:  Positive for back pain, gait problem and neck pain. Negative for neck stiffness.  Skin:  Negative for rash.  Neurological:  Positive for weakness (B/l LE) and numbness. Negative for dizziness and headaches.  Psychiatric/Behavioral:  Positive for sleep disturbance. Negative for agitation and behavioral problems. The patient is nervous/anxious.       Objective:    Physical Exam Vitals reviewed.  Constitutional:      General: He is not in acute distress.    Appearance: He is not diaphoretic.  HENT:     Head: Normocephalic and atraumatic.     Nose: Nose normal.     Mouth/Throat:     Mouth: Mucous membranes are moist.  Eyes:     General: No scleral icterus.    Extraocular Movements: Extraocular movements intact.  Cardiovascular:     Rate and Rhythm: Normal rate and regular rhythm.     Pulses: Normal pulses.     Heart sounds: No murmur heard. Pulmonary:     Breath sounds: Normal breath sounds. No wheezing or rales.  Abdominal:     Palpations: Abdomen is soft.     Tenderness: There is no abdominal tenderness.  Musculoskeletal:     Cervical back: Neck supple. Tenderness present. Pain with movement present. Decreased range of motion.     Lumbar back: Tenderness present. Decreased range of motion.     Right lower leg: No edema.     Left lower leg: No edema.     Comments: Lumbar back brace in place  Skin:    General: Skin is warm.     Findings: No rash.  Neurological:     General: No focal deficit present.      Mental Status: He is alert and oriented to person, place, and time.     Sensory: Sensory deficit (B/l LE) present.     Motor: Weakness (B/l LE - 4/5) present.  Psychiatric:        Mood and Affect: Mood normal.        Behavior: Behavior normal.     BP (!) 146/84 (BP Location: Left Arm)   Pulse 84   Ht 5\' 6"  (1.676 m)   Wt 142 lb (64.4 kg)   SpO2 92%   BMI 22.92 kg/m  Wt Readings from Last 3 Encounters:  08/10/23 142 lb (64.4 kg)  07/18/23 141 lb 8.6 oz (64.2 kg)  06/23/23 141 lb 9.6 oz (64.2 kg)    Lab Results  Component Value Date   TSH 0.638 05/16/2014   Lab Results  Component Value Date   WBC 11.6 (H) 01/05/2023   HGB 14.1 01/05/2023   HCT 43.0 01/05/2023   MCV 92.5 01/05/2023   PLT 265 01/05/2023   Lab Results  Component Value Date   NA 140 07/18/2023   K 4.0 07/18/2023   CO2 26 07/18/2023   GLUCOSE 93 07/18/2023   BUN 9 07/18/2023   CREATININE 0.93 07/18/2023   BILITOT 0.4 01/05/2023   ALKPHOS 85 07/16/2014   AST 19 01/05/2023   ALT 13 01/05/2023   PROT 7.2 01/05/2023   ALBUMIN 4.0 07/16/2014   CALCIUM 10.6 (H) 07/18/2023   ANIONGAP 10 07/18/2023   EGFR 77 01/05/2023   Lab Results  Component Value Date   CHOL 163 01/05/2023   Lab Results  Component Value Date   HDL 59 01/05/2023   Lab Results  Component  Value Date   LDLCALC 82 01/05/2023   Lab Results  Component Value Date   TRIG 121 01/05/2023   Lab Results  Component Value Date   CHOLHDL 2.8 01/05/2023   Lab Results  Component Value Date   HGBA1C 6.0 (H) 06/13/2014      Assessment & Plan:   Problem List Items Addressed This Visit       Cardiovascular and Mediastinum   Essential hypertension   BP Readings from Last 1 Encounters:  08/10/23 (!) 146/84   Uncontrolled with carvedilol 12.5 mg BID, lisinopril 40 mg QD and Lasix 20 mg QD - anxious as his wife is in ER Start Amlodipine 5 mg QD again as it has been elevated before as well Counseled for compliance with the  medications Advised DASH diet and moderate exercise/walking, at least 150 mins/week      Relevant Medications   amLODipine (NORVASC) 5 MG tablet   carvedilol (COREG) 12.5 MG tablet   Other Relevant Orders   CMP14+EGFR   CBC with Differential/Platelet   Heart failure with improved ejection fraction (HFimpEF) (HCC)   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 -LVEF has recovered on GDMT post AVR/MVR  Euvolemic on examination today -takes Lasix 20 mg QD with potassium supplement Discontinue potassium supplement as his potential level has been higher normal, also takes lisinopril Follow-up with Cardiology at [facility]      Relevant Medications   amLODipine (NORVASC) 5 MG tablet   carvedilol (COREG) 12.5 MG tablet     Respiratory   Pulmonary emphysema (HCC)   Well controlled with albuterol as needed        Nervous and Auditory   Cervical spondylosis with radiculopathy   Has chronic neck pain Currently takes Norco 10-325 mg q6h PRN - referred to pain clinic, dose was decreased, he had severe worsening of pain and fatigue On Norco 10-325 mg q6h PRN, have agreed to manage his chronic pain for now, given he decreases Xanax dosing - he expressed understanding Recently added gabapentin 300 mg nightly for persistent neck pain, has radicular pain towards UE and occipital are      Relevant Medications   ALPRAZolam (XANAX) 1 MG tablet   HYDROcodone-acetaminophen (NORCO) 10-325 MG tablet     Musculoskeletal and Integument   DDD (degenerative disc disease), lumbar   Has chronic low back pain with radicular symptoms Currently takes Norco 10-325 mg q6h PRN - referred to pain clinic, but does not want to go to the pain management for now      Relevant Medications   HYDROcodone-acetaminophen (NORCO) 10-325 MG tablet     Other   GAD (generalized anxiety disorder)   Has tried SSRIs in the past, did not get much relief Has been taking Xanax 1 mg q8h PRN - PDMP reviewed After lengthy discussion,  he has cut down dose of Xanax to 1 mg 3 times daily as needed for now      Relevant Medications   ALPRAZolam (XANAX) 1 MG tablet   Mixed hyperlipidemia   Reviewed lipid profile Takes fenofibrate      Relevant Medications   amLODipine (NORVASC) 5 MG tablet   carvedilol (COREG) 12.5 MG tablet   Other Relevant Orders   Lipid Profile   Chronic pain syndrome   From cervical spondylosis and DDD of lumbar spine Takes chronic opioids, also on chronic benzodiazepines -referred to [facility] pain clinic for chronic opioid management, but was not pleased with experience I have agreed to manage chronic  pain for now - if higher dosing needed, will refer to [facility]      Relevant Medications   HYDROcodone-acetaminophen (NORCO) 10-325 MG tablet   Encounter for general adult medical examination with abnormal findings - Primary   Physical exam as documented. Fasting blood tests ordered today. Advised to get Shingrix and Tdap vaccines at local pharmacy.      Other Visit Diagnoses       Hyperglycemia       Relevant Orders   CMP14+EGFR   Hemoglobin A1c        Meds ordered this encounter  Medications   amLODipine (NORVASC) 5 MG tablet    Sig: Take 1 tablet (5 mg total) by mouth daily.    Dispense:  90 tablet    Refill:  1   carvedilol (COREG) 12.5 MG tablet    Sig: Take 1 tablet (12.5 mg total) by mouth 2 (two) times daily.    Dispense:  60 tablet    Refill:  5   ALPRAZolam (XANAX) 1 MG tablet    Sig: Take 0.5-1 tablets (0.5-1 mg total) by mouth 3 (three) times daily as needed for anxiety.    Dispense:  90 tablet    Refill:  3    Dose change   HYDROcodone-acetaminophen (NORCO) 10-325 MG tablet    Sig: Take 1 tablet by mouth every 6 (six) hours as needed.    Dispense:  120 tablet    Refill:  0    Follow-up: Return in about 3 months (around 11/07/2023) for HTN, chronic pain, GAD.    Anabel Halon, MD

## 2023-08-10 NOTE — Assessment & Plan Note (Signed)
 From cervical spondylosis and DDD of lumbar spine Takes chronic opioids, also on chronic benzodiazepines -referred to [facility] pain clinic for chronic opioid management, but was not pleased with experience I have agreed to manage chronic pain for now - if higher dosing needed, will refer to [facility]

## 2023-08-10 NOTE — Assessment & Plan Note (Addendum)
 BP Readings from Last 1 Encounters:  08/10/23 (!) 146/84   Uncontrolled with carvedilol 12.5 mg BID, lisinopril 40 mg QD and Lasix 20 mg QD - anxious as his wife is in ER Start Amlodipine 5 mg QD again as it has been elevated before as well Counseled for compliance with the medications Advised DASH diet and moderate exercise/walking, at least 150 mins/week

## 2023-08-16 ENCOUNTER — Ambulatory Visit (HOSPITAL_BASED_OUTPATIENT_CLINIC_OR_DEPARTMENT_OTHER): Admitting: Anesthesiology

## 2023-08-16 ENCOUNTER — Ambulatory Visit: Payer: Medicare HMO

## 2023-08-16 ENCOUNTER — Ambulatory Visit (HOSPITAL_COMMUNITY): Admitting: Anesthesiology

## 2023-08-16 ENCOUNTER — Encounter (HOSPITAL_COMMUNITY): Payer: Self-pay | Admitting: Gastroenterology

## 2023-08-16 ENCOUNTER — Ambulatory Visit (HOSPITAL_COMMUNITY)
Admission: RE | Admit: 2023-08-16 | Discharge: 2023-08-16 | Disposition: A | Discharge status: Home / Self Care | Payer: Medicare HMO | Attending: Gastroenterology | Admitting: Gastroenterology

## 2023-08-16 ENCOUNTER — Encounter (HOSPITAL_COMMUNITY)
Admission: RE | Disposition: A | Discharge status: Home / Self Care | Payer: Self-pay | Source: Home / Self Care | Attending: Gastroenterology

## 2023-08-16 DIAGNOSIS — Z952 Presence of prosthetic heart valve: Secondary | ICD-10-CM | POA: Insufficient documentation

## 2023-08-16 DIAGNOSIS — F17210 Nicotine dependence, cigarettes, uncomplicated: Secondary | ICD-10-CM | POA: Insufficient documentation

## 2023-08-16 DIAGNOSIS — D124 Benign neoplasm of descending colon: Secondary | ICD-10-CM

## 2023-08-16 DIAGNOSIS — N189 Chronic kidney disease, unspecified: Secondary | ICD-10-CM | POA: Diagnosis not present

## 2023-08-16 DIAGNOSIS — I10 Essential (primary) hypertension: Secondary | ICD-10-CM

## 2023-08-16 DIAGNOSIS — R195 Other fecal abnormalities: Secondary | ICD-10-CM | POA: Diagnosis not present

## 2023-08-16 DIAGNOSIS — D122 Benign neoplasm of ascending colon: Secondary | ICD-10-CM | POA: Insufficient documentation

## 2023-08-16 DIAGNOSIS — K5730 Diverticulosis of large intestine without perforation or abscess without bleeding: Secondary | ICD-10-CM | POA: Insufficient documentation

## 2023-08-16 DIAGNOSIS — J449 Chronic obstructive pulmonary disease, unspecified: Secondary | ICD-10-CM | POA: Insufficient documentation

## 2023-08-16 DIAGNOSIS — Z1211 Encounter for screening for malignant neoplasm of colon: Secondary | ICD-10-CM | POA: Insufficient documentation

## 2023-08-16 DIAGNOSIS — D120 Benign neoplasm of cecum: Secondary | ICD-10-CM

## 2023-08-16 DIAGNOSIS — K635 Polyp of colon: Secondary | ICD-10-CM

## 2023-08-16 DIAGNOSIS — G4730 Sleep apnea, unspecified: Secondary | ICD-10-CM | POA: Insufficient documentation

## 2023-08-16 DIAGNOSIS — I129 Hypertensive chronic kidney disease with stage 1 through stage 4 chronic kidney disease, or unspecified chronic kidney disease: Secondary | ICD-10-CM | POA: Insufficient documentation

## 2023-08-16 DIAGNOSIS — F17200 Nicotine dependence, unspecified, uncomplicated: Secondary | ICD-10-CM | POA: Diagnosis not present

## 2023-08-16 HISTORY — PX: POLYPECTOMY: SHX149

## 2023-08-16 HISTORY — PX: COLONOSCOPY WITH PROPOFOL: SHX5780

## 2023-08-16 LAB — HM COLONOSCOPY

## 2023-08-16 SURGERY — COLONOSCOPY WITH PROPOFOL
Anesthesia: General

## 2023-08-16 MED ORDER — PROPOFOL 500 MG/50ML IV EMUL
INTRAVENOUS | Status: DC | PRN
  Administered 2023-08-16: 150 ug/kg/min via INTRAVENOUS

## 2023-08-16 MED ORDER — PHENYLEPHRINE 80 MCG/ML (10ML) SYRINGE FOR IV PUSH (FOR BLOOD PRESSURE SUPPORT)
PREFILLED_SYRINGE | INTRAVENOUS | Status: DC | PRN
  Administered 2023-08-16 (×3): 160 ug via INTRAVENOUS

## 2023-08-16 MED ORDER — PHENYLEPHRINE 80 MCG/ML (10ML) SYRINGE FOR IV PUSH (FOR BLOOD PRESSURE SUPPORT)
PREFILLED_SYRINGE | INTRAVENOUS | Status: AC
  Filled 2023-08-16: qty 10

## 2023-08-16 MED ORDER — PROPOFOL 10 MG/ML IV BOLUS
INTRAVENOUS | Status: DC | PRN
Start: 2023-08-16 — End: 2023-08-16
  Administered 2023-08-16: 100 mg via INTRAVENOUS
  Administered 2023-08-16: 30 mg via INTRAVENOUS

## 2023-08-16 MED ORDER — LIDOCAINE HCL (PF) 2 % IJ SOLN
INTRAMUSCULAR | Status: DC | PRN
  Administered 2023-08-16: 50 mg via INTRADERMAL

## 2023-08-16 MED ORDER — LACTATED RINGERS IV SOLN: INTRAVENOUS | Status: DC

## 2023-08-16 NOTE — Discharge Instructions (Signed)
 You are being discharged to home.  Resume your previous diet.  We are waiting for your pathology results.  Your physician has recommended a repeat colonoscopy for surveillance based on pathology results.

## 2023-08-16 NOTE — Op Note (Signed)
 [facility] Patient Name: Russell [name] Procedure Date: 08/16/2023 12:19 PM MRN: [id number] Date of Birth: 19-Nov-1955 Attending MD: Katrinka Blazing , , [id number] CSN: [id number] Age: 68 Admit Type: Outpatient Procedure:                Colonoscopy Indications:              Positive Cologuard test Providers:                Katrinka Blazing, Crystal Page, Pandora Leiter,                            Technician Referring MD:              Medicines:                Monitored Anesthesia Care Complications:            No immediate complications. Estimated Blood Loss:     Estimated blood loss: none. Procedure:                Pre-Anesthesia Assessment:                           - Prior to the procedure, a History and Physical                            was performed, and patient medications, allergies                            and sensitivities were reviewed. The patient's                            tolerance of previous anesthesia was reviewed.                           - The risks and benefits of the procedure and the                            sedation options and risks were discussed with the                            patient. All questions were answered and informed                            consent was obtained.                           - ASA Grade Assessment: II - A patient with mild                            systemic disease.                           After obtaining informed consent, the colonoscope                            was passed under direct vision. Throughout the  procedure, the patient's blood pressure, pulse, and                            oxygen saturations were monitored continuously. The                            PCF-HQ190L ([id number]) scope was introduced through                            the anus and advanced to the the cecum, identified                            by appendiceal orifice and ileocecal valve. The                             colonoscopy was performed without difficulty. The                            patient tolerated the procedure well. The quality                            of the bowel preparation was good. Scope In: 12:59:20 PM Scope Out: 1:26:14 PM Scope Withdrawal Time: 0 hours 21 minutes 32 seconds  Total Procedure Duration: 0 hours 26 minutes 54 seconds  Findings:      The perianal and digital rectal examinations were normal.      Four sessile polyps were found in the ascending colon and cecum. The       polyps were 3 to 5 mm in size. These polyps were removed with a cold       snare. Resection and retrieval were complete.      A 3 mm polyp was found in the descending colon. The polyp was sessile.       The polyp was removed with a cold snare. Resection and retrieval were       complete.      A few small-mouthed diverticula were found in the sigmoid colon.      The retroflexed view of the distal rectum and anal verge was normal and       showed no anal or rectal abnormalities. Impression:               - Four 3 to 5 mm polyps in the ascending colon and                            in the cecum, removed with a cold snare. Resected                            and retrieved.                           - One 3 mm polyp in the descending colon, removed                            with a cold snare. Resected and retrieved.                           -  Diverticulosis in the sigmoid colon.                           - The distal rectum and anal verge are normal on                            retroflexion view. Moderate Sedation:      Per Anesthesia Care Recommendation:           - Discharge patient to home (ambulatory).                           - Resume previous diet.                           - Await pathology results.                           - Repeat colonoscopy for surveillance based on                            pathology results. Procedure Code(s):        --- Professional ---                            [phone], Colonoscopy, flexible; with removal of                            tumor(s), polyp(s), or other lesion(s) by snare                            technique Diagnosis Code(s):        --- Professional ---                           D12.2, Benign neoplasm of ascending colon                           D12.0, Benign neoplasm of cecum                           D12.4, Benign neoplasm of descending colon                           R19.5, Other fecal abnormalities                           K57.30, Diverticulosis of large intestine without                            perforation or abscess without bleeding CPT copyright 2022 [employer]. All rights reserved. The codes documented in this report are preliminary and upon coder review may  be revised to meet current compliance requirements. Katrinka Blazing, MD Katrinka Blazing,  08/16/2023 1:31:22 PM This report has been signed electronically. Number of Addenda: 0

## 2023-08-16 NOTE — H&P (Signed)
 Russell [name] is an 68 y.o. male.   Chief Complaint: Positive Cologuard HPI: 68 year old male with past medical history of COPD, arthritis, CKD, sleep apnea, coming for positive Cologuard.  The patient denies having any nausea, vomiting, fever, chills, hematochezia, melena, hematemesis, abdominal distention, abdominal pain, diarrhea, jaundice, pruritus or weight loss.   Past Medical History:  Diagnosis Date   Anxiety    Arthritis    Back pain    Cervical disc disease    C6-7   Chronic kidney disease    kidney cyst   COPD (chronic obstructive pulmonary disease) (HCC)    Essential hypertension    Lung nodules    Shortness of breath dyspnea    Sleep apnea    per wife, no sleep study    Past Surgical History:  Procedure Laterality Date   AORTIC VALVE REPLACEMENT N/A 06/17/2014   Procedure: AORTIC VALVE REPLACEMENT (AVR);  Surgeon: Alleen Borne, MD;  Location: [facility] OR;  Service: Open Heart Surgery;  Laterality: N/A;   BACK SURGERY     CARDIAC CATHETERIZATION  05/23/14   COLONOSCOPY  10/19/2011   Procedure: COLONOSCOPY;  Surgeon: Dalia Heading, MD;  Location: [facility] SUITE;  Service: Gastroenterology;  Laterality: N/A;   LEFT AND RIGHT HEART CATHETERIZATION WITH CORONARY ANGIOGRAM N/A 05/23/2014   Procedure: LEFT AND RIGHT HEART CATHETERIZATION WITH CORONARY ANGIOGRAM;  Surgeon: Corky Crafts, MD;  Location: [facility] CATH LAB;  Service: Cardiovascular;  Laterality: N/A;   MITRAL VALVE REPAIR N/A 06/17/2014   Procedure: MITRAL VALVE REPAIR (MVR);  Surgeon: Alleen Borne, MD;  Location: [facility] OR;  Service: Open Heart Surgery;  Laterality: N/A;   Right knee surgery     Cartilage removed   TEE WITHOUT CARDIOVERSION N/A 06/17/2014   Procedure: TRANSESOPHAGEAL ECHOCARDIOGRAM (TEE);  Surgeon: Alleen Borne, MD;  Location: [facility] OR;  Service: Open Heart Surgery;  Laterality: N/A;    Family History  Problem Relation Age of Onset   Atrial fibrillation Mother    Heart disease Father    Heart  disease Maternal Grandfather    Social History:  reports that he has been smoking cigarettes. He started smoking about 55 years ago. He has a 13.8 pack-year smoking history. He has never used smokeless tobacco. He reports that he does not drink alcohol and does not use drugs.  Allergies:  Allergies  Allergen Reactions   Asa [Aspirin] Diarrhea and Nausea And Vomiting   Prozac [Fluoxetine Hcl] Other (See Comments)    confusion    Medications Prior to Admission  Medication Sig Dispense Refill   ALPRAZolam (XANAX) 1 MG tablet Take 0.5-1 tablets (0.5-1 mg total) by mouth 3 (three) times daily as needed for anxiety. 90 tablet 3   amLODipine (NORVASC) 5 MG tablet Take 1 tablet (5 mg total) by mouth daily. 90 tablet 1   aspirin EC 81 MG tablet Take 1 tablet (81 mg total) by mouth daily.     carvedilol (COREG) 12.5 MG tablet Take 1 tablet (12.5 mg total) by mouth 2 (two) times daily. 60 tablet 5   fenofibrate 54 MG tablet Take 54 mg by mouth daily.     ferrous gluconate (FERGON) 324 MG tablet Take 1 tablet (324 mg total) by mouth 2 (two) times daily with a meal. 60 tablet 1   furosemide (LASIX) 20 MG tablet Take 1 tablet (20 mg total) by mouth daily. 90 tablet 1   gabapentin (NEURONTIN) 300 MG capsule Take 1 capsule (300 mg total) by  mouth at bedtime. 90 capsule 1   HYDROcodone-acetaminophen (NORCO) 10-325 MG tablet Take 1 tablet by mouth every 6 (six) hours as needed. 120 tablet 0   lisinopril (ZESTRIL) 40 MG tablet Take 1 tablet (40 mg total) by mouth daily. 90 tablet 3   Multiple Vitamin (MULTI-VITAMIN PO) Take 1 tablet by mouth 2 (two) times daily.      ondansetron (ZOFRAN) 4 MG tablet Take 4 mg by mouth as needed.      albuterol (PROVENTIL HFA;VENTOLIN HFA) 108 (90 BASE) MCG/ACT inhaler Inhale 1-2 puffs into the lungs every 6 (six) hours as needed for wheezing or shortness of breath.      No results found for this or any previous visit (from the past 48 hours). No results found.  Review  of Systems  All other systems reviewed and are negative.   Blood pressure (!) 146/74, temperature 97.8 F (36.6 C), resp. rate 12, height 5\' 6"  (1.676 m), weight 64.4 kg, SpO2 98%. Physical Exam  GENERAL: The patient is AO x3, in no acute distress. HEENT: Head is normocephalic and atraumatic. EOMI are intact. Mouth is well hydrated and without lesions. NECK: Supple. No masses LUNGS: Clear to auscultation. No presence of rhonchi/wheezing/rales. Adequate chest expansion HEART: RRR, normal s1 and s2. ABDOMEN: Soft, nontender, no guarding, no peritoneal signs, and nondistended. BS +. No masses. EXTREMITIES: Without any cyanosis, clubbing, rash, lesions or edema. NEUROLOGIC: AOx3, no focal motor deficit. SKIN: no jaundice, no rashes  Assessment/Plan  68 year old male with past medical history of COPD, arthritis, CKD, sleep apnea, coming for positive Cologuard.  Will proceed with colonoscopy.  Dolores Frame, MD 08/16/2023, 12:44 PM

## 2023-08-16 NOTE — Anesthesia Preprocedure Evaluation (Signed)
 Anesthesia Evaluation  Patient identified by MRN, date of birth, ID band Patient awake    Reviewed: Allergy & Precautions, H&P , NPO status , Patient's Chart, lab work & pertinent test results, reviewed documented beta blocker date and time   Airway Mallampati: II  TM Distance: >3 FB Neck ROM: full    Dental no notable dental hx.    Pulmonary neg pulmonary ROS, shortness of breath, sleep apnea , COPD, Current Smoker   Pulmonary exam normal breath sounds clear to auscultation       Cardiovascular Exercise Tolerance: Good hypertension, negative cardio ROS  Rhythm:regular Rate:Normal     Neuro/Psych  PSYCHIATRIC DISORDERS Anxiety      Neuromuscular disease negative neurological ROS  negative psych ROS   GI/Hepatic negative GI ROS, Neg liver ROS,,,  Endo/Other  negative endocrine ROS    Renal/GU Renal diseasenegative Renal ROS  negative genitourinary   Musculoskeletal   Abdominal   Peds  Hematology negative hematology ROS (+)   Anesthesia Other Findings   Reproductive/Obstetrics negative OB ROS                             Anesthesia Physical Anesthesia Plan  ASA: 3  Anesthesia Plan: General   Post-op Pain Management:    Induction:   PONV Risk Score and Plan: Propofol infusion  Airway Management Planned:   Additional Equipment:   Intra-op Plan:   Post-operative Plan:   Informed Consent: I have reviewed the patients History and Physical, chart, labs and discussed the procedure including the risks, benefits and alternatives for the proposed anesthesia with the patient or authorized representative who has indicated his/her understanding and acceptance.     Dental Advisory Given  Plan Discussed with: CRNA  Anesthesia Plan Comments:        Anesthesia Quick Evaluation

## 2023-08-16 NOTE — Transfer of Care (Signed)
 Immediate Anesthesia Transfer of Care Note  Patient: Russell [name]  Procedure(s) Performed: COLONOSCOPY WITH PROPOFOL POLYPECTOMY, INTESTINE  Patient Location: Short Stay  Anesthesia Type:General  Level of Consciousness: drowsy  Airway & Oxygen Therapy: Patient Spontanous Breathing  Post-op Assessment: Report given to RN and Post -op Vital signs reviewed and stable  Post vital signs: Reviewed and stable  Last Vitals:  Vitals Value Taken Time  BP    Temp 36.4 C 08/16/23 1331  Pulse    Resp    SpO2      Last Pain:  Vitals:   08/16/23 1331  TempSrc: Oral  PainSc:       Patients Stated Pain Goal: 4 (08/16/23 1048)  Complications: No notable events documented.

## 2023-08-16 NOTE — Anesthesia Procedure Notes (Signed)
 Date/Time: 08/16/2023 12:53 PM  Performed by: Julian Reil, CRNAPre-anesthesia Checklist: Patient identified, Emergency Drugs available, Suction available and Patient being monitored Patient Re-evaluated:Patient Re-evaluated prior to induction Oxygen Delivery Method: Nasal cannula Induction Type: IV induction Placement Confirmation: positive ETCO2

## 2023-08-17 ENCOUNTER — Encounter (INDEPENDENT_AMBULATORY_CARE_PROVIDER_SITE_OTHER): Payer: Self-pay | Admitting: *Deleted

## 2023-08-17 ENCOUNTER — Encounter (HOSPITAL_COMMUNITY): Payer: Self-pay | Admitting: Gastroenterology

## 2023-08-17 LAB — SURGICAL PATHOLOGY

## 2023-08-20 NOTE — Anesthesia Postprocedure Evaluation (Signed)
 Anesthesia Post Note  Patient: Russell [name]  Procedure(s) Performed: COLONOSCOPY WITH PROPOFOL POLYPECTOMY, INTESTINE  Patient location during evaluation: Phase II Anesthesia Type: General Level of consciousness: awake Pain management: pain level controlled Vital Signs Assessment: post-procedure vital signs reviewed and stable Respiratory status: spontaneous breathing and respiratory function stable Cardiovascular status: blood pressure returned to baseline and stable Postop Assessment: no headache and no apparent nausea or vomiting Anesthetic complications: no Comments: Late entry   No notable events documented.   Last Vitals:  Vitals:   08/16/23 1331 08/16/23 1340  BP: (!) 96/48 (!) 121/55  Pulse: 61 (!) 58  Resp: 17 14  Temp: (!) 36.4 C   SpO2: 96% 98%    Last Pain:  Vitals:   08/17/23 1332  TempSrc:   PainSc: 0-No pain                 Windell Norfolk

## 2023-08-24 ENCOUNTER — Encounter (HOSPITAL_COMMUNITY)
Admission: RE | Admit: 2023-08-24 | Discharge: 2023-08-24 | Disposition: A | Discharge status: Home / Self Care | Payer: Medicare HMO | Source: Ambulatory Visit | Attending: Gastroenterology | Admitting: Gastroenterology

## 2023-09-04 ENCOUNTER — Other Ambulatory Visit: Payer: Self-pay | Admitting: Family Medicine

## 2023-09-04 DIAGNOSIS — R0609 Other forms of dyspnea: Secondary | ICD-10-CM

## 2023-09-04 DIAGNOSIS — I10 Essential (primary) hypertension: Secondary | ICD-10-CM

## 2023-09-08 ENCOUNTER — Other Ambulatory Visit: Payer: Self-pay | Admitting: Internal Medicine

## 2023-09-08 ENCOUNTER — Other Ambulatory Visit: Payer: Self-pay | Admitting: Family Medicine

## 2023-09-08 DIAGNOSIS — M4722 Other spondylosis with radiculopathy, cervical region: Secondary | ICD-10-CM

## 2023-09-08 DIAGNOSIS — R0609 Other forms of dyspnea: Secondary | ICD-10-CM

## 2023-09-08 DIAGNOSIS — I10 Essential (primary) hypertension: Secondary | ICD-10-CM

## 2023-09-08 DIAGNOSIS — G894 Chronic pain syndrome: Secondary | ICD-10-CM

## 2023-09-08 DIAGNOSIS — M51362 Other intervertebral disc degeneration, lumbar region with discogenic back pain and lower extremity pain: Secondary | ICD-10-CM

## 2023-09-09 ENCOUNTER — Other Ambulatory Visit: Payer: Self-pay | Admitting: Internal Medicine

## 2023-09-13 ENCOUNTER — Other Ambulatory Visit: Payer: Self-pay | Admitting: Family Medicine

## 2023-09-13 DIAGNOSIS — I10 Essential (primary) hypertension: Secondary | ICD-10-CM

## 2023-09-13 DIAGNOSIS — R0609 Other forms of dyspnea: Secondary | ICD-10-CM

## 2023-09-15 ENCOUNTER — Other Ambulatory Visit: Payer: Self-pay | Admitting: Internal Medicine

## 2023-09-15 ENCOUNTER — Other Ambulatory Visit: Payer: Self-pay | Admitting: Family Medicine

## 2023-09-15 DIAGNOSIS — I10 Essential (primary) hypertension: Secondary | ICD-10-CM

## 2023-09-15 DIAGNOSIS — R0609 Other forms of dyspnea: Secondary | ICD-10-CM

## 2023-10-06 ENCOUNTER — Other Ambulatory Visit: Payer: Self-pay | Admitting: Internal Medicine

## 2023-10-06 DIAGNOSIS — M51362 Other intervertebral disc degeneration, lumbar region with discogenic back pain and lower extremity pain: Secondary | ICD-10-CM

## 2023-10-06 DIAGNOSIS — M4722 Other spondylosis with radiculopathy, cervical region: Secondary | ICD-10-CM

## 2023-10-06 DIAGNOSIS — G894 Chronic pain syndrome: Secondary | ICD-10-CM

## 2023-10-22 ENCOUNTER — Other Ambulatory Visit: Payer: Self-pay | Admitting: Internal Medicine

## 2023-10-22 DIAGNOSIS — M4722 Other spondylosis with radiculopathy, cervical region: Secondary | ICD-10-CM

## 2023-10-31 ENCOUNTER — Ambulatory Visit

## 2023-10-31 DIAGNOSIS — I10 Essential (primary) hypertension: Secondary | ICD-10-CM | POA: Diagnosis not present

## 2023-11-01 LAB — BASIC METABOLIC PANEL WITH GFR
BUN/Creatinine Ratio: 9 — ABNORMAL LOW (ref 10–24)
BUN: 9 mg/dL (ref 8–27)
CO2: 22 mmol/L (ref 20–29)
Calcium: 10.8 mg/dL — ABNORMAL HIGH (ref 8.6–10.2)
Chloride: 105 mmol/L (ref 96–106)
Creatinine, Ser: 1 mg/dL (ref 0.76–1.27)
Glucose: 93 mg/dL (ref 70–99)
Potassium: 4.3 mmol/L (ref 3.5–5.2)
Sodium: 141 mmol/L (ref 134–144)
eGFR: 81 mL/min/{1.73_m2} (ref >59)

## 2023-11-04 ENCOUNTER — Other Ambulatory Visit: Payer: Self-pay | Admitting: Internal Medicine

## 2023-11-04 DIAGNOSIS — M51362 Other intervertebral disc degeneration, lumbar region with discogenic back pain and lower extremity pain: Secondary | ICD-10-CM

## 2023-11-04 DIAGNOSIS — G894 Chronic pain syndrome: Secondary | ICD-10-CM

## 2023-11-04 DIAGNOSIS — M4722 Other spondylosis with radiculopathy, cervical region: Secondary | ICD-10-CM

## 2023-11-08 ENCOUNTER — Ambulatory Visit (INDEPENDENT_AMBULATORY_CARE_PROVIDER_SITE_OTHER): Payer: Medicare HMO | Admitting: Internal Medicine

## 2023-11-08 ENCOUNTER — Encounter: Payer: Self-pay | Admitting: Internal Medicine

## 2023-11-08 VITALS — BP 134/86 | HR 64 | Ht 66.0 in | Wt 140.2 lb

## 2023-11-08 DIAGNOSIS — M4722 Other spondylosis with radiculopathy, cervical region: Secondary | ICD-10-CM

## 2023-11-08 DIAGNOSIS — R739 Hyperglycemia, unspecified: Secondary | ICD-10-CM

## 2023-11-08 DIAGNOSIS — G894 Chronic pain syndrome: Secondary | ICD-10-CM

## 2023-11-08 DIAGNOSIS — I10 Essential (primary) hypertension: Secondary | ICD-10-CM

## 2023-11-08 DIAGNOSIS — M51362 Other intervertebral disc degeneration, lumbar region with discogenic back pain and lower extremity pain: Secondary | ICD-10-CM

## 2023-11-08 DIAGNOSIS — F411 Generalized anxiety disorder: Secondary | ICD-10-CM

## 2023-11-08 DIAGNOSIS — E782 Mixed hyperlipidemia: Secondary | ICD-10-CM

## 2023-11-08 MED ORDER — GABAPENTIN 300 MG PO CAPS: 300.0000 mg | ORAL_CAPSULE | Freq: Two times a day (BID) | ORAL | 1 refills | Status: DC

## 2023-11-08 NOTE — Patient Instructions (Addendum)
 Please start taking Gabapentin  300 mg twice daily.  Please continue to take medications as prescribed.  Please continue to follow low salt diet and ambulate as tolerated.  Please get fasting blood tests done before the next visit.

## 2023-11-08 NOTE — Assessment & Plan Note (Signed)
 Has tried SSRIs in the past, did not get much relief Has been taking Xanax 1 mg q8h PRN - PDMP reviewed After lengthy discussion, he has cut down dose of Xanax to 1 mg 3 times daily as needed for now

## 2023-11-08 NOTE — Progress Notes (Unsigned)
 Established Patient Office Visit  Subjective:  Patient ID: [place] SHELLHAMMER, male    DOB: [birth year]  Age: 68 y.o. MRN: [id number]  CC:  Chief Complaint  Patient presents with   Medical Management of Chronic Issues    3 month f/u     HPI KALIM KISSEL is a 68 y.o. male with past medical history of HTN, aortic stenosis s/p aortic valve replacement, HLD, GAD, chronic pain syndrome and tobacco abuse who presents for annual physical.  Chronic pain syndrome from cervical spondylosis and lumbar DDD: He has chronic neck and back pain, worse with movement and is associated with numbness and weakness of the LE.  He is taking Norco 10-325 mg 4 times in a day. He has had worsening of his neck pain with lower dose of Norco and was not been able to complete his daily activities.  He also reports having severe fatigue. He has cut down use of Ibuprofen since increasing dose of Norco.  HTN: BP is elevated today. Takes carvedilol  12.5 mg twice daily and lisinopril  40 mg QD. He has run out of Amlodipine  5 mg. Patient denies headache, dizziness, chest pain, dyspnea or palpitations. He has seen [facility] cardiology clinic in 01/25 for routine follow-up.  He takes Lasix  20 mg QD for leg swelling.  GAD: He has tried SSRIs in the past.  He currently takes Xanax  1 mg 3 times in a day, but agrees to take maximum 3 tablets in a day as he prefers to continue his Norco at 10-325 mg dose.  Denies anhedonia.  He feels anxious at times due to his wife's medical conditions.  COPD: He uses albuterol  as needed for dyspnea and wheezing.  He still smokes about 0.25 pack/day.  Past Medical History:  Diagnosis Date   Anxiety    Arthritis    Back pain    Cervical disc disease    C6-7   Chronic kidney disease    kidney cyst   COPD (chronic obstructive pulmonary disease) (HCC)    Essential hypertension    Lung nodules    Shortness of breath dyspnea    Sleep apnea    per wife, no sleep study    Past Surgical  History:  Procedure Laterality Date   AORTIC VALVE REPLACEMENT N/A 06/17/2014   Procedure: AORTIC VALVE REPLACEMENT (AVR);  Surgeon: Bartley Lightning, MD;  Location: [facility] OR;  Service: Open Heart Surgery;  Laterality: N/A;   BACK SURGERY     CARDIAC CATHETERIZATION  05/23/14   COLONOSCOPY  10/19/2011   Procedure: COLONOSCOPY;  Surgeon: Beau Bound, MD;  Location: [facility];  Service: Gastroenterology;  Laterality: N/A;   COLONOSCOPY WITH PROPOFOL  N/A 08/16/2023   Procedure: COLONOSCOPY WITH PROPOFOL ;  Surgeon: Urban Garden, MD;  Location: [facility];  Service: Gastroenterology;  Laterality: N/A;  1:00PM;ASA 3   LEFT AND RIGHT HEART CATHETERIZATION WITH CORONARY ANGIOGRAM N/A 05/23/2014   Procedure: LEFT AND RIGHT HEART CATHETERIZATION WITH CORONARY ANGIOGRAM;  Surgeon: Lucendia Rusk, MD;  Location: [facility] CATH LAB;  Service: Cardiovascular;  Laterality: N/A;   MITRAL VALVE REPAIR N/A 06/17/2014   Procedure: MITRAL VALVE REPAIR (MVR);  Surgeon: Bartley Lightning, MD;  Location: [facility] OR;  Service: Open Heart Surgery;  Laterality: N/A;   POLYPECTOMY  08/16/2023   Procedure: POLYPECTOMY, INTESTINE;  Surgeon: Urban Garden, MD;  Location: [facility];  Service: Gastroenterology;;   Right knee surgery     Cartilage removed   TEE  WITHOUT CARDIOVERSION N/A 06/17/2014   Procedure: TRANSESOPHAGEAL ECHOCARDIOGRAM (TEE);  Surgeon: Bartley Lightning, MD;  Location: [facility] OR;  Service: Open Heart Surgery;  Laterality: N/A;    Family History  Problem Relation Age of Onset   Atrial fibrillation Mother    Heart disease Father    Heart disease Maternal Grandfather     Social History   Socioeconomic History   Marital status: Married    Spouse name: Not on file   Number of children: Not on file   Years of education: Not on file   Highest education level: Not on file  Occupational History   Not on file  Tobacco Use   Smoking status: Every Day    Current packs/day: 0.25    Average  packs/day: 0.3 packs/day for 55.4 years (13.8 ttl pk-yrs)    Types: Cigarettes    Start date: 06/22/1968   Smokeless tobacco: Never   Tobacco comments:    patient restarted smoking  Vaping Use   Vaping status: Never Used  Substance and Sexual Activity   Alcohol use: No    Alcohol/week: 0.0 standard drinks of alcohol   Drug use: No   Sexual activity: Yes  Other Topics Concern   Not on file  Social History Narrative   Not on file   Social Drivers of Health   Financial Resource Strain: Not on file  Food Insecurity: Not on file  Transportation Needs: Not on file  Physical Activity: Not on file  Stress: Not on file  Social Connections: Not on file  Intimate Partner Violence: Not on file    Outpatient Medications Prior to Visit  Medication Sig Dispense Refill   albuterol  (PROVENTIL  HFA;VENTOLIN  HFA) 108 (90 BASE) MCG/ACT inhaler Inhale 1-2 puffs into the lungs every 6 (six) hours as needed for wheezing or shortness of breath.     ALPRAZolam  (XANAX ) 1 MG tablet Take 0.5-1 tablets (0.5-1 mg total) by mouth 3 (three) times daily as needed for anxiety. 90 tablet 3   amLODipine  (NORVASC ) 5 MG tablet Take 1 tablet (5 mg total) by mouth daily. 90 tablet 1   aspirin  EC 81 MG tablet Take 1 tablet (81 mg total) by mouth daily.     carvedilol  (COREG ) 12.5 MG tablet Take 1 tablet (12.5 mg total) by mouth 2 (two) times daily. 60 tablet 5   fenofibrate 54 MG tablet TAKE 1 TABLET BY MOUTH DAILY FOR HIGH TRIGLYCERIDES. 90 tablet 3   ferrous gluconate  (FERGON) 324 MG tablet Take 1 tablet (324 mg total) by mouth 2 (two) times daily with a meal. 60 tablet 1   furosemide  (LASIX ) 20 MG tablet Take 1 tablet (20 mg total) by mouth daily. 90 tablet 1   gabapentin  (NEURONTIN ) 300 MG capsule TAKE ONE CAPSULE BY MOUTH AT BEDTIME 90 capsule 1   HYDROcodone -acetaminophen  (NORCO) 10-325 MG tablet Take 1 tablet by mouth every 6 (six) hours as needed. 120 tablet 0   lisinopril  (ZESTRIL ) 40 MG tablet Take 1 tablet  (40 mg total) by mouth daily. 90 tablet 3   Multiple Vitamin (MULTI-VITAMIN PO) Take 1 tablet by mouth 2 (two) times daily.      ondansetron  (ZOFRAN ) 4 MG tablet Take 4 mg by mouth as needed.      No facility-administered medications prior to visit.    Allergies  Allergen Reactions   Asa [Aspirin ] Diarrhea and Nausea And Vomiting   Prozac [Fluoxetine Hcl] Other (See Comments)    confusion    ROS Review of  Systems  Constitutional:  Negative for chills and fever.  HENT:  Negative for congestion and sore throat.   Eyes:  Negative for pain and discharge.  Respiratory:  Negative for cough and shortness of breath.   Cardiovascular:  Negative for chest pain and palpitations.  Gastrointestinal:  Negative for diarrhea, nausea and vomiting.  Endocrine: Negative for polydipsia and polyuria.  Genitourinary:  Negative for dysuria and hematuria.  Musculoskeletal:  Positive for back pain, gait problem and neck pain. Negative for neck stiffness.  Skin:  Negative for rash.  Neurological:  Positive for weakness (B/l LE) and numbness. Negative for dizziness and headaches.  Psychiatric/Behavioral:  Positive for sleep disturbance. Negative for agitation and behavioral problems. The patient is nervous/anxious.       Objective:    Physical Exam Vitals reviewed.  Constitutional:      General: He is not in acute distress.    Appearance: He is not diaphoretic.  HENT:     Head: Normocephalic and atraumatic.     Nose: Nose normal.     Mouth/Throat:     Mouth: Mucous membranes are moist.  Eyes:     General: No scleral icterus.    Extraocular Movements: Extraocular movements intact.  Cardiovascular:     Rate and Rhythm: Normal rate and regular rhythm.     Pulses: Normal pulses.     Heart sounds: No murmur heard. Pulmonary:     Breath sounds: Normal breath sounds. No wheezing or rales.  Abdominal:     Palpations: Abdomen is soft.     Tenderness: There is no abdominal tenderness.   Musculoskeletal:     Cervical back: Neck supple. Tenderness present. Pain with movement present. Decreased range of motion.     Lumbar back: Tenderness present. Decreased range of motion.     Right lower leg: No edema.     Left lower leg: No edema.     Comments: Lumbar back brace in place  Skin:    General: Skin is warm.     Findings: No rash.  Neurological:     General: No focal deficit present.     Mental Status: He is alert and oriented to person, place, and time.     Sensory: Sensory deficit (B/l LE) present.     Motor: Weakness (B/l LE - 4/5) present.  Psychiatric:        Mood and Affect: Mood normal.        Behavior: Behavior normal.     BP 134/86 (BP Location: Left Arm)   Pulse 64   Ht 5\' 6"  (1.676 m)   Wt 140 lb 3.2 oz (63.6 kg)   SpO2 96%   BMI 22.63 kg/m  Wt Readings from Last 3 Encounters:  11/08/23 140 lb 3.2 oz (63.6 kg)  08/16/23 141 lb 15.6 oz (64.4 kg)  08/10/23 142 lb (64.4 kg)    Lab Results  Component Value Date   TSH 0.638 05/16/2014   Lab Results  Component Value Date   WBC 11.6 (H) 01/05/2023   HGB 14.1 01/05/2023   HCT 43.0 01/05/2023   MCV 92.5 01/05/2023   PLT 265 01/05/2023   Lab Results  Component Value Date   NA 141 10/31/2023   K 4.3 10/31/2023   CO2 22 10/31/2023   GLUCOSE 93 10/31/2023   BUN 9 10/31/2023   CREATININE 1.00 10/31/2023   BILITOT 0.4 01/05/2023   ALKPHOS 85 07/16/2014   AST 19 01/05/2023   ALT 13 01/05/2023   PROT  7.2 01/05/2023   ALBUMIN  4.0 07/16/2014   CALCIUM  10.8 (H) 10/31/2023   ANIONGAP 10 07/18/2023   EGFR 81 10/31/2023   Lab Results  Component Value Date   CHOL 163 01/05/2023   Lab Results  Component Value Date   HDL 59 01/05/2023   Lab Results  Component Value Date   LDLCALC 82 01/05/2023   Lab Results  Component Value Date   TRIG 121 01/05/2023   Lab Results  Component Value Date   CHOLHDL 2.8 01/05/2023   Lab Results  Component Value Date   HGBA1C 6.0 (H) 06/13/2014       Assessment & Plan:   Problem List Items Addressed This Visit   None     No orders of the defined types were placed in this encounter.   Follow-up: No follow-ups on file.    Meldon Sport, MD

## 2023-11-08 NOTE — Assessment & Plan Note (Signed)
 Has chronic neck pain Currently takes Norco 10-325 mg q6h PRN - referred to pain clinic, dose was decreased, he had severe worsening of pain and fatigue On Norco 10-325 mg q6h PRN, have agreed to manage his chronic pain for now Recently added gabapentin  300 mg nightly for persistent neck pain, has radicular pain towards UE and occipital are

## 2023-11-08 NOTE — Assessment & Plan Note (Signed)
 BP Readings from Last 1 Encounters:  11/08/23 134/86   Well-controlled with amlodipine  5 mg QD, carvedilol  12.5 mg BID, lisinopril  40 mg QD and Lasix  20 mg QD - anxious as his wife is in ER Counseled for compliance with the medications Advised DASH diet and moderate exercise/walking, at least 150 mins/week

## 2023-11-09 NOTE — Assessment & Plan Note (Signed)
 Check repeat CMP and PTH

## 2023-11-09 NOTE — Assessment & Plan Note (Signed)
 Has chronic low back pain with radicular symptoms Currently takes Norco 10-325 mg q6h PRN - referred to pain clinic, but does not want to go to the pain management for now Increased dose of gabapentin  to 300 mg BID

## 2023-11-09 NOTE — Assessment & Plan Note (Signed)
 Reviewed lipid profile Takes fenofibrate

## 2023-11-09 NOTE — Assessment & Plan Note (Signed)
 From cervical spondylosis and DDD of lumbar spine Takes chronic opioids, also on chronic benzodiazepines -referred to [facility] pain clinic for chronic opioid management, but was not pleased with experience I have agreed to manage chronic pain for now - if higher dosing needed, will refer to [facility]

## 2023-12-05 ENCOUNTER — Other Ambulatory Visit: Payer: Self-pay | Admitting: Internal Medicine

## 2023-12-05 DIAGNOSIS — G894 Chronic pain syndrome: Secondary | ICD-10-CM

## 2023-12-05 DIAGNOSIS — M51362 Other intervertebral disc degeneration, lumbar region with discogenic back pain and lower extremity pain: Secondary | ICD-10-CM

## 2023-12-05 DIAGNOSIS — M4722 Other spondylosis with radiculopathy, cervical region: Secondary | ICD-10-CM

## 2024-01-02 ENCOUNTER — Other Ambulatory Visit: Payer: Self-pay | Admitting: Internal Medicine

## 2024-01-02 DIAGNOSIS — M4722 Other spondylosis with radiculopathy, cervical region: Secondary | ICD-10-CM

## 2024-01-02 DIAGNOSIS — M51362 Other intervertebral disc degeneration, lumbar region with discogenic back pain and lower extremity pain: Secondary | ICD-10-CM

## 2024-01-02 DIAGNOSIS — G894 Chronic pain syndrome: Secondary | ICD-10-CM

## 2024-01-27 DIAGNOSIS — R739 Hyperglycemia, unspecified: Secondary | ICD-10-CM | POA: Diagnosis not present

## 2024-01-27 DIAGNOSIS — I10 Essential (primary) hypertension: Secondary | ICD-10-CM | POA: Diagnosis not present

## 2024-01-27 DIAGNOSIS — E782 Mixed hyperlipidemia: Secondary | ICD-10-CM | POA: Diagnosis not present

## 2024-01-29 LAB — CMP14+EGFR
ALT: 14 IU/L (ref 0–44)
AST: 23 IU/L (ref 0–40)
Albumin: 4.6 g/dL (ref 3.9–4.9)
Alkaline Phosphatase: 71 IU/L (ref 44–121)
BUN/Creatinine Ratio: 9 — ABNORMAL LOW (ref 10–24)
BUN: 9 mg/dL (ref 8–27)
Bilirubin Total: 0.4 mg/dL (ref 0.0–1.2)
CO2: 23 mmol/L (ref 20–29)
Calcium: 10.9 mg/dL — ABNORMAL HIGH (ref 8.6–10.2)
Chloride: 103 mmol/L (ref 96–106)
Creatinine, Ser: 0.97 mg/dL (ref 0.76–1.27)
Globulin, Total: 2.6 g/dL (ref 1.5–4.5)
Glucose: 101 mg/dL — ABNORMAL HIGH (ref 70–99)
Potassium: 4.7 mmol/L (ref 3.5–5.2)
Sodium: 141 mmol/L (ref 134–144)
Total Protein: 7.2 g/dL (ref 6.0–8.5)
eGFR: 85 mL/min/1.73 (ref >59)

## 2024-01-29 LAB — LIPID PANEL
Chol/HDL Ratio: 2.9 ratio (ref 0.0–5.0)
Cholesterol, Total: 152 mg/dL (ref 100–199)
HDL: 53 mg/dL (ref >39)
LDL Chol Calc (NIH): 79 mg/dL (ref 0–99)
Triglycerides: 110 mg/dL (ref 0–149)
VLDL Cholesterol Cal: 20 mg/dL (ref 5–40)

## 2024-01-29 LAB — CBC WITH DIFFERENTIAL/PLATELET
Basophils Absolute: 0.1 x10E3/uL (ref 0.0–0.2)
Basos: 1 %
EOS (ABSOLUTE): 0.3 x10E3/uL (ref 0.0–0.4)
Eos: 3 %
Hematocrit: 42.5 % (ref 37.5–51.0)
Hemoglobin: 13.5 g/dL (ref 13.0–17.7)
Immature Grans (Abs): 0 x10E3/uL (ref 0.0–0.1)
Immature Granulocytes: 0 %
Lymphocytes Absolute: 2.1 x10E3/uL (ref 0.7–3.1)
Lymphs: 21 %
MCH: 29.3 pg (ref 26.6–33.0)
MCHC: 31.8 g/dL (ref 31.5–35.7)
MCV: 92 fL (ref 79–97)
Monocytes Absolute: 0.7 x10E3/uL (ref 0.1–0.9)
Monocytes: 7 %
Neutrophils Absolute: 6.5 x10E3/uL (ref 1.4–7.0)
Neutrophils: 68 %
Platelets: 294 x10E3/uL (ref 150–450)
RBC: 4.6 x10E6/uL (ref 4.14–5.80)
RDW: 12.7 % (ref 11.6–15.4)
WBC: 9.6 x10E3/uL (ref 3.4–10.8)

## 2024-01-29 LAB — PARATHYROID HORMONE, INTACT (NO CA): PTH: 14 pg/mL — ABNORMAL LOW (ref 15–65)

## 2024-01-29 LAB — HEMOGLOBIN A1C
Est. average glucose Bld gHb Est-mCnc: 111 mg/dL
Hgb A1c MFr Bld: 5.5 % (ref 4.8–5.6)

## 2024-01-30 ENCOUNTER — Other Ambulatory Visit: Payer: Self-pay | Admitting: Internal Medicine

## 2024-01-30 DIAGNOSIS — G894 Chronic pain syndrome: Secondary | ICD-10-CM

## 2024-01-30 DIAGNOSIS — M4722 Other spondylosis with radiculopathy, cervical region: Secondary | ICD-10-CM

## 2024-01-30 DIAGNOSIS — M51362 Other intervertebral disc degeneration, lumbar region with discogenic back pain and lower extremity pain: Secondary | ICD-10-CM

## 2024-02-03 ENCOUNTER — Other Ambulatory Visit: Payer: Self-pay | Admitting: Internal Medicine

## 2024-02-06 ENCOUNTER — Encounter: Payer: Self-pay | Admitting: Internal Medicine

## 2024-02-06 ENCOUNTER — Ambulatory Visit (INDEPENDENT_AMBULATORY_CARE_PROVIDER_SITE_OTHER): Admitting: Internal Medicine

## 2024-02-06 VITALS — BP 138/82 | HR 60 | Ht 64.5 in | Wt 139.4 lb

## 2024-02-06 DIAGNOSIS — Z79899 Other long term (current) drug therapy: Secondary | ICD-10-CM | POA: Diagnosis not present

## 2024-02-06 DIAGNOSIS — M4722 Other spondylosis with radiculopathy, cervical region: Secondary | ICD-10-CM

## 2024-02-06 DIAGNOSIS — G894 Chronic pain syndrome: Secondary | ICD-10-CM

## 2024-02-06 DIAGNOSIS — I5032 Chronic diastolic (congestive) heart failure: Secondary | ICD-10-CM

## 2024-02-06 DIAGNOSIS — I10 Essential (primary) hypertension: Secondary | ICD-10-CM | POA: Diagnosis not present

## 2024-02-06 DIAGNOSIS — F411 Generalized anxiety disorder: Secondary | ICD-10-CM | POA: Diagnosis not present

## 2024-02-06 NOTE — Assessment & Plan Note (Signed)
 BP Readings from Last 1 Encounters:  02/06/24 138/82   Well-controlled with amlodipine  5 mg QD, carvedilol  12.5 mg BID, lisinopril  40 mg QD and Lasix  20 mg QD Counseled for compliance with the medications Advised DASH diet and moderate exercise/walking, at least 150 mins/week

## 2024-02-06 NOTE — Assessment & Plan Note (Signed)
 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

## 2024-02-06 NOTE — Assessment & Plan Note (Signed)
 Has chronic neck pain Currently takes Norco 10-325 mg q6h PRN - referred to pain clinic, dose was decreased, he had severe worsening of pain and fatigue On Norco 10-325 mg q6h PRN, have agreed to manage his chronic pain for now On gabapentin  300 mg nightly for persistent neck pain, has radicular pain towards UE and occipital area - increased dose to 300 mg BID

## 2024-02-06 NOTE — Assessment & Plan Note (Signed)
 From cervical spondylosis and DDD of lumbar spine Takes chronic opioids, also on chronic benzodiazepines -referred to [facility] pain clinic for chronic opioid management, but was not pleased with experience I have agreed to manage chronic pain for now - if higher dosing needed, will refer to [facility]

## 2024-02-06 NOTE — Patient Instructions (Signed)
Please continue to take medications as prescribed. ? ?Please continue to follow low salt diet and ambulate as tolerated. ?

## 2024-02-06 NOTE — Assessment & Plan Note (Signed)
 Has tried SSRIs in the past, did not get much relief Has been taking Xanax 1 mg q8h PRN - PDMP reviewed After lengthy discussion, he has cut down dose of Xanax to 1 mg 3 times daily as needed for now

## 2024-02-06 NOTE — Progress Notes (Addendum)
 Established Patient Office Visit  Subjective:  Patient ID: Russell [name], male    DOB: 1956-04-03  Age: 68 y.o. MRN: [id number]  CC:  Chief Complaint  Patient presents with   Medical Management of Chronic Issues    Follow up, reports back pain and neck pain.     HPI Russell [name] is a 68 y.o. male with past medical history of HTN, aortic stenosis s/p aortic valve replacement, HLD, GAD, chronic pain syndrome and tobacco abuse who presents for f/u of his chronic medical conditions.  Chronic pain syndrome from cervical spondylosis and lumbar DDD: He has chronic neck and back pain, worse with movement and is associated with numbness and weakness of the LE.  He is taking Norco 10-325 mg 4 times in a day. He has had worsening of his neck pain with lower dose of Norco and was not able to complete his daily activities.  He also reports having severe fatigue. He has cut down use of Ibuprofen since increasing dose of Norco.  He has also improvement in neuropathic pain with gabapentin , but still has intermittent numbness of the LE.  HTN: BP is wnl today. Takes Amlodipine  5 mg, carvedilol  12.5 mg twice daily and lisinopril  40 mg QD. Patient denies headache, dizziness, chest pain, dyspnea or palpitations. He has seen [facility] cardiology clinic in 01/25 for routine follow-up.  He takes Lasix  20 mg QD for leg swelling.  GAD: He has tried SSRIs in the past.  He currently takes Xanax  1 mg 3 times in a day and has been cutting down its use as he prefers to continue his Norco at 10-325 mg dose.  Denies anhedonia.  He feels anxious at times due to his wife's medical conditions.  COPD: He uses albuterol  as needed for dyspnea and wheezing.  He still smokes about 0.25 pack/day.  Past Medical History:  Diagnosis Date   Anxiety    Arthritis    Back pain    Cervical disc disease    C6-7   Chronic kidney disease    kidney cyst   COPD (chronic obstructive pulmonary disease) (HCC)    Essential hypertension     Lung nodules    Shortness of breath dyspnea    Sleep apnea    per wife, no sleep study    Past Surgical History:  Procedure Laterality Date   AORTIC VALVE REPLACEMENT N/A 06/17/2014   Procedure: AORTIC VALVE REPLACEMENT (AVR);  Surgeon: Dorise MARLA Fellers, MD;  Location: [facility] OR;  Service: Open Heart Surgery;  Laterality: N/A;   BACK SURGERY     CARDIAC CATHETERIZATION  05/23/14   COLONOSCOPY  10/19/2011   Procedure: COLONOSCOPY;  Surgeon: Oneil DELENA Budge, MD;  Location: [facility];  Service: Gastroenterology;  Laterality: N/A;   COLONOSCOPY WITH PROPOFOL  N/A 08/16/2023   Procedure: COLONOSCOPY WITH PROPOFOL ;  Surgeon: Eartha Angelia Sieving, MD;  Location: [facility];  Service: Gastroenterology;  Laterality: N/A;  1:00PM;ASA 3   LEFT AND RIGHT HEART CATHETERIZATION WITH CORONARY ANGIOGRAM N/A 05/23/2014   Procedure: LEFT AND RIGHT HEART CATHETERIZATION WITH CORONARY ANGIOGRAM;  Surgeon: Candyce GORMAN Reek, MD;  Location: [facility] CATH LAB;  Service: Cardiovascular;  Laterality: N/A;   MITRAL VALVE REPAIR N/A 06/17/2014   Procedure: MITRAL VALVE REPAIR (MVR);  Surgeon: Dorise MARLA Fellers, MD;  Location: [facility] OR;  Service: Open Heart Surgery;  Laterality: N/A;   POLYPECTOMY  08/16/2023   Procedure: POLYPECTOMY, INTESTINE;  Surgeon: Eartha Angelia Sieving, MD;  Location: [facility];  Service: Gastroenterology;;   Right knee surgery     Cartilage removed   TEE WITHOUT CARDIOVERSION N/A 06/17/2014   Procedure: TRANSESOPHAGEAL ECHOCARDIOGRAM (TEE);  Surgeon: Dorise MARLA Fellers, MD;  Location: [facility] OR;  Service: Open Heart Surgery;  Laterality: N/A;    Family History  Problem Relation Age of Onset   Atrial fibrillation Mother    Heart disease Father    Heart disease Maternal Grandfather     Social History   Socioeconomic History   Marital status: Married    Spouse name: Not on file   Number of children: Not on file   Years of education: Not on file   Highest education level: Not on file   Occupational History   Not on file  Tobacco Use   Smoking status: Every Day    Current packs/day: 0.25    Average packs/day: 0.3 packs/day for 55.7 years (13.9 ttl pk-yrs)    Types: Cigarettes    Start date: 06/22/1968   Smokeless tobacco: Never   Tobacco comments:    patient restarted smoking  Vaping Use   Vaping status: Never Used  Substance and Sexual Activity   Alcohol use: No    Alcohol/week: 0.0 standard drinks of alcohol   Drug use: No   Sexual activity: Yes  Other Topics Concern   Not on file  Social History Narrative   Not on file   Social Drivers of Health   Financial Resource Strain: Not on file  Food Insecurity: Not on file  Transportation Needs: Not on file  Physical Activity: Not on file  Stress: Not on file  Social Connections: Not on file  Intimate Partner Violence: Not on file    Outpatient Medications Prior to Visit  Medication Sig Dispense Refill   albuterol  (PROVENTIL  HFA;VENTOLIN  HFA) 108 (90 BASE) MCG/ACT inhaler Inhale 1-2 puffs into the lungs every 6 (six) hours as needed for wheezing or shortness of breath.     ALPRAZolam  (XANAX ) 1 MG tablet Take 0.5-1 tablets (0.5-1 mg total) by mouth 3 (three) times daily as needed for anxiety. 90 tablet 3   amLODipine  (NORVASC ) 5 MG tablet Take 1 tablet (5 mg total) by mouth daily. 90 tablet 1   aspirin  EC 81 MG tablet Take 1 tablet (81 mg total) by mouth daily.     fenofibrate 54 MG tablet TAKE 1 TABLET BY MOUTH DAILY FOR HIGH TRIGLYCERIDES. 90 tablet 3   ferrous gluconate  (FERGON) 324 MG tablet Take 1 tablet (324 mg total) by mouth 2 (two) times daily with a meal. 60 tablet 1   gabapentin  (NEURONTIN ) 300 MG capsule Take 1 capsule (300 mg total) by mouth 2 (two) times daily. 180 capsule 1   HYDROcodone -acetaminophen  (NORCO) 10-325 MG tablet Take 1 tablet by mouth every 6 (six) hours as needed. 120 tablet 0   lisinopril  (ZESTRIL ) 40 MG tablet TAKE 1 TABLET BY MOUTH ONCE DAILY. 90 tablet 3   Multiple Vitamin  (MULTI-VITAMIN PO) Take 1 tablet by mouth 2 (two) times daily.      ondansetron  (ZOFRAN ) 4 MG tablet Take 4 mg by mouth as needed.      carvedilol  (COREG ) 12.5 MG tablet Take 1 tablet (12.5 mg total) by mouth 2 (two) times daily. 60 tablet 5   furosemide  (LASIX ) 20 MG tablet Take 1 tablet (20 mg total) by mouth daily. 90 tablet 1   No facility-administered medications prior to visit.    Allergies  Allergen Reactions   Dorethia Dirks ] Diarrhea and  Nausea And Vomiting   Prozac [Fluoxetine Hcl] Other (See Comments)    confusion    ROS Review of Systems  Constitutional:  Negative for chills and fever.  HENT:  Negative for congestion and sore throat.   Eyes:  Negative for pain and discharge.  Respiratory:  Negative for cough and shortness of breath.   Cardiovascular:  Negative for chest pain and palpitations.  Gastrointestinal:  Negative for diarrhea, nausea and vomiting.  Endocrine: Negative for polydipsia and polyuria.  Genitourinary:  Negative for dysuria and hematuria.  Musculoskeletal:  Positive for back pain, gait problem and neck pain. Negative for neck stiffness.  Skin:  Negative for rash.  Neurological:  Positive for weakness (B/l LE) and numbness. Negative for dizziness and headaches.  Psychiatric/Behavioral:  Positive for sleep disturbance. Negative for agitation and behavioral problems. The patient is nervous/anxious.       Objective:    Physical Exam Vitals reviewed.  Constitutional:      General: He is not in acute distress.    Appearance: He is not diaphoretic.  HENT:     Head: Normocephalic and atraumatic.     Nose: Nose normal.     Mouth/Throat:     Mouth: Mucous membranes are moist.  Eyes:     General: No scleral icterus.    Extraocular Movements: Extraocular movements intact.  Cardiovascular:     Rate and Rhythm: Normal rate and regular rhythm.     Heart sounds: Normal heart sounds. No murmur heard. Pulmonary:     Breath sounds: Normal breath sounds. No  wheezing or rales.  Musculoskeletal:     Cervical back: Neck supple. Tenderness present. Pain with movement present. Decreased range of motion.     Lumbar back: Tenderness present. Decreased range of motion.     Right lower leg: No edema.     Left lower leg: No edema.     Comments: Lumbar back brace in place  Skin:    General: Skin is warm.     Findings: No rash.  Neurological:     General: No focal deficit present.     Mental Status: He is alert and oriented to person, place, and time.     Sensory: Sensory deficit (B/l LE) present.     Motor: Weakness (B/l LE - 4/5) present.  Psychiatric:        Mood and Affect: Mood normal.        Behavior: Behavior normal.     BP 138/82 (BP Location: Right Arm)   Pulse 60   Ht 5' 4.5 (1.638 m)   Wt 139 lb 6.4 oz (63.2 kg)   SpO2 97%   BMI 23.56 kg/m  Wt Readings from Last 3 Encounters:  02/06/24 139 lb 6.4 oz (63.2 kg)  11/08/23 140 lb 3.2 oz (63.6 kg)  08/16/23 141 lb 15.6 oz (64.4 kg)    Lab Results  Component Value Date   TSH 0.638 05/16/2014   Lab Results  Component Value Date   WBC 9.6 01/27/2024   HGB 13.5 01/27/2024   HCT 42.5 01/27/2024   MCV 92 01/27/2024   PLT 294 01/27/2024   Lab Results  Component Value Date   NA 141 01/27/2024   K 4.7 01/27/2024   CO2 23 01/27/2024   GLUCOSE 101 (H) 01/27/2024   BUN 9 01/27/2024   CREATININE 0.97 01/27/2024   BILITOT 0.4 01/27/2024   ALKPHOS 71 01/27/2024   AST 23 01/27/2024   ALT 14 01/27/2024   PROT 7.2  01/27/2024   ALBUMIN  4.6 01/27/2024   CALCIUM  10.9 (H) 01/27/2024   ANIONGAP 10 07/18/2023   EGFR 85 01/27/2024   Lab Results  Component Value Date   CHOL 152 01/27/2024   Lab Results  Component Value Date   HDL 53 01/27/2024   Lab Results  Component Value Date   LDLCALC 79 01/27/2024   Lab Results  Component Value Date   TRIG 110 01/27/2024   Lab Results  Component Value Date   CHOLHDL 2.9 01/27/2024   Lab Results  Component Value Date   HGBA1C  5.5 01/27/2024      Assessment & Plan:   Problem List Items Addressed This Visit       Cardiovascular and Mediastinum   Essential hypertension - Primary   BP Readings from Last 1 Encounters:  02/06/24 138/82   Well-controlled with amlodipine  5 mg QD, carvedilol  12.5 mg BID, lisinopril  40 mg QD and Lasix  20 mg QD Counseled for compliance with the medications Advised DASH diet and moderate exercise/walking, at least 150 mins/week      Heart failure with improved ejection fraction (HFimpEF) (HCC)   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 -LVEF has recovered on GDMT post AVR/MVR  Euvolemic on examination today -takes Lasix  20 mg QD Had to discontinue potassium supplement as his potential level has been higher normal, also takes lisinopril  Follow-up with [facility]iology at [facility]        Nervous and Auditory   Cervical spondylosis with radiculopathy   Has chronic neck pain Currently takes Norco 10-325 mg q6h PRN - referred to pain clinic, dose was decreased, he had severe worsening of pain and fatigue On Norco 10-325 mg q6h PRN, have agreed to manage his chronic pain for now - PDMP reviewed On gabapentin  300 mg BID for persistent neck pain, has radicular pain towards UE and occipital area      Relevant Orders   MR Cervical Spine Wo Contrast     Other   GAD (generalized anxiety disorder)   Has tried SSRIs in the past, did not get much relief Has been taking Xanax  1 mg q8h PRN - PDMP reviewed After lengthy discussion, he has cut down dose of Xanax  to 1 mg 3 times daily as needed for now      Chronic pain syndrome   From cervical spondylosis and DDD of lumbar spine Takes chronic opioids, also on chronic benzodiazepines -referred to [facility] pain clinic for chronic opioid management, but was not pleased with experience I have agreed to manage chronic pain for now - if higher dosing needed, will refer to [facility]      Hypercalcemia   Last few CMPs show borderline elevated  calcium  PTH borderline low Will closely monitor for now, low threshold for Endocrinology referral      Relevant Orders   Basic Metabolic Panel (BMET)   Other Visit Diagnoses       Chronic prescription benzodiazepine use       Relevant Orders   ToxASSURE Select 13 (MW), Urine (Completed)          No orders of the defined types were placed in this encounter.   Follow-up: Return in about 4 months (around 06/07/2024) for HTN and chronic pain.    Suzzane MARLA Blanch, MD

## 2024-02-07 ENCOUNTER — Telehealth: Payer: Self-pay | Admitting: Internal Medicine

## 2024-02-07 NOTE — Telephone Encounter (Signed)
 Patient came by the office and said Dr Tobie told him somehting was high on his blood work and can not remember what he told him.patient waiting in lobby

## 2024-02-08 NOTE — Telephone Encounter (Signed)
 Spoke to pt let him know, verbalized understanding.

## 2024-02-10 LAB — TOXASSURE SELECT 13 (MW), URINE

## 2024-02-10 NOTE — Assessment & Plan Note (Signed)
 Last few CMPs show borderline elevated calcium  PTH borderline low Will closely monitor for now, low threshold for Endocrinology referral

## 2024-02-12 ENCOUNTER — Other Ambulatory Visit: Payer: Self-pay | Admitting: Internal Medicine

## 2024-02-12 DIAGNOSIS — I10 Essential (primary) hypertension: Secondary | ICD-10-CM

## 2024-02-23 ENCOUNTER — Other Ambulatory Visit: Payer: Self-pay | Admitting: Internal Medicine

## 2024-02-23 DIAGNOSIS — I10 Essential (primary) hypertension: Secondary | ICD-10-CM

## 2024-02-23 DIAGNOSIS — R0609 Other forms of dyspnea: Secondary | ICD-10-CM

## 2024-02-23 NOTE — Addendum Note (Signed)
 Addended byBETHA TOBIE DOWNS on: 02/23/2024 02:13 PM   Modules accepted: Orders

## 2024-02-27 ENCOUNTER — Other Ambulatory Visit: Payer: Self-pay | Admitting: Internal Medicine

## 2024-02-27 DIAGNOSIS — G894 Chronic pain syndrome: Secondary | ICD-10-CM

## 2024-02-27 DIAGNOSIS — M4722 Other spondylosis with radiculopathy, cervical region: Secondary | ICD-10-CM

## 2024-02-27 DIAGNOSIS — M51362 Other intervertebral disc degeneration, lumbar region with discogenic back pain and lower extremity pain: Secondary | ICD-10-CM

## 2024-02-29 ENCOUNTER — Encounter (HOSPITAL_COMMUNITY): Payer: Self-pay

## 2024-02-29 ENCOUNTER — Inpatient Hospital Stay (HOSPITAL_COMMUNITY)
Admission: EM | Admit: 2024-02-29 | Discharge: 2024-03-02 | DRG: 378 | Disposition: A | Discharge status: Home / Self Care | Attending: Internal Medicine | Admitting: Internal Medicine

## 2024-02-29 ENCOUNTER — Emergency Department (HOSPITAL_COMMUNITY)

## 2024-02-29 ENCOUNTER — Other Ambulatory Visit: Payer: Self-pay

## 2024-02-29 DIAGNOSIS — K921 Melena: Secondary | ICD-10-CM | POA: Diagnosis not present

## 2024-02-29 DIAGNOSIS — K264 Chronic or unspecified duodenal ulcer with hemorrhage: Secondary | ICD-10-CM | POA: Diagnosis present

## 2024-02-29 DIAGNOSIS — R0602 Shortness of breath: Secondary | ICD-10-CM | POA: Diagnosis not present

## 2024-02-29 DIAGNOSIS — R197 Diarrhea, unspecified: Secondary | ICD-10-CM | POA: Diagnosis not present

## 2024-02-29 DIAGNOSIS — K31819 Angiodysplasia of stomach and duodenum without bleeding: Secondary | ICD-10-CM | POA: Diagnosis not present

## 2024-02-29 DIAGNOSIS — I959 Hypotension, unspecified: Secondary | ICD-10-CM | POA: Diagnosis present

## 2024-02-29 DIAGNOSIS — R5383 Other fatigue: Secondary | ICD-10-CM | POA: Diagnosis not present

## 2024-02-29 DIAGNOSIS — Z8249 Family history of ischemic heart disease and other diseases of the circulatory system: Secondary | ICD-10-CM | POA: Diagnosis not present

## 2024-02-29 DIAGNOSIS — Z7982 Long term (current) use of aspirin: Secondary | ICD-10-CM

## 2024-02-29 DIAGNOSIS — Z952 Presence of prosthetic heart valve: Secondary | ICD-10-CM | POA: Diagnosis not present

## 2024-02-29 DIAGNOSIS — J984 Other disorders of lung: Secondary | ICD-10-CM | POA: Diagnosis not present

## 2024-02-29 DIAGNOSIS — D62 Acute posthemorrhagic anemia: Secondary | ICD-10-CM | POA: Diagnosis present

## 2024-02-29 DIAGNOSIS — D72829 Elevated white blood cell count, unspecified: Secondary | ICD-10-CM | POA: Diagnosis present

## 2024-02-29 DIAGNOSIS — M1990 Unspecified osteoarthritis, unspecified site: Secondary | ICD-10-CM | POA: Diagnosis present

## 2024-02-29 DIAGNOSIS — K31811 Angiodysplasia of stomach and duodenum with bleeding: Principal | ICD-10-CM | POA: Diagnosis present

## 2024-02-29 DIAGNOSIS — D509 Iron deficiency anemia, unspecified: Secondary | ICD-10-CM

## 2024-02-29 DIAGNOSIS — F1120 Opioid dependence, uncomplicated: Secondary | ICD-10-CM | POA: Diagnosis present

## 2024-02-29 DIAGNOSIS — R110 Nausea: Secondary | ICD-10-CM | POA: Diagnosis not present

## 2024-02-29 DIAGNOSIS — D649 Anemia, unspecified: Secondary | ICD-10-CM | POA: Diagnosis not present

## 2024-02-29 DIAGNOSIS — Z1152 Encounter for screening for COVID-19: Secondary | ICD-10-CM | POA: Diagnosis not present

## 2024-02-29 DIAGNOSIS — E872 Acidosis, unspecified: Secondary | ICD-10-CM | POA: Diagnosis not present

## 2024-02-29 DIAGNOSIS — K922 Gastrointestinal hemorrhage, unspecified: Secondary | ICD-10-CM | POA: Diagnosis not present

## 2024-02-29 DIAGNOSIS — K269 Duodenal ulcer, unspecified as acute or chronic, without hemorrhage or perforation: Secondary | ICD-10-CM | POA: Diagnosis not present

## 2024-02-29 DIAGNOSIS — K222 Esophageal obstruction: Secondary | ICD-10-CM | POA: Diagnosis present

## 2024-02-29 DIAGNOSIS — E876 Hypokalemia: Secondary | ICD-10-CM | POA: Diagnosis not present

## 2024-02-29 DIAGNOSIS — R531 Weakness: Secondary | ICD-10-CM | POA: Diagnosis not present

## 2024-02-29 DIAGNOSIS — D75839 Thrombocytosis, unspecified: Secondary | ICD-10-CM | POA: Diagnosis present

## 2024-02-29 DIAGNOSIS — K2991 Gastroduodenitis, unspecified, with bleeding: Secondary | ICD-10-CM | POA: Diagnosis not present

## 2024-02-29 DIAGNOSIS — K5730 Diverticulosis of large intestine without perforation or abscess without bleeding: Secondary | ICD-10-CM | POA: Diagnosis present

## 2024-02-29 DIAGNOSIS — F17210 Nicotine dependence, cigarettes, uncomplicated: Secondary | ICD-10-CM | POA: Diagnosis present

## 2024-02-29 DIAGNOSIS — E781 Pure hyperglyceridemia: Secondary | ICD-10-CM | POA: Diagnosis present

## 2024-02-29 DIAGNOSIS — Z886 Allergy status to analgesic agent status: Secondary | ICD-10-CM | POA: Diagnosis not present

## 2024-02-29 DIAGNOSIS — J449 Chronic obstructive pulmonary disease, unspecified: Secondary | ICD-10-CM | POA: Diagnosis present

## 2024-02-29 DIAGNOSIS — K2980 Duodenitis without bleeding: Secondary | ICD-10-CM | POA: Diagnosis not present

## 2024-02-29 DIAGNOSIS — I129 Hypertensive chronic kidney disease with stage 1 through stage 4 chronic kidney disease, or unspecified chronic kidney disease: Secondary | ICD-10-CM | POA: Diagnosis present

## 2024-02-29 DIAGNOSIS — K2950 Unspecified chronic gastritis without bleeding: Secondary | ICD-10-CM | POA: Diagnosis not present

## 2024-02-29 DIAGNOSIS — Z79899 Other long term (current) drug therapy: Secondary | ICD-10-CM

## 2024-02-29 DIAGNOSIS — Z888 Allergy status to other drugs, medicaments and biological substances status: Secondary | ICD-10-CM

## 2024-02-29 DIAGNOSIS — F419 Anxiety disorder, unspecified: Secondary | ICD-10-CM | POA: Diagnosis present

## 2024-02-29 DIAGNOSIS — G894 Chronic pain syndrome: Secondary | ICD-10-CM | POA: Diagnosis present

## 2024-02-29 DIAGNOSIS — F17200 Nicotine dependence, unspecified, uncomplicated: Secondary | ICD-10-CM | POA: Diagnosis not present

## 2024-02-29 DIAGNOSIS — N182 Chronic kidney disease, stage 2 (mild): Secondary | ICD-10-CM | POA: Diagnosis present

## 2024-02-29 DIAGNOSIS — A419 Sepsis, unspecified organism: Secondary | ICD-10-CM | POA: Diagnosis not present

## 2024-02-29 DIAGNOSIS — R000 Tachycardia, unspecified: Secondary | ICD-10-CM | POA: Diagnosis not present

## 2024-02-29 LAB — LACTIC ACID, PLASMA
Lactic Acid, Venous: 1.2 mmol/L (ref 0.5–1.9)
Lactic Acid, Venous: 0.9 mmol/L (ref 0.5–1.9)

## 2024-02-29 LAB — URINALYSIS, W/ REFLEX TO CULTURE (INFECTION SUSPECTED)
Bacteria, UA: NONE SEEN
Bilirubin Urine: NEGATIVE
Glucose, UA: NEGATIVE mg/dL
Hgb urine dipstick: NEGATIVE
Ketones, ur: NEGATIVE mg/dL
Leukocytes,Ua: NEGATIVE
Nitrite: NEGATIVE
Protein, ur: NEGATIVE mg/dL
Specific Gravity, Urine: 1.009 (ref 1.005–1.030)
pH: 7 (ref 5.0–8.0)

## 2024-02-29 LAB — CBC WITH DIFFERENTIAL/PLATELET
Abs Immature Granulocytes: 0.25 K/uL — ABNORMAL HIGH (ref 0.00–0.07)
Basophils Absolute: 0.1 K/uL (ref 0.0–0.1)
Basophils Relative: 0 %
Eosinophils Absolute: 0.1 K/uL (ref 0.0–0.5)
Eosinophils Relative: 1 %
HCT: 17 % — ABNORMAL LOW (ref 39.0–52.0)
Hemoglobin: 5.4 g/dL — CL (ref 13.0–17.0)
Immature Granulocytes: 1 %
Lymphocytes Relative: 13 %
Lymphs Abs: 3.1 K/uL (ref 0.7–4.0)
MCH: 30.3 pg (ref 26.0–34.0)
MCHC: 31.8 g/dL (ref 30.0–36.0)
MCV: 95.5 fL (ref 80.0–100.0)
Monocytes Absolute: 0.8 K/uL (ref 0.1–1.0)
Monocytes Relative: 3 %
Neutro Abs: 19.8 K/uL — ABNORMAL HIGH (ref 1.7–7.7)
Neutrophils Relative %: 82 %
Platelets: 404 K/uL — ABNORMAL HIGH (ref 150–400)
RBC: 1.78 MIL/uL — ABNORMAL LOW (ref 4.22–5.81)
RDW: 14.6 % (ref 11.5–15.5)
WBC: 24.1 K/uL — ABNORMAL HIGH (ref 4.0–10.5)
nRBC: 0.1 % (ref 0.0–0.2)

## 2024-02-29 LAB — COMPREHENSIVE METABOLIC PANEL WITH GFR
ALT: 14 U/L (ref 0–44)
AST: 17 U/L (ref 15–41)
Albumin: 2.9 g/dL — ABNORMAL LOW (ref 3.5–5.0)
Alkaline Phosphatase: 49 U/L (ref 38–126)
Anion gap: 8 (ref 5–15)
BUN: 43 mg/dL — ABNORMAL HIGH (ref 8–23)
CO2: 22 mmol/L (ref 22–32)
Calcium: 8.8 mg/dL — ABNORMAL LOW (ref 8.9–10.3)
Chloride: 106 mmol/L (ref 98–111)
Creatinine, Ser: 0.84 mg/dL (ref 0.61–1.24)
GFR, Estimated: >60 mL/min (ref >60)
Glucose, Bld: 135 mg/dL — ABNORMAL HIGH (ref 70–99)
Potassium: 3.7 mmol/L (ref 3.5–5.1)
Sodium: 136 mmol/L (ref 135–145)
Total Bilirubin: 0.3 mg/dL (ref 0.0–1.2)
Total Protein: 6 g/dL — ABNORMAL LOW (ref 6.5–8.1)

## 2024-02-29 LAB — PREPARE RBC (CROSSMATCH)

## 2024-02-29 LAB — MRSA NEXT GEN BY PCR, NASAL: MRSA by PCR Next Gen: NOT DETECTED

## 2024-02-29 LAB — HEMOGLOBIN AND HEMATOCRIT, BLOOD
HCT: 26.3 % — ABNORMAL LOW (ref 39.0–52.0)
Hemoglobin: 9 g/dL — ABNORMAL LOW (ref 13.0–17.0)

## 2024-02-29 LAB — PROTIME-INR
INR: 1.1 (ref 0.8–1.2)
Prothrombin Time: 14.6 s (ref 11.4–15.2)

## 2024-02-29 LAB — RESP PANEL BY RT-PCR (RSV, FLU A&B, COVID)  RVPGX2
Influenza A by PCR: NEGATIVE
Influenza B by PCR: NEGATIVE
Resp Syncytial Virus by PCR: NEGATIVE
SARS Coronavirus 2 by RT PCR: NEGATIVE

## 2024-02-29 MED ORDER — ALBUTEROL SULFATE (2.5 MG/3ML) 0.083% IN NEBU: 2.5000 mg | INHALATION_SOLUTION | RESPIRATORY_TRACT | Status: DC | PRN

## 2024-02-29 MED ORDER — SODIUM CHLORIDE 0.9 % IV BOLUS
2000.0000 mL | Freq: Once | INTRAVENOUS | Status: AC
  Administered 2024-02-29: 2000 mL via INTRAVENOUS

## 2024-02-29 MED ORDER — CHLORHEXIDINE GLUCONATE CLOTH 2 % EX PADS
6.0000 | MEDICATED_PAD | Freq: Every day | CUTANEOUS | Status: DC
  Administered 2024-02-29 – 2024-03-02 (×3): 6 via TOPICAL

## 2024-02-29 MED ORDER — HYDRALAZINE HCL 20 MG/ML IJ SOLN: 5.0000 mg | INTRAMUSCULAR | Status: DC | PRN

## 2024-02-29 MED ORDER — ALPRAZOLAM 0.25 MG PO TABS
0.2500 mg | ORAL_TABLET | Freq: Three times a day (TID) | ORAL | Status: DC | PRN
  Administered 2024-02-29 – 2024-03-01 (×3): 0.25 mg via ORAL
  Filled 2024-02-29 (×3): qty 1

## 2024-02-29 MED ORDER — METRONIDAZOLE 500 MG/100ML IV SOLN
500.0000 mg | Freq: Once | INTRAVENOUS | Status: AC
  Administered 2024-02-29: 500 mg via INTRAVENOUS
  Filled 2024-02-29: qty 100

## 2024-02-29 MED ORDER — BOOST / RESOURCE BREEZE PO LIQD CUSTOM
1.0000 | Freq: Three times a day (TID) | ORAL | Status: DC
  Administered 2024-02-29 – 2024-03-02 (×5): 1 via ORAL

## 2024-02-29 MED ORDER — MORPHINE SULFATE (PF) 2 MG/ML IV SOLN
2.0000 mg | INTRAVENOUS | Status: DC | PRN
  Administered 2024-02-29 – 2024-03-01 (×5): 2 mg via INTRAVENOUS
  Filled 2024-02-29 (×5): qty 1

## 2024-02-29 MED ORDER — HYDROCODONE-ACETAMINOPHEN 10-325 MG PO TABS
1.0000 | ORAL_TABLET | Freq: Four times a day (QID) | ORAL | Status: DC | PRN
Start: 2024-02-29 — End: 2024-03-02
  Administered 2024-02-29 – 2024-03-02 (×5): 1 via ORAL
  Filled 2024-02-29 (×5): qty 1

## 2024-02-29 MED ORDER — ONDANSETRON HCL 4 MG/2ML IJ SOLN: 4.0000 mg | Freq: Four times a day (QID) | INTRAMUSCULAR | Status: DC | PRN

## 2024-02-29 MED ORDER — PANTOPRAZOLE SODIUM 40 MG IV SOLR
40.0000 mg | Freq: Two times a day (BID) | INTRAVENOUS | Status: DC
  Administered 2024-02-29 – 2024-03-02 (×4): 40 mg via INTRAVENOUS
  Filled 2024-02-29 (×4): qty 10

## 2024-02-29 MED ORDER — SODIUM CHLORIDE 0.9% IV SOLUTION: Freq: Once | INTRAVENOUS | Status: AC

## 2024-02-29 MED ORDER — ONDANSETRON HCL 4 MG PO TABS: 4.0000 mg | ORAL_TABLET | Freq: Four times a day (QID) | ORAL | Status: DC | PRN

## 2024-02-29 MED ORDER — PANTOPRAZOLE SODIUM 40 MG IV SOLR
40.0000 mg | Freq: Once | INTRAVENOUS | Status: AC
Start: 2024-02-29 — End: 2024-02-29
  Administered 2024-02-29: 40 mg via INTRAVENOUS
  Filled 2024-02-29: qty 10

## 2024-02-29 MED ORDER — ACETAMINOPHEN 650 MG RE SUPP: 650.0000 mg | Freq: Four times a day (QID) | RECTAL | Status: DC | PRN

## 2024-02-29 MED ORDER — SODIUM CHLORIDE 0.9 % IV SOLN: INTRAVENOUS | Status: DC

## 2024-02-29 MED ORDER — ACETAMINOPHEN 325 MG PO TABS
650.0000 mg | ORAL_TABLET | Freq: Four times a day (QID) | ORAL | Status: DC | PRN
  Administered 2024-02-29 – 2024-03-01 (×2): 650 mg via ORAL
  Filled 2024-02-29 (×2): qty 2

## 2024-02-29 MED ORDER — SODIUM CHLORIDE 0.9 % IV SOLN
2.0000 g | Freq: Once | INTRAVENOUS | Status: AC
  Administered 2024-02-29: 2 g via INTRAVENOUS
  Filled 2024-02-29: qty 20

## 2024-02-29 MED ORDER — NICOTINE 14 MG/24HR TD PT24
14.0000 mg | MEDICATED_PATCH | Freq: Every day | TRANSDERMAL | Status: DC
  Administered 2024-02-29 – 2024-03-02 (×3): 14 mg via TRANSDERMAL
  Filled 2024-02-29 (×3): qty 1

## 2024-02-29 NOTE — H&P (Signed)
 History and Physical    MICO SPARK FMW:[id number] DOB: 01/27/1956 DOA: 02/29/2024  PCP: [name], MD   Patient coming from: Home  I have personally briefly reviewed patient's old medical records in [facility]  Chief Complaint: Fatigue  HPI: Russell [name] is a 68 y.o. male with medical history significant of hypertension, COPD, sleep apnea, aortic stenosis status post aortic valve replacement, hyperlipidemia, GAD, chronic pain syndrome on chronic opiates and tobacco abuse presented with fatigue.  Patient is a poor historian and does not elaborate on symptoms.  Patient apparently has been having nausea, diarrhea for the last 2 days along with some dark stools.  Patient denies any vomiting.  He also complains of cough and cold but unsure about fever.  No loss of consciousness, seizures, worsening abdominal pain, vomiting, chest pain or palpitations reported.  ED Course: EMS reported systolic blood pressure in the 90s.  On presentation, systolic blood pressure was in the 90s; he was tachycardic and tachypneic.  Afebrile.  Hemoglobin 5.4 (prior hemoglobin on 01/27/2024 was 13.5).  INR 1.1, lactic acid 1.2, FOBT positive.  Blood cultures were sent.  He was given 1 dose of Rocephin  and Flagyl .  IV Protonix  and IV fluids started.  Packed red cells transfusion was ordered.  GI was consulted. Hospitalist service was called to evaluate the patient.  Review of Systems: As per HPI otherwise all other systems were reviewed and are negative.   Past Medical History:  Diagnosis Date   Anxiety    Arthritis    Back pain    Cervical disc disease    C6-7   Chronic kidney disease    kidney cyst   COPD (chronic obstructive pulmonary disease) (HCC)    Essential hypertension    Lung nodules    Shortness of breath dyspnea    Sleep apnea    per wife, no sleep study    Past Surgical History:  Procedure Laterality Date   AORTIC VALVE REPLACEMENT N/A 06/17/2014   Procedure: AORTIC  VALVE REPLACEMENT (AVR);  Surgeon: Dorise POUR Fellers, MD;  Location: [facility] OR;  Service: Open Heart Surgery;  Laterality: N/A;   BACK SURGERY     CARDIAC CATHETERIZATION  05/23/14   COLONOSCOPY  10/19/2011   Procedure: COLONOSCOPY;  Surgeon: Oneil DELENA Budge, MD;  Location: [facility];  Service: Gastroenterology;  Laterality: N/A;   COLONOSCOPY WITH PROPOFOL  N/A 08/16/2023   Procedure: COLONOSCOPY WITH PROPOFOL ;  Surgeon: Eartha Angelia Sieving, MD;  Location: [facility];  Service: Gastroenterology;  Laterality: N/A;  1:00PM;ASA 3   LEFT AND RIGHT HEART CATHETERIZATION WITH CORONARY ANGIOGRAM N/A 05/23/2014   Procedure: LEFT AND RIGHT HEART CATHETERIZATION WITH CORONARY ANGIOGRAM;  Surgeon: Candyce GORMAN Reek, MD;  Location: [facility] CATH LAB;  Service: Cardiovascular;  Laterality: N/A;   MITRAL VALVE REPAIR N/A 06/17/2014   Procedure: MITRAL VALVE REPAIR (MVR);  Surgeon: Dorise POUR Fellers, MD;  Location: [facility] OR;  Service: Open Heart Surgery;  Laterality: N/A;   POLYPECTOMY  08/16/2023   Procedure: POLYPECTOMY, INTESTINE;  Surgeon: Eartha Angelia, Sieving, MD;  Location: [facility];  Service: Gastroenterology;;   Right knee surgery     Cartilage removed   TEE WITHOUT CARDIOVERSION N/A 06/17/2014   Procedure: TRANSESOPHAGEAL ECHOCARDIOGRAM (TEE);  Surgeon: Dorise POUR Fellers, MD;  Location: [facility] OR;  Service: Open Heart Surgery;  Laterality: N/A;     reports that he has been smoking cigarettes. He started smoking about 55 years ago. He has a 13.9  pack-year smoking history. He has never used smokeless tobacco. He reports that he does not drink alcohol and does not use drugs.  Allergies  Allergen Reactions   Asa [Aspirin ] Diarrhea and Nausea And Vomiting   Prozac [Fluoxetine Hcl] Other (See Comments)    confusion    Family History  Problem Relation Age of Onset   Atrial fibrillation Mother    Heart disease Father    Heart disease Maternal Grandfather     Prior to Admission medications   Medication Sig  Start Date End Date Taking? Authorizing Provider  albuterol  (PROVENTIL  HFA;VENTOLIN  HFA) 108 (90 BASE) MCG/ACT inhaler Inhale 1-2 puffs into the lungs every 6 (six) hours as needed for wheezing or shortness of breath.    [provider]  ALPRAZolam  (XANAX ) 1 MG tablet Take 0.5-1 tablets (0.5-1 mg total) by mouth 3 (three) times daily as needed for anxiety. 08/10/23   [name], MD  amLODipine  (NORVASC ) 5 MG tablet Take 1 tablet (5 mg total) by mouth daily. 08/10/23   [name], MD  aspirin  EC 81 MG tablet Take 1 tablet (81 mg total) by mouth daily. 09/24/14   Charls Pearla LABOR, MD  carvedilol  (COREG ) 12.5 MG tablet Take 1 tablet (12.5 mg total) by mouth 2 (two) times daily. 02/14/24   Patel, Rutwik K, MD  fenofibrate 54 MG tablet TAKE 1 TABLET BY MOUTH DAILY FOR HIGH TRIGLYCERIDES. 09/09/23   [name], MD  ferrous gluconate  [facility]) 324 MG tablet Take 1 tablet (324 mg total) by mouth 2 (two) times daily with a meal. 06/24/14   Collins, Gina L, PA-C  furosemide  (LASIX ) 20 MG tablet TAKE ONE TABLET BY MOUTH EVERY DAY 02/23/24   [name], MD  gabapentin  (NEURONTIN ) 300 MG capsule Take 1 capsule (300 mg total) by mouth 2 (two) times daily. 11/08/23   [name], MD  HYDROcodone -acetaminophen  (NORCO) 10-325 MG tablet TAKE ONE TABLET BY MOUTH EVERY 6 HOURS AS NEEDED 02/27/24   [name], MD  lisinopril  (ZESTRIL ) 40 MG tablet TAKE 1 TABLET BY MOUTH ONCE DAILY. 02/03/24   Patel, Rutwik K, MD  Multiple Vitamin (MULTI-VITAMIN PO) Take 1 tablet by mouth 2 (two) times daily.     [provider]  ondansetron  (ZOFRAN ) 4 MG tablet Take 4 mg by mouth as needed.  09/06/17   [provider]    Physical Exam: Vitals:   02/29/24 0936 02/29/24 0945 02/29/24 0950 02/29/24 0957  BP: 95/80 111/84 111/60 (!) 126/90  Pulse: (!) 116 (!) 107 (!) 109 (!) 116  Resp: (!) 25 (!) 21 (!) 21 19  Temp: 98.2 F (36.8 C)   98.2 F (36.8 C)  TempSrc: Oral   Oral  SpO2: 100%  95% 96% 100%  Weight:      Height:        Constitutional: NAD, calm, comfortable.  Looks chronically ill and deconditioned. Vitals:   02/29/24 0936 02/29/24 0945 02/29/24 0950 02/29/24 0957  BP: 95/80 111/84 111/60 (!) 126/90  Pulse: (!) 116 (!) 107 (!) 109 (!) 116  Resp: (!) 25 (!) 21 (!) 21 19  Temp: 98.2 F (36.8 C)   98.2 F (36.8 C)  TempSrc: Oral   Oral  SpO2: 100% 95% 96% 100%  Weight:      Height:       Eyes: PERRL, pallor present.   ENMT: Mucous membranes are dry.  Posterior pharynx clear of any exudate or lesions. Neck: normal, supple, no masses,  no thyromegaly Respiratory: bilateral decreased breath sounds at bases, no wheezing, no crackles.  Tachypneic intermittently.  No accessory muscle use.  Cardiovascular: S1 S2 positive, guarding.  No extremity edema. 2+ pedal pulses.  Abdomen: no tenderness, no masses palpated. No hepatosplenomegaly. Bowel sounds positive.  Musculoskeletal: no clubbing / cyanosis. No joint deformity upper and lower extremities.  Skin: no rashes, lesions, ulcers. No induration Neurologic: CN 2-12 grossly intact. Moving extremities. No focal neurologic deficits.  Poor historian.  Slow to respond.  Wakes up slightly, falls back asleep. Psychiatric: Flat affect.  Not agitated.  Labs on Admission: I have personally reviewed following labs and imaging studies  CBC: Recent Labs  Lab 02/29/24 0801  WBC 24.1*  NEUTROABS 19.8*  HGB 5.4*  HCT 17.0*  MCV 95.5  PLT 404*   Basic Metabolic Panel: Recent Labs  Lab 02/29/24 0801  NA 136  K 3.7  CL 106  CO2 22  GLUCOSE 135*  BUN 43*  CREATININE 0.84  CALCIUM  8.8*   GFR: Estimated Creatinine Clearance: 68.5 mL/min (by C-G formula based on SCr of 0.84 mg/dL). Liver Function Tests: Recent Labs  Lab 02/29/24 0801  AST 17  ALT 14  ALKPHOS 49  BILITOT 0.3  PROT 6.0*  ALBUMIN  2.9*   No results for input(s): LIPASE, AMYLASE in the last 168 hours. No results for input(s): AMMONIA in  the last 168 hours. Coagulation Profile: Recent Labs  Lab 02/29/24 0801  INR 1.1   Cardiac Enzymes: No results for input(s): CKTOTAL, CKMB, CKMBINDEX, TROPONINI in the last 168 hours. BNP (last 3 results) No results for input(s): PROBNP in the last 8760 hours. HbA1C: No results for input(s): HGBA1C in the last 72 hours. CBG: No results for input(s): GLUCAP in the last 168 hours. Lipid Profile: No results for input(s): CHOL, HDL, LDLCALC, TRIG, CHOLHDL, LDLDIRECT in the last 72 hours. Thyroid Function Tests: No results for input(s): TSH, T4TOTAL, FREET4, T3FREE, THYROIDAB in the last 72 hours. Anemia Panel: No results for input(s): VITAMINB12, FOLATE, FERRITIN, TIBC, IRON , RETICCTPCT in the last 72 hours. Urine analysis:    Component Value Date/Time   COLORURINE YELLOW 06/20/2014 1000   APPEARANCEUR CLEAR 06/20/2014 1000   LABSPEC 1.007 06/20/2014 1000   PHURINE 7.5 06/20/2014 1000   GLUCOSEU NEGATIVE 06/20/2014 1000   HGBUR NEGATIVE 06/20/2014 1000   BILIRUBINUR NEGATIVE 06/20/2014 1000   KETONESUR NEGATIVE 06/20/2014 1000   PROTEINUR NEGATIVE 06/20/2014 1000   UROBILINOGEN 0.2 06/20/2014 1000   NITRITE NEGATIVE 06/20/2014 1000   LEUKOCYTESUR NEGATIVE 06/20/2014 1000    Radiological Exams on Admission: DG Chest Port 1 View Result Date: 02/29/2024 CLINICAL DATA:  Sepsis.  Nausea and diarrhea. EXAM: PORTABLE CHEST 1 VIEW COMPARISON:  08/28/2014 FINDINGS: Lungs are hyperexpanded. Biapical pleuroparenchymal scarring evident. Streaky density in the left suprahilar region is stable. No focal consolidation, edema, or substantial pleural effusion. The cardiopericardial silhouette is within normal limits for size. No acute bony abnormality. Telemetry leads overlie the chest. IMPRESSION: Hyperexpansion with biapical pleuroparenchymal scarring. No acute cardiopulmonary findings. Electronically Signed   By: Camellia Candle M.D.   On:  02/29/2024 08:12   Assessment/Plan  Acute blood loss anemia Possible upper GI bleeding -Hemoglobin 5.4 on presentation (prior hemoglobin on 01/27/2024 was 13.5).  3 units packed red cells transfusion has been ordered in the ED.  Continue H&H monitoring.  Continue IV Protonix .  GI has been consulted by ED provider.  Follow recommendations. - Baby aspirin  held.  Continue IV fluids  Leukocytosis - Possibly  reactive from above.  Patient presented with diarrhea.  Stool for C. difficile/GI pathogen is pending.  No need for antibiotics at this time.  Monitor.  Thrombocytosis - Possibly reactive.  Monitor  Essential hypertension - Monitor blood pressure.  Hold antihypertensives for now.  Hyperlipidemia - Outpatient follow-up.  Fenofibrate once able to take orally  Chronic pain with opiate dependence - Continue home regimen once verified.  Outpatient follow-up with PCP/pain management  DVT prophylaxis: SCDs Code Status: Full Family Communication: None at bedside Disposition Plan: Home in 1 to 2 days mostly improved Consults called: GI called by ED provider Admission status: Observation/stepdown Severity of Illness: The appropriate patient status for this patient is OBSERVATION. Observation status is judged to be reasonable and necessary in order to provide the required intensity of service to ensure the patient's safety. The patient's presenting symptoms, physical exam findings, and initial radiographic and laboratory data in the context of their medical condition is felt to place them at decreased risk for further clinical deterioration. Furthermore, it is anticipated that the patient will be medically stable for discharge from the hospital within 2 midnights of admission.     Sophie Mao MD [facility]  02/29/2024, 10:36 AM

## 2024-02-29 NOTE — Sepsis Progress Note (Signed)
 Sepsis protocol monitored by eLink

## 2024-02-29 NOTE — Consult Note (Addendum)
 Gastroenterology Consult   Referring Provider: No ref. provider found Primary Care Physician:  [name], MD Primary Gastroenterologist:  Dr. Eartha   Patient ID: Russell [name]; [id number]; [birth year]   Admit date: 02/29/2024  LOS: 0 days   Date of Consultation: 02/29/2024  Reason for Consultation:  anemia, melena   History of Present Illness   Russell [name] is a 68 y.o. year old male with history of anxiety, arthritis, CKD, COPD, HTN sleep apnea who presented to the ED via EMS with c/o fatigue, nausea, diarrhea, hypotension reported by EMS with BP 90s systolic. Hgb 5.4 on arrival, GI consulted for further evaluation  ED course: BP 98/57, HR 109, Resp 26, Afebrile  Hgb 5.4  GI path and C diff pending INR 1.1  Lactic 1.2  Blood cultures pending  FOBT positive   Consult: Patient somewhat of a poor historian, brother at bedside helps to provide history. of note, patient is restless, slightly agitated during our encounter, though he is alert and oriented to person, place and time. Brother at bedside states he does not seem himself. Patient States he began feeling weak and tired on 9/14. Denies abdominal pain, nausea, vomiting. He feels short of breath. He endorses diarrhea that began a few days ago, after initial onset of other symptoms. He denies any recent antibiotic exposure.  Denies BRBPR. Endorses black stools but unable to tell me when it began,  Denies blood thinners. States he takes a daily 81mg  aspirin  and sometimes takes tylenol . Denies other NSAIDs. Smokes about 1/2 PPD, no ETOH. Denies any history of UGIB in the past.  Denies dysphagia, early satiety, GERD symptoms.    Last Colonoscopy: 08/2023 - Four 3 to 5 mm polyps in the ascending colon and                            in the cecum, removed with a cold snare. Resected                            and retrieved.                           - One 3 mm polyp in the descending colon, removed                             with a cold snare. Resected and retrieved.                           - Diverticulosis in the sigmoid colon.                           - The distal rectum and anal verge are normal on                            retroflexion view.  COLON, CECUM, ASCENDING, POLYPECTOMY:       Tubular adenoma.       Sessile serrated polyp without cytologic dysplasia.       Negative for high-grade dysplasia.   B. COLON, DESCENDING, POLYPECTOMY:       Tubular adenoma.       Negative for high-grade dysplasia   Last EGD: never  Past Medical History:  Diagnosis Date   Anxiety    Arthritis    Back pain    Cervical disc disease    C6-7   Chronic kidney disease    kidney cyst   COPD (chronic obstructive pulmonary disease) (HCC)    Essential hypertension    Lung nodules    Shortness of breath dyspnea    Sleep apnea    per wife, no sleep study    Past Surgical History:  Procedure Laterality Date   AORTIC VALVE REPLACEMENT N/A 06/17/2014   Procedure: AORTIC VALVE REPLACEMENT (AVR);  Surgeon: Dorise MARLA Fellers, MD;  Location: [facility] OR;  Service: Open Heart Surgery;  Laterality: N/A;   BACK SURGERY     CARDIAC CATHETERIZATION  05/23/14   COLONOSCOPY  10/19/2011   Procedure: COLONOSCOPY;  Surgeon: Oneil DELENA Budge, MD;  Location: [facility];  Service: Gastroenterology;  Laterality: N/A;   COLONOSCOPY WITH PROPOFOL  N/A 08/16/2023   Procedure: COLONOSCOPY WITH PROPOFOL ;  Surgeon: Eartha Angelia Sieving, MD;  Location: [facility];  Service: Gastroenterology;  Laterality: N/A;  1:00PM;ASA 3   LEFT AND RIGHT HEART CATHETERIZATION WITH CORONARY ANGIOGRAM N/A 05/23/2014   Procedure: LEFT AND RIGHT HEART CATHETERIZATION WITH CORONARY ANGIOGRAM;  Surgeon: Candyce GORMAN Reek, MD;  Location: [facility] CATH LAB;  Service: Cardiovascular;  Laterality: N/A;   MITRAL VALVE REPAIR N/A 06/17/2014   Procedure: MITRAL VALVE REPAIR (MVR);  Surgeon: Dorise MARLA Fellers, MD;  Location: [facility] OR;  Service: Open Heart Surgery;  Laterality:  N/A;   POLYPECTOMY  08/16/2023   Procedure: POLYPECTOMY, INTESTINE;  Surgeon: Eartha Angelia, Sieving, MD;  Location: [facility];  Service: Gastroenterology;;   Right knee surgery     Cartilage removed   TEE WITHOUT CARDIOVERSION N/A 06/17/2014   Procedure: TRANSESOPHAGEAL ECHOCARDIOGRAM (TEE);  Surgeon: Dorise MARLA Fellers, MD;  Location: [facility] OR;  Service: Open Heart Surgery;  Laterality: N/A;    Prior to Admission medications   Medication Sig Start Date End Date Taking? Authorizing Provider  albuterol  (PROVENTIL  HFA;VENTOLIN  HFA) 108 (90 BASE) MCG/ACT inhaler Inhale 1-2 puffs into the lungs every 6 (six) hours as needed for wheezing or shortness of breath.    [provider]  ALPRAZolam  (XANAX ) 1 MG tablet Take 0.5-1 tablets (0.5-1 mg total) by mouth 3 (three) times daily as needed for anxiety. 08/10/23   Tobie Suzzane MARLA, MD  amLODipine  (NORVASC ) 5 MG tablet Take 1 tablet (5 mg total) by mouth daily. 08/10/23   Tobie Suzzane MARLA, MD  aspirin  EC 81 MG tablet Take 1 tablet (81 mg total) by mouth daily. 09/24/14   Charls Pearla DELENA, MD  carvedilol  (COREG ) 12.5 MG tablet Take 1 tablet (12.5 mg total) by mouth 2 (two) times daily. 02/14/24   Patel, Rutwik K, MD  fenofibrate 54 MG tablet TAKE 1 TABLET BY MOUTH DAILY FOR HIGH TRIGLYCERIDES. 09/09/23   Tobie Suzzane MARLA, MD  ferrous gluconate  [facility]) 324 MG tablet Take 1 tablet (324 mg total) by mouth 2 (two) times daily with a meal. 06/24/14   Collins, Tillman L, PA-C  furosemide  (LASIX ) 20 MG tablet TAKE ONE TABLET BY MOUTH EVERY DAY 02/23/24   Tobie Suzzane MARLA, MD  gabapentin  (NEURONTIN ) 300 MG capsule Take 1 capsule (300 mg total) by mouth 2 (two) times daily. 11/08/23   Tobie Suzzane MARLA, MD  HYDROcodone -acetaminophen  (NORCO) 10-325 MG tablet TAKE ONE TABLET BY MOUTH EVERY 6 HOURS AS NEEDED 02/27/24   Tobie Suzzane MARLA, MD  lisinopril  (ZESTRIL ) 40  MG tablet TAKE 1 TABLET BY MOUTH ONCE DAILY. 02/03/24   Patel, Rutwik K, MD  Multiple Vitamin (MULTI-VITAMIN PO)  Take 1 tablet by mouth 2 (two) times daily.     [provider]  ondansetron  (ZOFRAN ) 4 MG tablet Take 4 mg by mouth as needed.  09/06/17   [provider]    Current Facility-Administered Medications  Medication Dose Route Frequency Provider Last Rate Last Admin   0.9 %  sodium chloride  infusion (Manually program via Guardrails IV Fluids)   Intravenous Once Suzette Pac, MD       metroNIDAZOLE  (FLAGYL ) IVPB 500 mg  500 mg Intravenous Once Zammit, Joseph, MD 100 mL/hr at 02/29/24 0851 500 mg at 02/29/24 9148   Current Outpatient Medications  Medication Sig Dispense Refill   albuterol  (PROVENTIL  HFA;VENTOLIN  HFA) 108 (90 BASE) MCG/ACT inhaler Inhale 1-2 puffs into the lungs every 6 (six) hours as needed for wheezing or shortness of breath.     ALPRAZolam  (XANAX ) 1 MG tablet Take 0.5-1 tablets (0.5-1 mg total) by mouth 3 (three) times daily as needed for anxiety. 90 tablet 3   amLODipine  (NORVASC ) 5 MG tablet Take 1 tablet (5 mg total) by mouth daily. 90 tablet 1   aspirin  EC 81 MG tablet Take 1 tablet (81 mg total) by mouth daily.     carvedilol  (COREG ) 12.5 MG tablet Take 1 tablet (12.5 mg total) by mouth 2 (two) times daily. 60 tablet 5   fenofibrate 54 MG tablet TAKE 1 TABLET BY MOUTH DAILY FOR HIGH TRIGLYCERIDES. 90 tablet 3   ferrous gluconate  (FERGON) 324 MG tablet Take 1 tablet (324 mg total) by mouth 2 (two) times daily with a meal. 60 tablet 1   furosemide  (LASIX ) 20 MG tablet TAKE ONE TABLET BY MOUTH EVERY DAY 90 tablet 1   gabapentin  (NEURONTIN ) 300 MG capsule Take 1 capsule (300 mg total) by mouth 2 (two) times daily. 180 capsule 1   HYDROcodone -acetaminophen  (NORCO) 10-325 MG tablet TAKE ONE TABLET BY MOUTH EVERY 6 HOURS AS NEEDED 120 tablet 0   lisinopril  (ZESTRIL ) 40 MG tablet TAKE 1 TABLET BY MOUTH ONCE DAILY. 90 tablet 3   Multiple Vitamin (MULTI-VITAMIN PO) Take 1 tablet by mouth 2 (two) times daily.      ondansetron  (ZOFRAN ) 4 MG tablet Take 4 mg by mouth  as needed.       Allergies as of 02/29/2024 - Review Complete 02/29/2024  Allergen Reaction Noted   Dorethia Chew ] Diarrhea and Nausea And Vomiting 10/19/2011   Prozac [fluoxetine hcl] Other (See Comments) 10/19/2011    Family History  Problem Relation Age of Onset   Atrial fibrillation Mother    Heart disease Father    Heart disease Maternal Grandfather     Social History   Socioeconomic History   Marital status: Married    Spouse name: Not on file   Number of children: Not on file   Years of education: Not on file   Highest education level: Not on file  Occupational History   Not on file  Tobacco Use   Smoking status: Every Day    Current packs/day: 0.25    Average packs/day: 0.3 packs/day for 55.7 years (13.9 ttl pk-yrs)    Types: Cigarettes    Start date: 06/22/1968   Smokeless tobacco: Never   Tobacco comments:    patient restarted smoking  Vaping Use   Vaping status: Never Used  Substance and Sexual Activity   Alcohol use: No  Alcohol/week: 0.0 standard drinks of alcohol   Drug use: No   Sexual activity: Yes  Other Topics Concern   Not on file  Social History Narrative   Not on file   Social Drivers of Health   Financial Resource Strain: Not on file  Food Insecurity: Not on file  Transportation Needs: Not on file  Physical Activity: Not on file  Stress: Not on file  Social Connections: Not on file  Intimate Partner Violence: Not on file     Review of Systems   Gen: Denies any fever, chills, loss of appetite, change in weight or weight loss +body aches CV: Denies chest pain, heart palpitations, syncope, edema  Resp: Denies cough, wheezing, coughing up blood, and pleurisy. +SOB GI:  denies hematochezia, nausea, vomiting, constipation, dysphagia, odyonophagia, early satiety or weight loss. +melena +diarrhea  GU : Denies urinary burning, blood in urine, urinary frequency, and urinary incontinence. MS: Denies limitation of movement, swelling, cramps,  and atrophy.  Derm: Denies rash, itching, dry skin, hives. Psych: Denies depression, anxiety, memory loss, hallucinations, and confusion. Heme: Denies bruising or bleeding Neuro:  Denies any headaches, dizziness, paresthesias, shaking  Physical Exam   Vital Signs in last 24 hours: Temp:  [98.2 F (36.8 C)] 98.2 F (36.8 C) (09/17 0925) Pulse Rate:  [95-109] 109 (09/17 0925) Resp:  [18-26] 24 (09/17 0925) BP: (98-136)/(51-77) 134/57 (09/17 0925) SpO2:  [93 %-100 %] 100 % (09/17 0925) Weight:  [63.2 kg] 63.2 kg (09/17 0749)   General:   Alert,  Well-developed, well-nourished, restless but mostly cooperative. Toxic appearing  Head:  Normocephalic and atraumatic. Eyes:  Sclera clear, no icterus.   Conjunctiva pink. Ears:  Normal auditory acuity. Mouth:  dry mucous membranes Lungs:  Clear throughout to auscultation.   No wheezes, crackles, or rhonchi. No acute distress. Heart:  Regular rate and rhythm; no murmurs, clicks, rubs,  or gallops. Abdomen:  Soft, nontender and nondistended. No masses, hepatosplenomegaly or hernias noted. Normal bowel sounds, without guarding, and without rebound.   Msk:  Symmetrical without gross deformities. Normal posture. Extremities:  Without clubbing or edema. Neurologic:  Alert and  oriented x4. Skin:  Intact without significant lesions or rashes. Psych:  Alert and cooperative. Normal mood and affect.   Labs/Studies   Recent Labs Recent Labs    02/29/24 0801  WBC 24.1*  HGB 5.4*  HCT 17.0*  PLT 404*   BMET Recent Labs    02/29/24 0801  NA 136  K 3.7  CL 106  CO2 22  GLUCOSE 135*  BUN 43*  CREATININE 0.84  CALCIUM  8.8*   LFT Recent Labs    02/29/24 0801  PROT 6.0*  ALBUMIN  2.9*  AST 17  ALT 14  ALKPHOS 49  BILITOT 0.3   PT/INR Recent Labs    02/29/24 0801  LABPROT 14.6  INR 1.1     Assessment   Russell [name] is a 68 y.o. year old male with history of anxiety, arthritis, CKD, COPD, HTN sleep apnea who  presented to the ED via EMS with c/o fatigue, nausea, diarrhea, hypotension with hemoglobin of 5.4 on arrival, melanotic stool, GI consulted for further evaluation  Melanotic stools/diarrhea/anemia/weakness: -onset of weakness/fatigue 9/14, melanotic stools a day or two later though not completely clear day of onset -FOBT positive  -denies BRBPR -hgb 5.4, previously 13.5 on 8/15 -BUN elevated indicating UGIB -denies NSAIDs other than baby ASA, no ACs -no abdominal pain, nausea, or vomiting, endorses diarrhea -denies any history of  UGIB in the past -C diff, GI pathogen panel pending  -denies recent antibiotic exposure, no known sick contacts  Differential list is broad for etiology of UGI bleed/anemia. Patient denies GI symptoms other than diarrhea, in setting of melena. Denies any NSAID use, and not on any ACs. Could still be dealing with PUD, though no real offending factors at play here other than advanced age and smoking. Cannot rule out angiectasias, gastritis, esophagitis, malignancy. Will optimize hemodynamic status with blood transfusions/IVF and plan for hopeful EGD tomorrow as long as he is stable, for further evaluation. I discussed this the patient and his brother at bedside. Indications, risks and benefits of procedure discussed in detail with patient. Patient verbalized understanding and is in agreement to proceed with EGD.   Plan / Recommendations   Trend h&H, agree with transfusion as planned today Monitor for overt GI bleeding Avoid all NSAIDs Follow for GI path and C diff  Clear liquids NPO midnight for hopeful EGD tomorrow, pending hemoglobin/hemodynamic stability  PPI BID   02/29/2024, 9:29 AM  Jesenia Spera L. Chloe Bluett, MSN, APRN, AGNP-C Adult-Gerontology Nurse Practitioner [facility] Gastroenterology at [facility]

## 2024-02-29 NOTE — ED Triage Notes (Signed)
 Pt BIB ems for fatigue. Pt has been having nausea and diarrhea for two days. Pt denies vomiting. Pt states coughing with cold sweats unsure about fever. EMS noted pt's BP to be 90s systolic and gave a 100 cc bolus of fluid.

## 2024-02-29 NOTE — H&P (View-Only) (Signed)
 Gastroenterology Consult   Referring Provider: No ref. provider found Primary Care Physician:  [name], MD Primary Gastroenterologist:  Dr. Eartha   Patient ID: Carlin JONELLE Somerset; [id number]; [birth year]   Admit date: 02/29/2024  LOS: 0 days   Date of Consultation: 02/29/2024  Reason for Consultation:  anemia, melena   History of Present Illness   CLYDELL SPOSITO is a 68 y.o. year old male with history of anxiety, arthritis, CKD, COPD, HTN sleep apnea who presented to the ED via EMS with c/o fatigue, nausea, diarrhea, hypotension reported by EMS with BP 90s systolic. Hgb 5.4 on arrival, GI consulted for further evaluation  ED course: BP 98/57, HR 109, Resp 26, Afebrile  Hgb 5.4  GI path and C diff pending INR 1.1  Lactic 1.2  Blood cultures pending  FOBT positive   Consult: Patient somewhat of a poor historian, brother at bedside helps to provide history. of note, patient is restless, slightly agitated during our encounter, though he is alert and oriented to person, place and time. Brother at bedside states he does not seem himself. Patient States he began feeling weak and tired on 9/14. Denies abdominal pain, nausea, vomiting. He feels short of breath. He endorses diarrhea that began a few days ago, after initial onset of other symptoms. He denies any recent antibiotic exposure.  Denies BRBPR. Endorses black stools but unable to tell me when it began,  Denies blood thinners. States he takes a daily 81mg  aspirin  and sometimes takes tylenol . Denies other NSAIDs. Smokes about 1/2 PPD, no ETOH. Denies any history of UGIB in the past.  Denies dysphagia, early satiety, GERD symptoms.    Last Colonoscopy: 08/2023 - Four 3 to 5 mm polyps in the ascending colon and                            in the cecum, removed with a cold snare. Resected                            and retrieved.                           - One 3 mm polyp in the descending colon, removed                             with a cold snare. Resected and retrieved.                           - Diverticulosis in the sigmoid colon.                           - The distal rectum and anal verge are normal on                            retroflexion view.  COLON, CECUM, ASCENDING, POLYPECTOMY:       Tubular adenoma.       Sessile serrated polyp without cytologic dysplasia.       Negative for high-grade dysplasia.   B. COLON, DESCENDING, POLYPECTOMY:       Tubular adenoma.       Negative for high-grade dysplasia   Last EGD: never  Past Medical History:  Diagnosis Date   Anxiety    Arthritis    Back pain    Cervical disc disease    C6-7   Chronic kidney disease    kidney cyst   COPD (chronic obstructive pulmonary disease) (HCC)    Essential hypertension    Lung nodules    Shortness of breath dyspnea    Sleep apnea    per wife, no sleep study    Past Surgical History:  Procedure Laterality Date   AORTIC VALVE REPLACEMENT N/A 06/17/2014   Procedure: AORTIC VALVE REPLACEMENT (AVR);  Surgeon: Dorise MARLA Fellers, MD;  Location: [facility] OR;  Service: Open Heart Surgery;  Laterality: N/A;   BACK SURGERY     CARDIAC CATHETERIZATION  05/23/14   COLONOSCOPY  10/19/2011   Procedure: COLONOSCOPY;  Surgeon: Oneil DELENA Budge, MD;  Location: [facility];  Service: Gastroenterology;  Laterality: N/A;   COLONOSCOPY WITH PROPOFOL  N/A 08/16/2023   Procedure: COLONOSCOPY WITH PROPOFOL ;  Surgeon: Eartha Angelia Sieving, MD;  Location: [facility];  Service: Gastroenterology;  Laterality: N/A;  1:00PM;ASA 3   LEFT AND RIGHT HEART CATHETERIZATION WITH CORONARY ANGIOGRAM N/A 05/23/2014   Procedure: LEFT AND RIGHT HEART CATHETERIZATION WITH CORONARY ANGIOGRAM;  Surgeon: Candyce GORMAN Reek, MD;  Location: [facility] CATH LAB;  Service: Cardiovascular;  Laterality: N/A;   MITRAL VALVE REPAIR N/A 06/17/2014   Procedure: MITRAL VALVE REPAIR (MVR);  Surgeon: Dorise MARLA Fellers, MD;  Location: [facility] OR;  Service: Open Heart Surgery;  Laterality:  N/A;   POLYPECTOMY  08/16/2023   Procedure: POLYPECTOMY, INTESTINE;  Surgeon: Eartha Angelia, Sieving, MD;  Location: [facility];  Service: Gastroenterology;;   Right knee surgery     Cartilage removed   TEE WITHOUT CARDIOVERSION N/A 06/17/2014   Procedure: TRANSESOPHAGEAL ECHOCARDIOGRAM (TEE);  Surgeon: Dorise MARLA Fellers, MD;  Location: [facility] OR;  Service: Open Heart Surgery;  Laterality: N/A;    Prior to Admission medications   Medication Sig Start Date End Date Taking? Authorizing Provider  albuterol  (PROVENTIL  HFA;VENTOLIN  HFA) 108 (90 BASE) MCG/ACT inhaler Inhale 1-2 puffs into the lungs every 6 (six) hours as needed for wheezing or shortness of breath.    [provider]  ALPRAZolam  (XANAX ) 1 MG tablet Take 0.5-1 tablets (0.5-1 mg total) by mouth 3 (three) times daily as needed for anxiety. 08/10/23   Tobie Suzzane MARLA, MD  amLODipine  (NORVASC ) 5 MG tablet Take 1 tablet (5 mg total) by mouth daily. 08/10/23   Tobie Suzzane MARLA, MD  aspirin  EC 81 MG tablet Take 1 tablet (81 mg total) by mouth daily. 09/24/14   Charls Pearla DELENA, MD  carvedilol  (COREG ) 12.5 MG tablet Take 1 tablet (12.5 mg total) by mouth 2 (two) times daily. 02/14/24   Patel, Rutwik K, MD  fenofibrate 54 MG tablet TAKE 1 TABLET BY MOUTH DAILY FOR HIGH TRIGLYCERIDES. 09/09/23   Tobie Suzzane MARLA, MD  ferrous gluconate  [facility]) 324 MG tablet Take 1 tablet (324 mg total) by mouth 2 (two) times daily with a meal. 06/24/14   Collins, Tillman L, PA-C  furosemide  (LASIX ) 20 MG tablet TAKE ONE TABLET BY MOUTH EVERY DAY 02/23/24   Tobie Suzzane MARLA, MD  gabapentin  (NEURONTIN ) 300 MG capsule Take 1 capsule (300 mg total) by mouth 2 (two) times daily. 11/08/23   Tobie Suzzane MARLA, MD  HYDROcodone -acetaminophen  (NORCO) 10-325 MG tablet TAKE ONE TABLET BY MOUTH EVERY 6 HOURS AS NEEDED 02/27/24   Tobie Suzzane MARLA, MD  lisinopril  (ZESTRIL ) 40  MG tablet TAKE 1 TABLET BY MOUTH ONCE DAILY. 02/03/24   Patel, Rutwik K, MD  Multiple Vitamin (MULTI-VITAMIN PO)  Take 1 tablet by mouth 2 (two) times daily.     [provider]  ondansetron  (ZOFRAN ) 4 MG tablet Take 4 mg by mouth as needed.  09/06/17   [provider]    Current Facility-Administered Medications  Medication Dose Route Frequency Provider Last Rate Last Admin   0.9 %  sodium chloride  infusion (Manually program via Guardrails IV Fluids)   Intravenous Once Suzette Pac, MD       metroNIDAZOLE  (FLAGYL ) IVPB 500 mg  500 mg Intravenous Once Zammit, Joseph, MD 100 mL/hr at 02/29/24 0851 500 mg at 02/29/24 9148   Current Outpatient Medications  Medication Sig Dispense Refill   albuterol  (PROVENTIL  HFA;VENTOLIN  HFA) 108 (90 BASE) MCG/ACT inhaler Inhale 1-2 puffs into the lungs every 6 (six) hours as needed for wheezing or shortness of breath.     ALPRAZolam  (XANAX ) 1 MG tablet Take 0.5-1 tablets (0.5-1 mg total) by mouth 3 (three) times daily as needed for anxiety. 90 tablet 3   amLODipine  (NORVASC ) 5 MG tablet Take 1 tablet (5 mg total) by mouth daily. 90 tablet 1   aspirin  EC 81 MG tablet Take 1 tablet (81 mg total) by mouth daily.     carvedilol  (COREG ) 12.5 MG tablet Take 1 tablet (12.5 mg total) by mouth 2 (two) times daily. 60 tablet 5   fenofibrate 54 MG tablet TAKE 1 TABLET BY MOUTH DAILY FOR HIGH TRIGLYCERIDES. 90 tablet 3   ferrous gluconate  (FERGON) 324 MG tablet Take 1 tablet (324 mg total) by mouth 2 (two) times daily with a meal. 60 tablet 1   furosemide  (LASIX ) 20 MG tablet TAKE ONE TABLET BY MOUTH EVERY DAY 90 tablet 1   gabapentin  (NEURONTIN ) 300 MG capsule Take 1 capsule (300 mg total) by mouth 2 (two) times daily. 180 capsule 1   HYDROcodone -acetaminophen  (NORCO) 10-325 MG tablet TAKE ONE TABLET BY MOUTH EVERY 6 HOURS AS NEEDED 120 tablet 0   lisinopril  (ZESTRIL ) 40 MG tablet TAKE 1 TABLET BY MOUTH ONCE DAILY. 90 tablet 3   Multiple Vitamin (MULTI-VITAMIN PO) Take 1 tablet by mouth 2 (two) times daily.      ondansetron  (ZOFRAN ) 4 MG tablet Take 4 mg by mouth  as needed.       Allergies as of 02/29/2024 - Review Complete 02/29/2024  Allergen Reaction Noted   Dorethia Chew ] Diarrhea and Nausea And Vomiting 10/19/2011   Prozac [fluoxetine hcl] Other (See Comments) 10/19/2011    Family History  Problem Relation Age of Onset   Atrial fibrillation Mother    Heart disease Father    Heart disease Maternal Grandfather     Social History   Socioeconomic History   Marital status: Married    Spouse name: Not on file   Number of children: Not on file   Years of education: Not on file   Highest education level: Not on file  Occupational History   Not on file  Tobacco Use   Smoking status: Every Day    Current packs/day: 0.25    Average packs/day: 0.3 packs/day for 55.7 years (13.9 ttl pk-yrs)    Types: Cigarettes    Start date: 06/22/1968   Smokeless tobacco: Never   Tobacco comments:    patient restarted smoking  Vaping Use   Vaping status: Never Used  Substance and Sexual Activity   Alcohol use: No  Alcohol/week: 0.0 standard drinks of alcohol   Drug use: No   Sexual activity: Yes  Other Topics Concern   Not on file  Social History Narrative   Not on file   Social Drivers of Health   Financial Resource Strain: Not on file  Food Insecurity: Not on file  Transportation Needs: Not on file  Physical Activity: Not on file  Stress: Not on file  Social Connections: Not on file  Intimate Partner Violence: Not on file     Review of Systems   Gen: Denies any fever, chills, loss of appetite, change in weight or weight loss +body aches CV: Denies chest pain, heart palpitations, syncope, edema  Resp: Denies cough, wheezing, coughing up blood, and pleurisy. +SOB GI:  denies hematochezia, nausea, vomiting, constipation, dysphagia, odyonophagia, early satiety or weight loss. +melena +diarrhea  GU : Denies urinary burning, blood in urine, urinary frequency, and urinary incontinence. MS: Denies limitation of movement, swelling, cramps,  and atrophy.  Derm: Denies rash, itching, dry skin, hives. Psych: Denies depression, anxiety, memory loss, hallucinations, and confusion. Heme: Denies bruising or bleeding Neuro:  Denies any headaches, dizziness, paresthesias, shaking  Physical Exam   Vital Signs in last 24 hours: Temp:  [98.2 F (36.8 C)] 98.2 F (36.8 C) (09/17 0925) Pulse Rate:  [95-109] 109 (09/17 0925) Resp:  [18-26] 24 (09/17 0925) BP: (98-136)/(51-77) 134/57 (09/17 0925) SpO2:  [93 %-100 %] 100 % (09/17 0925) Weight:  [63.2 kg] 63.2 kg (09/17 0749)   General:   Alert,  Well-developed, well-nourished, restless but mostly cooperative. Toxic appearing  Head:  Normocephalic and atraumatic. Eyes:  Sclera clear, no icterus.   Conjunctiva pink. Ears:  Normal auditory acuity. Mouth:  dry mucous membranes Lungs:  Clear throughout to auscultation.   No wheezes, crackles, or rhonchi. No acute distress. Heart:  Regular rate and rhythm; no murmurs, clicks, rubs,  or gallops. Abdomen:  Soft, nontender and nondistended. No masses, hepatosplenomegaly or hernias noted. Normal bowel sounds, without guarding, and without rebound.   Msk:  Symmetrical without gross deformities. Normal posture. Extremities:  Without clubbing or edema. Neurologic:  Alert and  oriented x4. Skin:  Intact without significant lesions or rashes. Psych:  Alert and cooperative. Normal mood and affect.   Labs/Studies   Recent Labs Recent Labs    02/29/24 0801  WBC 24.1*  HGB 5.4*  HCT 17.0*  PLT 404*   BMET Recent Labs    02/29/24 0801  NA 136  K 3.7  CL 106  CO2 22  GLUCOSE 135*  BUN 43*  CREATININE 0.84  CALCIUM  8.8*   LFT Recent Labs    02/29/24 0801  PROT 6.0*  ALBUMIN  2.9*  AST 17  ALT 14  ALKPHOS 49  BILITOT 0.3   PT/INR Recent Labs    02/29/24 0801  LABPROT 14.6  INR 1.1     Assessment   KALIF KATTNER is a 68 y.o. year old male with history of anxiety, arthritis, CKD, COPD, HTN sleep apnea who  presented to the ED via EMS with c/o fatigue, nausea, diarrhea, hypotension with hemoglobin of 5.4 on arrival, melanotic stool, GI consulted for further evaluation  Melanotic stools/diarrhea/anemia/weakness: -onset of weakness/fatigue 9/14, melanotic stools a day or two later though not completely clear day of onset -FOBT positive  -denies BRBPR -hgb 5.4, previously 13.5 on 8/15 -BUN elevated indicating UGIB -denies NSAIDs other than baby ASA, no ACs -no abdominal pain, nausea, or vomiting, endorses diarrhea -denies any history of  UGIB in the past -C diff, GI pathogen panel pending  -denies recent antibiotic exposure, no known sick contacts  Differential list is broad for etiology of UGI bleed/anemia. Patient denies GI symptoms other than diarrhea, in setting of melena. Denies any NSAID use, and not on any ACs. Could still be dealing with PUD, though no real offending factors at play here other than advanced age and smoking. Cannot rule out angiectasias, gastritis, esophagitis, malignancy. Will optimize hemodynamic status with blood transfusions/IVF and plan for hopeful EGD tomorrow as long as he is stable, for further evaluation. I discussed this the patient and his brother at bedside. Indications, risks and benefits of procedure discussed in detail with patient. Patient verbalized understanding and is in agreement to proceed with EGD.   Plan / Recommendations   Trend h&H, agree with transfusion as planned today Monitor for overt GI bleeding Avoid all NSAIDs Follow for GI path and C diff  Clear liquids NPO midnight for hopeful EGD tomorrow, pending hemoglobin/hemodynamic stability  PPI BID   02/29/2024, 9:29 AM  Jesenia Spera L. Chloe Bluett, MSN, APRN, AGNP-C Adult-Gerontology Nurse Practitioner [facility] G[facility]enterology at [facility]

## 2024-03-01 ENCOUNTER — Ambulatory Visit (HOSPITAL_COMMUNITY): Admission: RE | Admit: 2024-03-01 | Source: Ambulatory Visit

## 2024-03-01 ENCOUNTER — Encounter (HOSPITAL_COMMUNITY): Payer: Self-pay | Admitting: Internal Medicine

## 2024-03-01 ENCOUNTER — Observation Stay (HOSPITAL_COMMUNITY): Admitting: Certified Registered"

## 2024-03-01 ENCOUNTER — Encounter (HOSPITAL_COMMUNITY)
Admission: EM | Disposition: A | Discharge status: Home / Self Care | Payer: Self-pay | Source: Home / Self Care | Attending: Internal Medicine

## 2024-03-01 DIAGNOSIS — K31819 Angiodysplasia of stomach and duodenum without bleeding: Secondary | ICD-10-CM

## 2024-03-01 DIAGNOSIS — K264 Chronic or unspecified duodenal ulcer with hemorrhage: Secondary | ICD-10-CM | POA: Diagnosis present

## 2024-03-01 DIAGNOSIS — E876 Hypokalemia: Secondary | ICD-10-CM | POA: Diagnosis not present

## 2024-03-01 DIAGNOSIS — K269 Duodenal ulcer, unspecified as acute or chronic, without hemorrhage or perforation: Secondary | ICD-10-CM

## 2024-03-01 DIAGNOSIS — K222 Esophageal obstruction: Secondary | ICD-10-CM | POA: Diagnosis present

## 2024-03-01 DIAGNOSIS — Z8249 Family history of ischemic heart disease and other diseases of the circulatory system: Secondary | ICD-10-CM | POA: Diagnosis not present

## 2024-03-01 DIAGNOSIS — M1990 Unspecified osteoarthritis, unspecified site: Secondary | ICD-10-CM | POA: Diagnosis present

## 2024-03-01 DIAGNOSIS — D62 Acute posthemorrhagic anemia: Secondary | ICD-10-CM

## 2024-03-01 DIAGNOSIS — Z79899 Other long term (current) drug therapy: Secondary | ICD-10-CM | POA: Diagnosis not present

## 2024-03-01 DIAGNOSIS — I129 Hypertensive chronic kidney disease with stage 1 through stage 4 chronic kidney disease, or unspecified chronic kidney disease: Secondary | ICD-10-CM | POA: Diagnosis present

## 2024-03-01 DIAGNOSIS — D75839 Thrombocytosis, unspecified: Secondary | ICD-10-CM | POA: Diagnosis present

## 2024-03-01 DIAGNOSIS — K5730 Diverticulosis of large intestine without perforation or abscess without bleeding: Secondary | ICD-10-CM | POA: Diagnosis present

## 2024-03-01 DIAGNOSIS — E781 Pure hyperglyceridemia: Secondary | ICD-10-CM | POA: Diagnosis present

## 2024-03-01 DIAGNOSIS — K2950 Unspecified chronic gastritis without bleeding: Secondary | ICD-10-CM | POA: Diagnosis not present

## 2024-03-01 DIAGNOSIS — Z886 Allergy status to analgesic agent status: Secondary | ICD-10-CM | POA: Diagnosis not present

## 2024-03-01 DIAGNOSIS — K31811 Angiodysplasia of stomach and duodenum with bleeding: Secondary | ICD-10-CM | POA: Diagnosis present

## 2024-03-01 DIAGNOSIS — K922 Gastrointestinal hemorrhage, unspecified: Secondary | ICD-10-CM | POA: Diagnosis not present

## 2024-03-01 DIAGNOSIS — Z7982 Long term (current) use of aspirin: Secondary | ICD-10-CM | POA: Diagnosis not present

## 2024-03-01 DIAGNOSIS — D72829 Elevated white blood cell count, unspecified: Secondary | ICD-10-CM | POA: Diagnosis present

## 2024-03-01 DIAGNOSIS — F17210 Nicotine dependence, cigarettes, uncomplicated: Secondary | ICD-10-CM | POA: Diagnosis present

## 2024-03-01 DIAGNOSIS — Z952 Presence of prosthetic heart valve: Secondary | ICD-10-CM | POA: Diagnosis not present

## 2024-03-01 DIAGNOSIS — E872 Acidosis, unspecified: Secondary | ICD-10-CM | POA: Diagnosis not present

## 2024-03-01 DIAGNOSIS — J449 Chronic obstructive pulmonary disease, unspecified: Secondary | ICD-10-CM | POA: Diagnosis present

## 2024-03-01 DIAGNOSIS — K2980 Duodenitis without bleeding: Secondary | ICD-10-CM

## 2024-03-01 DIAGNOSIS — N182 Chronic kidney disease, stage 2 (mild): Secondary | ICD-10-CM | POA: Diagnosis present

## 2024-03-01 DIAGNOSIS — G894 Chronic pain syndrome: Secondary | ICD-10-CM | POA: Diagnosis present

## 2024-03-01 DIAGNOSIS — Z1152 Encounter for screening for COVID-19: Secondary | ICD-10-CM | POA: Diagnosis not present

## 2024-03-01 DIAGNOSIS — I959 Hypotension, unspecified: Secondary | ICD-10-CM | POA: Diagnosis present

## 2024-03-01 DIAGNOSIS — F1120 Opioid dependence, uncomplicated: Secondary | ICD-10-CM | POA: Diagnosis present

## 2024-03-01 DIAGNOSIS — F419 Anxiety disorder, unspecified: Secondary | ICD-10-CM | POA: Diagnosis present

## 2024-03-01 HISTORY — PX: ESOPHAGOGASTRODUODENOSCOPY: SHX5428

## 2024-03-01 LAB — GASTROINTESTINAL PANEL BY PCR, STOOL (REPLACES STOOL CULTURE)

## 2024-03-01 LAB — TYPE AND SCREEN
ABO/RH(D): O POS
Antibody Screen: NEGATIVE
Unit division: 0
Unit division: 0
Unit division: 0

## 2024-03-01 LAB — COMPREHENSIVE METABOLIC PANEL WITH GFR
ALT: 14 U/L (ref 0–44)
AST: 20 U/L (ref 15–41)
Albumin: 2.5 g/dL — ABNORMAL LOW (ref 3.5–5.0)
Alkaline Phosphatase: 41 U/L (ref 38–126)
Anion gap: 5 (ref 5–15)
BUN: 14 mg/dL (ref 8–23)
CO2: 21 mmol/L — ABNORMAL LOW (ref 22–32)
Calcium: 8.6 mg/dL — ABNORMAL LOW (ref 8.9–10.3)
Chloride: 114 mmol/L — ABNORMAL HIGH (ref 98–111)
Creatinine, Ser: 0.77 mg/dL (ref 0.61–1.24)
GFR, Estimated: >60 mL/min (ref >60)
Glucose, Bld: 90 mg/dL (ref 70–99)
Potassium: 3.4 mmol/L — ABNORMAL LOW (ref 3.5–5.1)
Sodium: 140 mmol/L (ref 135–145)
Total Bilirubin: 0.4 mg/dL (ref 0.0–1.2)
Total Protein: 5.1 g/dL — ABNORMAL LOW (ref 6.5–8.1)

## 2024-03-01 LAB — BPAM RBC
Blood Product Expiration Date: 202510112359
Blood Product Expiration Date: 202510112359
Blood Product Expiration Date: 202510142359
ISSUE DATE / TIME: 202509170939
ISSUE DATE / TIME: 202509171200
ISSUE DATE / TIME: 202509171533
Unit Type and Rh: 5100
Unit Type and Rh: 5100
Unit Type and Rh: 5100

## 2024-03-01 LAB — MAGNESIUM: Magnesium: 1.7 mg/dL (ref 1.7–2.4)

## 2024-03-01 LAB — CBC
HCT: 26.6 % — ABNORMAL LOW (ref 39.0–52.0)
Hemoglobin: 9 g/dL — ABNORMAL LOW (ref 13.0–17.0)
MCH: 29.6 pg (ref 26.0–34.0)
MCHC: 33.8 g/dL (ref 30.0–36.0)
MCV: 87.5 fL (ref 80.0–100.0)
Platelets: 279 K/uL (ref 150–400)
RBC: 3.04 MIL/uL — ABNORMAL LOW (ref 4.22–5.81)
RDW: 17.2 % — ABNORMAL HIGH (ref 11.5–15.5)
WBC: 17.8 K/uL — ABNORMAL HIGH (ref 4.0–10.5)
nRBC: 0.4 % — ABNORMAL HIGH (ref 0.0–0.2)

## 2024-03-01 LAB — C DIFFICILE QUICK SCREEN W PCR REFLEX
C Diff antigen: NEGATIVE
C Diff interpretation: NOT DETECTED
C Diff toxin: NEGATIVE

## 2024-03-01 LAB — HIV ANTIBODY (ROUTINE TESTING W REFLEX): HIV Screen 4th Generation wRfx: NONREACTIVE

## 2024-03-01 SURGERY — EGD (ESOPHAGOGASTRODUODENOSCOPY)
Anesthesia: General

## 2024-03-01 MED ORDER — VASOPRESSIN 20 UNIT/ML IV SOLN
INTRAVENOUS | Status: DC | PRN
Start: 2024-03-01 — End: 2024-03-01
  Administered 2024-03-01 (×2): 1 [IU] via INTRAVENOUS

## 2024-03-01 MED ORDER — LACTATED RINGERS IV SOLN: INTRAVENOUS | Status: DC | PRN

## 2024-03-01 MED ORDER — PROPOFOL 10 MG/ML IV BOLUS
INTRAVENOUS | Status: DC | PRN
  Administered 2024-03-01 (×2): 30 mg via INTRAVENOUS
  Administered 2024-03-01: 70 mg via INTRAVENOUS
  Administered 2024-03-01 (×2): 30 mg via INTRAVENOUS

## 2024-03-01 MED ORDER — SODIUM CHLORIDE 0.9 % IV SOLN
INTRAVENOUS | Status: AC
Start: 2024-03-01 — End: 2024-03-01

## 2024-03-01 NOTE — Progress Notes (Signed)
 PROGRESS NOTE    Russell [name]  FMW:[id number] DOB: 1955/10/14 DOA: 02/29/2024 PCP: [name], MD   Brief Narrative:  67 y.o. male with medical history significant of hypertension, COPD, sleep apnea, aortic stenosis status post aortic valve replacement, hyperlipidemia, GAD, chronic pain syndrome on chronic opiates and tobacco abuse presented with fatigue, nausea, diarrhea and some dark stools.  On presentation, he was hypotensive, tachycardic and tachypneic but afebrile. Hemoglobin 5.4 (prior hemoglobin on 01/27/2024 was 13.5).  FOBT positive.  He was started on IV fluids and on IV Protonix .  3 units packed red cells were transfused.  GI was consulted.  Assessment & Plan:   Acute blood loss anemia Possible upper GI bleeding -Hemoglobin 5.4 on presentation (prior hemoglobin on 01/27/2024 was 13.5).  Status post 3 units packed red cells transfusion.  Hemoglobin 9 today.  Continue H&H monitoring.  Continue IV Protonix .   -GI following and planning for possible EGD today - Baby aspirin  held.  Continue IV fluids   Leukocytosis - Possibly reactive from above.  Patient presented with diarrhea.  Stool for C. difficile/GI pathogen is pending.  No need for antibiotics at this time.  Improving.  Monitor.  Hypokalemia - Mild.  Monitor  Metabolic acidosis -Mild.  Monitor.   Thrombocytosis - Possibly reactive.  Monitor   Essential hypertension - Monitor blood pressure.  Hold antihypertensives for now.   Hyperlipidemia - Outpatient follow-up.  Resume fenofibrate once able to take orally   Chronic pain with opiate dependence - Continue current pain management.  Outpatient follow-up with PCP/pain management   DVT prophylaxis: SCDs Code Status: Full Family Communication: None at bedside Disposition Plan: Status is: Observation The patient will require care spanning > 2 midnights and should be moved to inpatient because: Of severity of illness    Consultants: GI  Procedures:  None  Antimicrobials: None currently   Subjective: Patient seen and examined at bedside.  Complains of neck pain.  No fever, vomiting, abdominal pain reported.  Objective: Vitals:   03/01/24 0600 03/01/24 0700 03/01/24 0748 03/01/24 0800  BP: 114/71 122/72  118/72  Pulse: 66 75  70  Resp: 12 14  17   Temp:   98.3 F (36.8 C)   TempSrc:   Oral   SpO2: 96% 96%  97%  Weight:      Height:        Intake/Output Summary (Last 24 hours) at 03/01/2024 0834 Last data filed at 03/01/2024 0720 Gross per 24 hour  Intake 2505.64 ml  Output 1750 ml  Net 755.64 ml   Filed Weights   02/29/24 0749 02/29/24 1051 03/01/24 0500  Weight: 63.2 kg 63.8 kg 63.7 kg    Examination:  General exam: Appears calm and comfortable. Respiratory system: Bilateral decreased breath sounds at bases, no wheezing Cardiovascular system: S1 & S2 heard, Rate controlled Gastrointestinal system: Abdomen is nondistended, soft and nontender. Normal bowel sounds heard. Extremities: No cyanosis, clubbing, edema  Central nervous system: Alert and oriented.  Remains slow to respond and a poor historian.  No focal neurological deficits. Moving extremities Skin: No rashes, lesions or ulcers Psychiatry: Mostly flat affect.  Currently not agitated.   Data Reviewed: I have personally reviewed following labs and imaging studies  CBC: Recent Labs  Lab 02/29/24 0801 02/29/24 2145 03/01/24 0321  WBC 24.1*  --  17.8*  NEUTROABS 19.8*  --   --   HGB 5.4* 9.0* 9.0*  HCT 17.0* 26.3* 26.6*  MCV 95.5  --  87.5  PLT 404*  --  279   Basic Metabolic Panel: Recent Labs  Lab 02/29/24 0801 03/01/24 0321  NA 136 140  K 3.7 3.4*  CL 106 114*  CO2 22 21*  GLUCOSE 135* 90  BUN 43* 14  CREATININE 0.84 0.77  CALCIUM  8.8* 8.6*  MG  --  1.7   GFR: Estimated Creatinine Clearance: 74.7 mL/min (by C-G formula based on SCr of 0.77 mg/dL). Liver Function Tests: Recent Labs  Lab 02/29/24 0801 03/01/24 0321  AST 17 20   ALT 14 14  ALKPHOS 49 41  BILITOT 0.3 0.4  PROT 6.0* 5.1*  ALBUMIN  2.9* 2.5*   No results for input(s): LIPASE, AMYLASE in the last 168 hours. No results for input(s): AMMONIA in the last 168 hours. Coagulation Profile: Recent Labs  Lab 02/29/24 0801  INR 1.1   Cardiac Enzymes: No results for input(s): CKTOTAL, CKMB, CKMBINDEX, TROPONINI in the last 168 hours. BNP (last 3 results) No results for input(s): PROBNP in the last 8760 hours. HbA1C: No results for input(s): HGBA1C in the last 72 hours. CBG: No results for input(s): GLUCAP in the last 168 hours. Lipid Profile: No results for input(s): CHOL, HDL, LDLCALC, TRIG, CHOLHDL, LDLDIRECT in the last 72 hours. Thyroid Function Tests: No results for input(s): TSH, T4TOTAL, FREET4, T3FREE, THYROIDAB in the last 72 hours. Anemia Panel: No results for input(s): VITAMINB12, FOLATE, FERRITIN, TIBC, IRON , RETICCTPCT in the last 72 hours. Sepsis Labs: Recent Labs  Lab 02/29/24 0801 02/29/24 1025  LATICACIDVEN 1.2 0.9    Recent Results (from the past 240 hours)  Blood Culture (routine x 2)     Status: None (Preliminary result)   Collection Time: 02/29/24  8:01 AM   Specimen: BLOOD  Result Value Ref Range Status   Specimen Description BLOOD BLOOD LEFT ARM  Final   Special Requests   Final    BOTTLES DRAWN AEROBIC AND ANAEROBIC Blood Culture adequate volume   Culture   Final    NO GROWTH < 24 HOURS Performed at [facility], [address], [place], [place] [address]    Report Status PENDING  Incomplete  Blood Culture (routine x 2)     Status: None (Preliminary result)   Collection Time: 02/29/24  8:12 AM   Specimen: BLOOD  Result Value Ref Range Status   Specimen Description BLOOD RIGHT ANTECUBITAL  Final   Special Requests   Final    BOTTLES DRAWN AEROBIC AND ANAEROBIC Blood Culture results may not be optimal due to an inadequate volume of blood received in culture  bottles   Culture   Final    NO GROWTH < 24 HOURS Performed at [facility], [address], [place], [place] [address]    Report Status PENDING  Incomplete  Resp panel by RT-PCR (RSV, Flu A&B, Covid) Anterior Nasal Swab     Status: None   Collection Time: 02/29/24  8:55 AM   Specimen: Anterior Nasal Swab  Result Value Ref Range Status   SARS Coronavirus 2 by RT PCR NEGATIVE NEGATIVE Final    Comment: (NOTE) SARS-CoV-2 target nucleic acids are NOT DETECTED.  The SARS-CoV-2 RNA is generally detectable in upper respiratory specimens during the acute phase of infection. The lowest concentration of SARS-CoV-2 viral copies this assay can detect is 138 copies/mL. A negative result does not preclude SARS-Cov-2 infection and should not be used as the sole basis for treatment or other patient management decisions. A negative result may occur with  improper specimen collection/handling,  submission of specimen other than nasopharyngeal swab, presence of viral mutation(s) within the areas targeted by this assay, and inadequate number of viral copies(<138 copies/mL). A negative result must be combined with clinical observations, patient history, and epidemiological information. The expected result is Negative.  Fact Sheet for Patients:  BloggerCourse.com  Fact Sheet for Healthcare Providers:  SeriousBroker.it  This test is no t yet approved or cleared by the [place]  FDA and  has been authorized for detection and/or diagnosis of SARS-CoV-2 by FDA under an Emergency Use Authorization (EUA). This EUA will remain  in effect (meaning this test can be used) for the duration of the COVID-19 declaration under Section 564(b)(1) of the Act, 21 U.S.C.section 360bbb-3(b)(1), unless the authorization is terminated  or revoked sooner.       Influenza A by PCR NEGATIVE NEGATIVE Final   Influenza B by PCR NEGATIVE NEGATIVE Final    Comment:  (NOTE) The Xpert Xpress SARS-CoV-2/FLU/RSV plus assay is intended as an aid in the diagnosis of influenza from Nasopharyngeal swab specimens and should not be used as a sole basis for treatment. Nasal washings and aspirates are unacceptable for Xpert Xpress SARS-CoV-2/FLU/RSV testing.  Fact Sheet for Patients: BloggerCourse.com  Fact Sheet for Healthcare Providers: SeriousBroker.it  This test is not yet approved or cleared by the [place]  FDA and has been authorized for detection and/or diagnosis of SARS-CoV-2 by FDA under an Emergency Use Authorization (EUA). This EUA will remain in effect (meaning this test can be used) for the duration of the COVID-19 declaration under Section 564(b)(1) of the Act, 21 U.S.C. section 360bbb-3(b)(1), unless the authorization is terminated or revoked.     Resp Syncytial Virus by PCR NEGATIVE NEGATIVE Final    Comment: (NOTE) Fact Sheet for Patients: BloggerCourse.com  Fact Sheet for Healthcare Providers: SeriousBroker.it  This test is not yet approved or cleared by the [place]  FDA and has been authorized for detection and/or diagnosis of SARS-CoV-2 by FDA under an Emergency Use Authorization (EUA). This EUA will remain in effect (meaning this test can be used) for the duration of the COVID-19 declaration under Section 564(b)(1) of the Act, 21 U.S.C. section 360bbb-3(b)(1), unless the authorization is terminated or revoked.  Performed at [facility], [address] [place], [place] [address]   MRSA Next Gen by PCR, Nasal     Status: None   Collection Time: 02/29/24 10:40 AM   Specimen: Nasal Mucosa; Nasal Swab  Result Value Ref Range Status   MRSA by PCR Next Gen NOT DETECTED NOT DETECTED Final    Comment: (NOTE) The GeneXpert MRSA Assay (FDA approved for NASAL specimens only), is one component of a comprehensive MRSA  colonization surveillance program. It is not intended to diagnose MRSA infection nor to guide or monitor treatment for MRSA infections. Test performance is not FDA approved in patients less than 37 years old. Performed at [facility], [address] [place], [place] [address]          Radiology Studies: [facility] Chest [facility] 1 View Result Date: 02/29/2024 CLINICAL DATA:  Sepsis.  Nausea and diarrhea. EXAM: PORTABLE CHEST 1 VIEW COMPARISON:  08/28/2014 FINDINGS: Lungs are hyperexpanded. Biapical pleuroparenchymal scarring evident. Streaky density in the left suprahilar region is stable. No focal consolidation, edema, or substantial pleural effusion. The cardiopericardial silhouette is within normal limits for size. No acute bony abnormality. Telemetry leads overlie the chest. IMPRESSION: Hyperexpansion with biapical pleuroparenchymal scarring. No acute cardiopulmonary findings. Electronically Signed   By: Camellia Candle  M.D.   On: 02/29/2024 08:12        Scheduled Meds:  Chlorhexidine  Gluconate Cloth  6 each Topical Q0600   feeding supplement  1 Container Oral TID BM   nicotine   14 mg Transdermal Daily   pantoprazole  (PROTONIX ) IV  40 mg Intravenous Q12H   Continuous Infusions:  sodium chloride  75 mL/hr at 03/01/24 0720          Sophie Mao, MD [facility] 03/01/2024, 8:34 AM

## 2024-03-01 NOTE — Plan of Care (Signed)

## 2024-03-01 NOTE — Progress Notes (Signed)
  Transition of Care [facility]) Screening Note   Patient Details  Name: Russell [name] Date of Birth: [birth year]   Transition of Care [facility]) CM/SW Contact:    Hoy DELENA Bigness, LCSW Phone Number: 03/01/2024, 9:30 AM   Transition of Care Department [facility]) has reviewed patient and no TOC needs have been identified at this time. We will continue to monitor patient advancement through interdisciplinary progression rounds. If new patient transition needs arise, please place a TOC consult.    03/01/24 0930  TOC Brief Assessment  Insurance and Status Reviewed  Patient has primary care physician Yes  Home environment has been reviewed Single family home w/ spouse  Prior level of function: Independent  Prior/Current Home Services No current home services  Social Drivers of Health Review SDOH reviewed no interventions necessary  Readmission risk has been reviewed Yes  Transition of care needs no transition of care needs at this time

## 2024-03-01 NOTE — Transfer of Care (Signed)
 Immediate Anesthesia Transfer of Care Note  Patient: Russell [name]  Procedure(s) Performed: EGD (ESOPHAGOGASTRODUODENOSCOPY)  Patient Location: PACU  Anesthesia Type:General  Level of Consciousness: awake  Airway & Oxygen Therapy: Patient Spontanous Breathing  Post-op Assessment: Report given to RN and Post -op Vital signs reviewed and stable  Post vital signs: Reviewed and stable  Last Vitals:  Vitals Value Taken Time  BP 105/67   Temp    Pulse 66 03/01/24 12:06  Resp 17 03/01/24 12:06  SpO2 93 % 03/01/24 12:06  Vitals shown include unfiled device data.  Last Pain:  Vitals:   03/01/24 1132  TempSrc:   PainSc: 8          Complications: No notable events documented.

## 2024-03-01 NOTE — ED Provider Notes (Signed)
 [facility] INT[facility] UNIT Provider Note   CSN: [id number] Arrival date & time: 02/29/24  0740     Patient presents with: Hypotension   Russell [name] is a 68 y.o. male.   Patient presents with weakness and dark stools for a few days.  Patient has a history of hypertension.  And he also takes aspirin   The history is provided by the patient and medical records. No language interpreter was used.  Weakness Severity:  Moderate Onset quality:  Sudden Timing:  Constant Progression:  Worsening Chronicity:  New Context: not alcohol use   Relieved by:  Nothing Worsened by:  Nothing Ineffective treatments:  None tried Associated symptoms: no abdominal pain, no chest pain, no cough, no diarrhea, no frequency, no headaches and no seizures        Prior to Admission medications   Medication Sig Start Date End Date Taking? Authorizing Provider  albuterol  (PROVENTIL  HFA;VENTOLIN  HFA) 108 (90 BASE) MCG/ACT inhaler Inhale 1-2 puffs into the lungs every 6 (six) hours as needed for wheezing or shortness of breath.   Yes [provider]  ALPRAZolam  (XANAX ) 1 MG tablet Take 0.5-1 tablets (0.5-1 mg total) by mouth 3 (three) times daily as needed for anxiety. 08/10/23  Yes [name], MD  amLODipine  (NORVASC ) 5 MG tablet Take 1 tablet (5 mg total) by mouth daily. 08/10/23  Yes [name], MD  aspirin  EC 81 MG tablet Take 1 tablet (81 mg total) by mouth daily. 09/24/14  Yes Charls Pearla LABOR, MD  carvedilol  (COREG ) 12.5 MG tablet Take 1 tablet (12.5 mg total) by mouth 2 (two) times daily. 02/14/24  Yes [name], MD  fenofibrate 54 MG tablet TAKE 1 TABLET BY MOUTH DAILY FOR HIGH TRIGLYCERIDES. Patient taking differently: Take 54 mg by mouth daily. 09/09/23  Yes [name], MD  ferrous gluconate  (FERGON) 324 MG tablet Take 1 tablet (324 mg total) by mouth 2 (two) times daily with a meal. 06/24/14  Yes Collins, Gina L, PA-C  furosemide  (LASIX ) 20 MG tablet TAKE ONE  TABLET BY MOUTH EVERY DAY 02/23/24  Yes [name], MD  gabapentin  (NEURONTIN ) 300 MG capsule Take 1 capsule (300 mg total) by mouth 2 (two) times daily. 11/08/23  Yes [name], MD  HYDROcodone -acetaminophen  (NORCO) 10-325 MG tablet TAKE ONE TABLET BY MOUTH EVERY 6 HOURS AS NEEDED Patient taking differently: Take 1 tablet by mouth every 6 (six) hours as needed for moderate pain (pain score 4-6). 02/27/24  Yes [name], MD  lisinopril  (ZESTRIL ) 40 MG tablet TAKE 1 TABLET BY MOUTH ONCE DAILY. 02/03/24  Yes [name], MD  Multiple Vitamin (MULTI-VITAMIN PO) Take 1 tablet by mouth 2 (two) times daily.    Yes [provider]  potassium chloride  SA (KLOR-CON  M) 20 MEQ tablet Take 20 mEq by mouth daily.   Yes [provider]    Allergies: Asa [aspirin ] and Prozac [fluoxetine hcl]    Review of Systems  Constitutional:  Negative for appetite change and fatigue.  HENT:  Negative for congestion, ear discharge and sinus pressure.   Eyes:  Negative for discharge.  Respiratory:  Negative for cough.   Cardiovascular:  Negative for chest pain.  Gastrointestinal:  Negative for abdominal pain and diarrhea.  Genitourinary:  Negative for frequency and hematuria.  Musculoskeletal:  Negative for back pain.  Skin:  Negative for rash.  Neurological:  Positive for weakness. Negative for seizures and headaches.  Psychiatric/Behavioral:  Negative  for hallucinations.     Updated Vital Signs BP (!) 144/83   Pulse 96   Temp 98.3 F (36.8 C) (Oral)   Resp 17   Ht 5' 5 (1.651 m)   Wt 63.7 kg   SpO2 99%   BMI 23.37 kg/m   Physical Exam Vitals and nursing note reviewed.  Constitutional:      Appearance: He is well-developed.  HENT:     Head: Normocephalic.     Nose: Nose normal.  Eyes:     General: No scleral icterus.    Conjunctiva/sclera: Conjunctivae normal.  Neck:     Thyroid: No thyromegaly.  Cardiovascular:     Rate and Rhythm: Normal rate and regular  rhythm.     Heart sounds: No murmur heard.    No friction rub. No gallop.  Pulmonary:     Breath sounds: No stridor. No wheezing or rales.  Chest:     Chest wall: No tenderness.  Abdominal:     General: There is no distension.     Tenderness: There is no abdominal tenderness. There is no rebound.  Genitourinary:    Comments: Dark stool heme positive Musculoskeletal:        General: Normal range of motion.     Cervical back: Neck supple.  Lymphadenopathy:     Cervical: No cervical adenopathy.  Skin:    Findings: No erythema or rash.  Neurological:     Mental Status: He is alert and oriented to person, place, and time.     Motor: No abnormal muscle tone.     Coordination: Coordination normal.  Psychiatric:        Behavior: Behavior normal.     (all labs ordered are listed, but only abnormal results are displayed) Labs Reviewed  COMPREHENSIVE METABOLIC PANEL WITH GFR - Abnormal; Notable for the following components:      Result Value   Glucose, Bld 135 (*)    BUN 43 (*)    Calcium  8.8 (*)    Total Protein 6.0 (*)    Albumin  2.9 (*)    All other components within normal limits  CBC WITH DIFFERENTIAL/PLATELET - Abnormal; Notable for the following components:   WBC 24.1 (*)    RBC 1.78 (*)    Hemoglobin 5.4 (*)    HCT 17.0 (*)    Platelets 404 (*)    Neutro Abs 19.8 (*)    Abs Immature Granulocytes 0.25 (*)    All other components within normal limits  URINALYSIS, W/ REFLEX TO CULTURE (INFECTION SUSPECTED) - Abnormal; Notable for the following components:   Color, Urine STRAW (*)    All other components within normal limits  HEMOGLOBIN AND HEMATOCRIT, BLOOD - Abnormal; Notable for the following components:   Hemoglobin 9.0 (*)    HCT 26.3 (*)    All other components within normal limits  CBC - Abnormal; Notable for the following components:   WBC 17.8 (*)    RBC 3.04 (*)    Hemoglobin 9.0 (*)    HCT 26.6 (*)    RDW 17.2 (*)    nRBC 0.4 (*)    All other  components within normal limits  COMPREHENSIVE METABOLIC PANEL WITH GFR - Abnormal; Notable for the following components:   Potassium 3.4 (*)    Chloride 114 (*)    CO2 21 (*)    Calcium  8.6 (*)    Total Protein 5.1 (*)    Albumin  2.5 (*)    All other components within  normal limits  POC OCCULT BLOOD, ED - Abnormal  CULTURE, BLOOD (ROUTINE X 2)  CULTURE, BLOOD (ROUTINE X 2)  C DIFFICILE QUICK SCREEN W PCR REFLEX    RESP PANEL BY RT-PCR (RSV, FLU A&B, COVID)  RVPGX2  MRSA NEXT GEN BY PCR, NASAL  GASTROINTESTINAL PANEL BY PCR, STOOL (REPLACES STOOL CULTURE)  LACTIC ACID, PLASMA  LACTIC ACID, PLASMA  PROTIME-INR  HIV ANTIBODY (ROUTINE TESTING W REFLEX)  MAGNESIUM   CBC WITH DIFFERENTIAL/PLATELET  BASIC METABOLIC PANEL WITH GFR  MAGNESIUM   PREPARE RBC (CROSSMATCH)  TYPE AND SCREEN  SURGICAL PATHOLOGY    EKG: EKG Interpretation Date/Time:  Wednesday February 29 2024 07:51:44 EDT Ventricular Rate:  101 PR Interval:  175 QRS Duration:  93 QT Interval:  353 QTC Calculation: 458 R Axis:   70  Text Interpretation: Sinus tachycardia Probable left ventricular hypertrophy Confirmed by Tonia Chew [phone]) on 03/01/2024 10:14:01 AM  Radiology: [facility] Chest Port 1 View Result Date: 02/29/2024 CLINICAL DATA:  Sepsis.  Nausea and diarrhea. EXAM: PORTABLE CHEST 1 VIEW COMPARISON:  08/28/2014 FINDINGS: Lungs are hyperexpanded. Biapical pleuroparenchymal scarring evident. Streaky density in the left suprahilar region is stable. No focal consolidation, edema, or substantial pleural effusion. The cardiopericardial silhouette is within normal limits for size. No acute bony abnormality. Telemetry leads overlie the chest. IMPRESSION: Hyperexpansion with biapical pleuroparenchymal scarring. No acute cardiopulmonary findings. Electronically Signed   By: Camellia Candle M.D.   On: 02/29/2024 08:12   CRITICAL CARE Performed by: Fairy Sermon Total critical care time: 45 minutes Critical care time was  exclusive of separately billable procedures and treating other patients. Critical care was necessary to treat or prevent imminent or life-threatening deterioration. Critical care was time spent personally by me on the following activities: development of treatment plan with patient and/or surrogate as well as nursing, discussions with consultants, evaluation of patient's response to treatment, examination of patient, obtaining history from patient or surrogate, ordering and performing treatments and interventions, ordering and review of laboratory studies, ordering and review of radiographic studies, pulse oximetry and re-evaluation of patient's condition.]   Procedures   Medications Ordered in the ED  Chlorhexidine  Gluconate Cloth 2 % PADS 6 each (6 each Topical Given 03/01/24 1433)  acetaminophen  (TYLENOL ) tablet 650 mg ( Oral MAR Unhold 03/01/24 1311)    Or  acetaminophen  (TYLENOL ) suppository 650 mg ( Rectal MAR Unhold 03/01/24 1311)  ondansetron  (ZOFRAN ) tablet 4 mg ( Oral MAR Unhold 03/01/24 1311)    Or  ondansetron  (ZOFRAN ) injection 4 mg ( Intravenous MAR Unhold 03/01/24 1311)  albuterol  (PROVENTIL ) (2.5 MG/3ML) 0.083% nebulizer solution 2.5 mg ( Nebulization MAR Unhold 03/01/24 1311)  hydrALAZINE  (APRESOLINE ) injection 5 mg ( Intravenous MAR Unhold 03/01/24 1311)  morphine  (PF) 2 MG/ML injection 2 mg (2 mg Intravenous Given 03/01/24 1453)  pantoprazole  (PROTONIX ) injection 40 mg ( Intravenous MAR Unhold 03/01/24 1311)  HYDROcodone -acetaminophen  (NORCO) 10-325 MG per tablet 1 tablet ( Oral MAR Unhold 03/01/24 1311)  feeding supplement (BOOST / RESOURCE BREEZE) liquid 1 Container (1 Container Oral Given 03/01/24 1421)  ALPRAZolam  (XANAX ) tablet 0.25 mg ( Oral MAR Unhold 03/01/24 1311)  nicotine  (NICODERM CQ  - dosed in mg/24 hours) patch 14 mg ( Transdermal MAR Unhold 03/01/24 1311)  0.9 %  sodium chloride  infusion ( Intravenous New Bag/Given 03/01/24 1430)  cefTRIAXone  (ROCEPHIN ) 2 g in sodium  chloride 0.9 % 100 mL IVPB (0 g Intravenous Stopped 02/29/24 0849)  metroNIDAZOLE  (FLAGYL ) IVPB 500 mg (0 mg Intravenous Stopped 02/29/24 0955)  sodium chloride   0.9 % bolus 2,000 mL (2,000 mLs Intravenous New Bag/Given 02/29/24 0816)  0.9 %  sodium chloride  infusion (Manually program via Guardrails IV Fluids) (0 mLs Intravenous Stopped 02/29/24 0948)  pantoprazole  (PROTONIX ) injection 40 mg (40 mg Intravenous Given 02/29/24 0830)                                    Medical Decision Making Amount and/or Complexity of Data Reviewed Labs: ordered. Radiology: ordered.  Risk Prescription drug management. Decision regarding hospitalization.   Patient is admitted for anemia and upper GI bleed.  He was transfused 3 units of packed cells.  He will be admitted to medicine with GI consult     Final diagnoses:  None    ED Discharge Orders     None          Suzette Pac, MD 03/01/24 1758

## 2024-03-01 NOTE — Care Management Obs Status (Signed)
 MEDICARE OBSERVATION STATUS NOTIFICATION   Patient Details  Name: Russell [name] MRN: [id number] Date of Birth: May 15, 1956   Medicare Observation Status Notification Given:  Yes    Duwaine LITTIE Ada 03/01/2024, 11:46 AM

## 2024-03-01 NOTE — Interval H&P Note (Signed)
 History and Physical Interval Note:  03/01/2024 11:27 AM  Russell [name]  has presented today for surgery, with the diagnosis of melena, anemia.  The various methods of treatment have been discussed with the patient and family. After consideration of risks, benefits and other options for treatment, the patient has consented to  Procedure(s): EGD (ESOPHAGOGASTRODUODENOSCOPY) (N/A) as a surgical intervention.  The patient's history has been reviewed, patient examined, no change in status, stable for surgery.  I have reviewed the patient's chart and labs.  Questions were answered to the patient's satisfaction.    I thoroughly discussed with the patient the procedure, including the risks involved. Patient understands what the procedure involves including the benefits and any risks. Patient understands alternatives to the proposed procedure. Risks including (but not limited to) bleeding, tearing of the lining (perforation), rupture of adjacent organs, problems with heart and lung function, infection, and medication reactions. A small percentage of complications may require surgery, hospitalization, repeat endoscopic procedure, and/or transfusion.  Patient understood and agreed.   [name] [name]

## 2024-03-01 NOTE — Brief Op Note (Signed)
 Patient underwent EGD  under propofol  sedation.  Tolerated the procedure adequately.   FINDINGS:  - Non- obstructing Schatzki ring. 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 - Non- bleeding duodenal ulcers with a clean ulcer base ( Forrest Class III) . Biopsied. 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 Treated with argon plasma coagulation ( APC) .  RECOMMENDATIONS  - Advance diet as tolerated.  - Await pathology results.  - continue PPI daily for 6- 8 weeks  - Avoid NSAIDS except Aspirin  81 mg  - If further bleeding is encountered or drop in HBG may need capsule endoscopy as patient may have more small bowel angioectasias not examined  Jaclin Finks Faizan Teryl Gubler, MD Gastroenterology and Hepatology [facility]

## 2024-03-01 NOTE — Anesthesia Preprocedure Evaluation (Signed)
 Anesthesia Evaluation  Patient identified by MRN, date of birth, ID band Patient awake    Reviewed: Allergy & Precautions, H&P , NPO status , Patient's Chart, lab work & pertinent test results, reviewed documented beta blocker date and time   Airway Mallampati: II  TM Distance: >3 FB Neck ROM: full    Dental no notable dental hx.    Pulmonary neg pulmonary ROS, shortness of breath, sleep apnea , COPD, Current Smoker and Patient abstained from smoking.   Pulmonary exam normal breath sounds clear to auscultation       Cardiovascular Exercise Tolerance: Good hypertension, negative cardio ROS  Rhythm:regular Rate:Normal     Neuro/Psych  PSYCHIATRIC DISORDERS Anxiety      Neuromuscular disease negative neurological ROS  negative psych ROS   GI/Hepatic negative GI ROS, Neg liver ROS,,,  Endo/Other  negative endocrine ROS    Renal/GU Renal diseasenegative Renal ROS  negative genitourinary   Musculoskeletal   Abdominal   Peds  Hematology negative hematology ROS (+) Blood dyscrasia, anemia   Anesthesia Other Findings   Reproductive/Obstetrics negative OB ROS                              Anesthesia Physical Anesthesia Plan  ASA: 3 and emergent  Anesthesia Plan: General   Post-op Pain Management:    Induction:   PONV Risk Score and Plan: Propofol  infusion  Airway Management Planned:   Additional Equipment:   Intra-op Plan:   Post-operative Plan:   Informed Consent: I have reviewed the patients History and Physical, chart, labs and discussed the procedure including the risks, benefits and alternatives for the proposed anesthesia with the patient or authorized representative who has indicated his/her understanding and acceptance.     Dental Advisory Given  Plan Discussed with: CRNA  Anesthesia Plan Comments:         Anesthesia Quick Evaluation

## 2024-03-01 NOTE — Progress Notes (Signed)
 Patient continues with black stool, solid. Hgb 9 after 3 units of prbcs. Blood pressures, vitals stable. Plan for EGD today.  I have discussed the risks, alternatives, benefits with regards to but not limited to the risk of reaction to medication, bleeding, infection, perforation and the patient is agreeable to proceed. Written consent to be obtained.  Sonny RAMAN. Ezzard RIGGERS [facility] [phone] 9/18/202510:01 AM

## 2024-03-01 NOTE — Op Note (Signed)
 [facility] Patient Name: Russell [name] Procedure Date: 03/01/2024 10:59 AM MRN: [id number] Date of Birth: 1955/06/16 Attending MD: Deatrice Dine , MD, [id number] CSN: [id number] Age: 68 Admit Type: Outpatient Procedure:                Upper GI endoscopy Indications:              Acute post hemorrhagic anemia, Melena Providers:                Deatrice Dine, MD, Harlene Lips, [place] Wilson,                            Technician Referring MD:              Medicines:                Monitored Anesthesia Care Complications:            No immediate complications. Estimated Blood Loss:     Estimated blood loss was minimal. Procedure:                Pre-Anesthesia Assessment:                           - Prior to the procedure, a History and Physical                            was performed, and patient medications and                            allergies were reviewed. The patient's tolerance of                            previous anesthesia was also reviewed. The risks                            and benefits of the procedure and the sedation                            options and risks were discussed with the patient.                            All questions were answered, and informed consent                            was obtained. Prior Anticoagulants: The patient has                            taken no anticoagulant or antiplatelet agents                            except for aspirin . ASA Grade Assessment: III - A                            patient with severe systemic disease. After  reviewing the risks and benefits, the patient was                            deemed in satisfactory condition to undergo the                            procedure.                           After obtaining informed consent, the endoscope was                            passed under direct vision. Throughout the                            procedure, the patient's blood  pressure, pulse, and                            oxygen saturations were monitored continuously. The                            [id number] ([id number]) Upper was introduced through                            the mouth, and advanced to the third part of                            duodenum. The upper GI endoscopy was accomplished                            without difficulty. The patient tolerated the                            procedure well. Scope In: 11:38:59 AM Scope Out: 11:52:44 AM Total Procedure Duration: 0 hours 13 minutes 45 seconds  Findings:      A non-obstructing Schatzki ring was found in the lower third of the       esophagus.      The gastroesophageal flap valve was visualized endoscopically and       classified as Hill Grade II (fold present, opens with respiration).      There is no endoscopic evidence of bleeding in the entire examined       stomach. Biopsies were taken with a cold forceps for histology.      Two non-bleeding duodenal ulcers with a clean ulcer base (Forrest Class       III) were found in the first portion of the duodenum. The largest lesion       was 6 mm in largest dimension. This was biopsied with a cold forceps for       histology.      Two small angioectasias without bleeding were found in the second       portion of the duodenum. Coagulation for hemostasis using argon plasma       at 0.3 liters/minute and 20 watts was successful. Impression:               - Non-obstructing Schatzki ring.                           -  Gastroesophageal flap valve classified as Hill                            Grade II (fold present, opens with respiration).                           - Non-bleeding duodenal ulcers with a clean ulcer                            base (Forrest Class III). Biopsied.                           - Two non-bleeding angioectasias in the duodenum.                            Treated with argon plasma coagulation (APC). Moderate Sedation:      Per  Anesthesia Care Recommendation:           - Advance diet as tolerated.                           - Await pathology results.                           -continue PPI daily for 6-8 weeks                           -Avoid NSAIDS except Aspirin  81 mg                           -If further bleeding is encountered or drop in HBG                            may need capsule endoscopy as patient may have more                            small bowel angioectasias not examined Procedure Code(s):        --- Professional ---                           43255, 59, Esophagogastroduodenoscopy, flexible,                            transoral; with control of bleeding, any method                           43239, Esophagogastroduodenoscopy, flexible,                            transoral; with biopsy, single or multiple Diagnosis Code(s):        --- Professional ---                           K22.2, Esophageal obstruction  K26.9, Duodenal ulcer, unspecified as acute or                            chronic, without hemorrhage or perforation                           K31.819, Angiodysplasia of stomach and duodenum                            without bleeding                           D62, Acute posthemorrhagic anemia                           K92.1, Melena (includes Hematochezia) CPT copyright 2022 American Medical Association. All rights reserved. The codes documented in this report are preliminary and upon coder review may  be revised to meet current compliance requirements. Deatrice Dine, MD Deatrice Dine, MD 03/01/2024 12:03:29 PM This report has been signed electronically. Number of Addenda: 0

## 2024-03-02 ENCOUNTER — Telehealth: Payer: Self-pay | Admitting: Gastroenterology

## 2024-03-02 ENCOUNTER — Encounter (HOSPITAL_COMMUNITY): Payer: Self-pay | Admitting: Gastroenterology

## 2024-03-02 DIAGNOSIS — K259 Gastric ulcer, unspecified as acute or chronic, without hemorrhage or perforation: Secondary | ICD-10-CM

## 2024-03-02 DIAGNOSIS — K922 Gastrointestinal hemorrhage, unspecified: Secondary | ICD-10-CM | POA: Diagnosis not present

## 2024-03-02 DIAGNOSIS — D649 Anemia, unspecified: Secondary | ICD-10-CM

## 2024-03-02 LAB — CBC WITH DIFFERENTIAL/PLATELET
Abs Immature Granulocytes: 0.07 K/uL (ref 0.00–0.07)
Basophils Absolute: 0.1 K/uL (ref 0.0–0.1)
Basophils Relative: 1 %
Eosinophils Absolute: 0.4 K/uL (ref 0.0–0.5)
Eosinophils Relative: 3 %
HCT: 25.7 % — ABNORMAL LOW (ref 39.0–52.0)
Hemoglobin: 8.5 g/dL — ABNORMAL LOW (ref 13.0–17.0)
Immature Granulocytes: 1 %
Lymphocytes Relative: 28 %
Lymphs Abs: 2.9 K/uL (ref 0.7–4.0)
MCH: 30 pg (ref 26.0–34.0)
MCHC: 33.1 g/dL (ref 30.0–36.0)
MCV: 90.8 fL (ref 80.0–100.0)
Monocytes Absolute: 0.8 K/uL (ref 0.1–1.0)
Monocytes Relative: 8 %
Neutro Abs: 6.2 K/uL (ref 1.7–7.7)
Neutrophils Relative %: 59 %
Platelets: 253 K/uL (ref 150–400)
RBC: 2.83 MIL/uL — ABNORMAL LOW (ref 4.22–5.81)
RDW: 18.6 % — ABNORMAL HIGH (ref 11.5–15.5)
WBC: 10.4 K/uL (ref 4.0–10.5)
nRBC: 0 % (ref 0.0–0.2)

## 2024-03-02 LAB — BASIC METABOLIC PANEL WITH GFR
Anion gap: 6 (ref 5–15)
BUN: 10 mg/dL (ref 8–23)
CO2: 22 mmol/L (ref 22–32)
Calcium: 8.9 mg/dL (ref 8.9–10.3)
Chloride: 113 mmol/L — ABNORMAL HIGH (ref 98–111)
Creatinine, Ser: 0.67 mg/dL (ref 0.61–1.24)
GFR, Estimated: >60 mL/min (ref >60)
Glucose, Bld: 93 mg/dL (ref 70–99)
Potassium: 3.4 mmol/L — ABNORMAL LOW (ref 3.5–5.1)
Sodium: 141 mmol/L (ref 135–145)

## 2024-03-02 LAB — MAGNESIUM: Magnesium: 1.8 mg/dL (ref 1.7–2.4)

## 2024-03-02 MED ORDER — ORAL CARE MOUTH RINSE: 15.0000 mL | OROMUCOSAL | Status: DC | PRN

## 2024-03-02 MED ORDER — PANTOPRAZOLE SODIUM 40 MG PO TBEC: 40.0000 mg | DELAYED_RELEASE_TABLET | Freq: Two times a day (BID) | ORAL | 1 refills | Status: DC

## 2024-03-02 MED ORDER — POTASSIUM CHLORIDE CRYS ER 20 MEQ PO TBCR
40.0000 meq | EXTENDED_RELEASE_TABLET | Freq: Once | ORAL | Status: AC
  Administered 2024-03-02: 40 meq via ORAL
  Filled 2024-03-02: qty 2

## 2024-03-02 NOTE — Telephone Encounter (Signed)
 Please arrange hospital follow-up with [facility], Dr. Eartha, or myself in 3-4 weeks.  Crystal or Tanya - please arrange CBC for next week through [facility] for patient. Dx: anemia, gastric ulcers.   Charmaine Melia, MSN, APRN, FNP-BC, AGACNP-BC [facility] [place] [facility] at [facility]

## 2024-03-02 NOTE — Plan of Care (Signed)

## 2024-03-02 NOTE — Progress Notes (Signed)
 Gastroenterology Progress Note   Referring Provider: No ref. provider found Primary Care Physician:  [name], MD Primary Gastroenterologist: Toribio Rubins, MD   Patient ID: Russell [name]; [id number]; 10-07-1955    Subjective   Patient denies any abdominal pain comforts of breath, or chest pain.  Feeling well overall.  Antsy and ready to go home.  States he can rest and sleep as needed at home.  Tolerating diet.   Objective   Vital signs in last 24 hours Temp:  [97.7 F (36.5 C)-98.5 F (36.9 C)] 98.1 F (36.7 C) (09/19 0840) Pulse Rate:  [56-96] 56 (09/19 0600) Resp:  [12-21] 17 (09/19 0600) BP: (96-151)/(54-96) 149/66 (09/19 0600) SpO2:  [94 %-100 %] 96 % (09/19 0600) Weight:  [64.9 kg] 64.9 kg (09/19 0404) Last BM Date : 03/01/24  Physical Exam General:   Alert and oriented, pleasant.  Slightly pale. Head:  Normocephalic and atraumatic. Eyes:  No icterus, sclera clear. Conjuctiva pink.  Mouth:  Without lesions, mucosa pink and moist.  Neck:  Supple, without thyromegaly or masses.  Abdomen:  Bowel sounds present, soft, non-tender, non-distended. No HSM or hernias noted. No rebound or guarding. No masses appreciated  Neurologic:  Alert and  oriented x4;  grossly normal neurologically. Skin:  Warm and dry, intact without significant lesions.  Psych:  Alert and cooperative. Normal mood and affect.  Intake/Output from previous day: 09/18 0701 - 09/19 0700 In: 2099.3 [P.O.:480; I.V.:1619.3] Out: 1300 [Urine:1300] Intake/Output this shift: No intake/output data recorded.  Lab Results  Recent Labs    02/29/24 0801 02/29/24 2145 03/01/24 0321 03/02/24 0454  WBC 24.1*  --  17.8* 10.4  HGB 5.4* 9.0* 9.0* 8.5*  HCT 17.0* 26.3* 26.6* 25.7*  PLT 404*  --  279 253   BMET Recent Labs    02/29/24 0801 03/01/24 0321 03/02/24 0454  NA 136 140 141  K 3.7 3.4* 3.4*  CL 106 114* 113*  CO2 22 21* 22  GLUCOSE 135* 90 93  BUN 43* 14 10   CREATININE 0.84 0.77 0.67  CALCIUM  8.8* 8.6* 8.9   LFT Recent Labs    02/29/24 0801 03/01/24 0321  PROT 6.0* 5.1*  ALBUMIN  2.9* 2.5*  AST 17 20  ALT 14 14  ALKPHOS 49 41  BILITOT 0.3 0.4   PT/INR Recent Labs    02/29/24 0801  LABPROT 14.6  INR 1.1   Hepatitis Panel No results for input(s): HEPBSAG, HCVAB, HEPAIGM, HEPBIGM in the last 72 hours.  Studies/Results DG Chest Port 1 View Result Date: 02/29/2024 CLINICAL DATA:  Sepsis.  Nausea and diarrhea. EXAM: PORTABLE CHEST 1 VIEW COMPARISON:  08/28/2014 FINDINGS: Lungs are hyperexpanded. Biapical pleuroparenchymal scarring evident. Streaky density in the left suprahilar region is stable. No focal consolidation, edema, or substantial pleural effusion. The cardiopericardial silhouette is within normal limits for size. No acute bony abnormality. Telemetry leads overlie the chest. IMPRESSION: Hyperexpansion with biapical pleuroparenchymal scarring. No acute cardiopulmonary findings. Electronically Signed   By: Camellia Candle M.D.   On: 02/29/2024 08:12    Assessment  68 y.o. male with a history of anxiety, arthritis, CKD, COPD, HTN, and sleep apnea who presented to the ED via EMS with fatigue, nausea, diarrhea, and hypotension.  Hemoglobin found to be 5.4 on arrival with melanotic stool.  GI consulted for further evaluation.  Anemia, melena, diarrhea, weakness: Weakness/fatigue began on 9/14 with melanotic stools beginning 1-2 days later.  FOBT positive on arrival without reports of BRBPR.  Hemoglobin  13.5 on 8/15 and presented with hemoglobin 5.4.  Transfused 3 units PRBCs.  Denied any NSAID use other than baby aspirin  without any anticoagulants.  C. difficile and GI pathogen panel negative.  WBC 24.1.  Blood cultures without any growth to date.  UA normal.  WBC back to normal today at 10.4.  Hemoglobin initially improved to 9 and stable at 8.5 today.  Colonoscopy up-to-date earlier this year in March as outlined in consult  note.   Plan will be for ongoing monitoring of anemia and follow-up with worsening will need to consider capsule endoscopy to further assess small bowel.  Plan / Recommendations  Diet as tolerated PPI BID on discharge for at least 8-12 weeks CBC next week Hospital follow up in 3-4 weeks.  Consideration for capsule endoscopy outpatient pending clinical course.   Ok to d/c home today from GI standpoint   LOS: 1 day    03/02/2024, 10:04 AM   Charmaine Melia, MSN, FNP-BC, AGACNP-BC [facility]

## 2024-03-02 NOTE — Telephone Encounter (Signed)
Lab orders placed and pt is aware 

## 2024-03-02 NOTE — Discharge Summary (Signed)
 Physician Discharge Summary  Russell [name] FMW:[id number] DOB: 11-20-1955 DOA: 02/29/2024  PCP: Russell Suzzane POUR, MD  Admit date: 02/29/2024 Discharge date: 03/02/2024  Admitted From: Home Disposition: Home  Recommendations for Outpatient Follow-up:  Follow up with PCP in 1 week with repeat CBC/BMP Outpatient follow-up with GI Follow up in ED if symptoms worsen or new appear   Home Health: No Equipment/Devices: None  Discharge Condition: Stable CODE STATUS: Full Diet recommendation: Heart healthy  Brief/Interim Summary: 68 y.o. male with medical history significant of hypertension, COPD, sleep apnea, aortic stenosis status post aortic valve replacement, hyperlipidemia, GAD, chronic pain syndrome on chronic opiates and tobacco abuse presented with fatigue, nausea, diarrhea and some dark stools.  On presentation, he was hypotensive, tachycardic and tachypneic but afebrile. Hemoglobin 5.4 (prior hemoglobin on 01/27/2024 was 13.5).  FOBT positive.  He was started on IV fluids and on IV Protonix .  3 units packed red cells were transfused.  GI was consulted.  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  Subsequently, hemoglobin has remained stable.  GI recommends PPI on discharge and avoid NSAIDs.  GI has cleared the patient for discharge.  Discharge patient home today with outpatient follow-up with PCP and GI.  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  Status post 3 units packed red cells transfusion.  Hemoglobin 8.5 today.  Treated with IV Protonix .   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  Subsequently, hemoglobin has remained stable.  GI recommends PPI twice daily for 6 to 8 weeks on discharge and avoid NSAIDs.  GI has cleared the patient for discharge.  Discharge patient home today with outpatient follow-up with PCP and GI.   Leukocytosis - Possibly reactive from above.  Patient presented with diarrhea.  Stool for C. difficile/GI pathogen negative.  WBCs have normalized today.    Hypokalemia - Mild.  Replace prior to discharge.  Continue replacement at home.  Outpatient follow-up of BMP.   Metabolic acidosis - Resolved   Thrombocytosis - Resolved   Essential hypertension - Resume Coreg  and amlodipine  on discharge.  Lisinopril  and Lasix  on hold till reevaluation by PCP.  Monitor blood pressures and outpatient.   Hyperlipidemia - Outpatient follow-up.  Resume fenofibrate on discharge   Chronic pain with opiate dependence - Continue current pain management.  Outpatient follow-up with PCP/pain management   Discharge Instructions  Discharge Instructions     Ambulatory referral to Gastroenterology   Complete by: As directed    Hospital followup   What is the reason for referral?: Other   Diet - low sodium heart healthy   Complete by: As directed    Increase activity slowly   Complete by: As directed       Allergies as of 03/02/2024       Reactions   Asa [aspirin ] Diarrhea, Nausea And Vomiting   Prozac [fluoxetine Hcl] Other (See Comments)   Confusion         Medication List     STOP taking these medications    aspirin  EC 81 MG tablet   furosemide  20 MG tablet Commonly known as:  LASIX    lisinopril  40 MG tablet Commonly known as: ZESTRIL        TAKE these medications    albuterol  108 (90 Base) MCG/ACT inhaler Commonly known as: VENTOLIN  HFA Inhale 1-2 puffs into the lungs every 6 (six) hours as needed for wheezing or shortness of breath.   ALPRAZolam  1 MG  tablet Commonly known as: XANAX  Take 0.5-1 tablets (0.5-1 mg total) by mouth 3 (three) times daily as needed for anxiety.   amLODipine  5 MG tablet Commonly known as: NORVASC  Take 1 tablet (5 mg total) by mouth daily.   carvedilol  12.5 MG tablet Commonly known as: COREG  Take 1 tablet (12.5 mg total) by mouth 2 (two) times daily.   fenofibrate 54 MG tablet TAKE 1 TABLET BY MOUTH DAILY FOR HIGH TRIGLYCERIDES. What changed: See the new instructions.   ferrous gluconate  324 MG tablet Commonly known as: FERGON Take 1 tablet (324 mg total) by mouth 2 (two) times daily with a meal.   gabapentin  300 MG capsule Commonly known as: NEURONTIN  Take 1 capsule (300 mg total) by mouth 2 (two) times daily.   HYDROcodone -acetaminophen  10-325 MG tablet Commonly known as: NORCO TAKE ONE TABLET BY MOUTH EVERY 6 HOURS AS NEEDED What changed: reasons to take this   MULTI-VITAMIN PO Take 1 tablet by mouth 2 (two) times daily.   pantoprazole  40 MG tablet Commonly known as: Protonix  Take 1 tablet (40 mg total) by mouth 2 (two) times daily before a meal.   potassium chloride  SA 20 MEQ tablet Commonly known as: KLOR-CON  M Take 20 mEq by mouth daily.        Follow-up Information     Russell Suzzane POUR, MD. Schedule an appointment as soon as possible for a visit in 1 week(s).   Specialty: Internal Medicine Why: with repeat cbc/bmp Contact information: [address] [place] [address] [phone]                Allergies  Allergen Reactions   Russell [name] ] Diarrhea and Nausea And Vomiting   Prozac [Fluoxetine Hcl] Other (See Comments)    Confusion     Consultations: GI   Procedures/Studies: DG Chest Port 1 View Result Date: 02/29/2024 CLINICAL DATA:  Sepsis.  Nausea and diarrhea. EXAM: PORTABLE CHEST 1 VIEW COMPARISON:  08/28/2014 FINDINGS: Lungs are hyperexpanded. Biapical pleuroparenchymal scarring evident. Streaky density in the left suprahilar region is stable. No  focal consolidation, edema, or substantial pleural effusion. The cardiopericardial silhouette is within normal limits for size. No acute bony abnormality. Telemetry leads overlie the chest. IMPRESSION: Hyperexpansion with biapical pleuroparenchymal scarring. No acute cardiopulmonary findings. Electronically Signed   By: Camellia Candle M.D.   On: 02/29/2024 08:12    EGD on 03/01/2024 Impression:               - Non-obstructing Schatzki ring.                           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                           - Non-bleeding duodenal ulcers with a clean ulcer  base (Forrest Class III). Biopsied.                           - Two non-bleeding angioectasias in the duodenum.                            Treated with argon plasma coagulation (APC). Moderate Sedation:      Per Anesthesia Care Recommendation:           - Advance diet as tolerated.                           - Await pathology results.                           -continue PPI daily for 6-8 weeks                           -Avoid NSAIDS except Aspirin  81 mg                           -If further bleeding is encountered or drop in HBG                            may need capsule endoscopy as patient may have more                            small bowel angioectasias not examined  Subjective: Patient seen and examined at bedside.  Denies worsening abdominal pain, vomiting or fever.  Wants to go home today.  Discharge Exam: Vitals:   03/02/24 0600 03/02/24 0840  BP: (!) 149/66   Pulse: (!) 56   Resp: 17   Temp:  98.1 F (36.7 C)  SpO2: 96%     General: Pt is alert, awake, not in acute distress Cardiovascular: Mild intermittent bradycardia present, S1/S2 + Respiratory: bilateral decreased breath sounds at bases Abdominal: Soft, NT, ND, bowel sounds + Extremities: no edema, no cyanosis    The results of  significant diagnostics from this hospitalization (including imaging, microbiology, ancillary and laboratory) are listed below for reference.     Microbiology: Recent Results (from the past 240 hours)  C Difficile Quick Screen w PCR reflex     Status: None   Collection Time: 02/29/24  5:23 AM   Specimen: Stool  Result Value Ref Range Status   C Diff antigen NEGATIVE NEGATIVE Final   C Diff toxin NEGATIVE NEGATIVE Final   C Diff interpretation No C. difficile detected.  Final    Comment: Performed at [facility], [address], [place], [place] [address]  Blood Culture (routine x 2)     Status: None (Preliminary result)   Collection Time: 02/29/24  8:01 AM   Specimen: BLOOD  Result Value Ref Range Status   Specimen Description BLOOD BLOOD LEFT ARM  Final   Special Requests   Final    BOTTLES DRAWN AEROBIC AND ANAEROBIC Blood Culture adequate volume   Culture   Final    NO GROWTH 2 DAYS Performed at [facility], [address], [place], [place] [address]    Report Status PENDING  Incomplete  Blood Culture (routine x 2)  Status: None (Preliminary result)   Collection Time: 02/29/24  8:12 AM   Specimen: BLOOD  Result Value Ref Range Status   Specimen Description BLOOD RIGHT ANTECUBITAL  Final   Special Requests   Final    BOTTLES DRAWN AEROBIC AND ANAEROBIC Blood Culture results may not be optimal due to an inadequate volume of blood received in culture bottles   Culture   Final    NO GROWTH 2 DAYS Performed at [facility], [address] [place], [place] [address]    Report Status PENDING  Incomplete  Resp panel by RT-PCR (RSV, Flu A&B, Covid) Anterior Nasal Swab     Status: None   Collection Time: 02/29/24  8:55 AM   Specimen: Anterior Nasal Swab  Result Value Ref Range Status   SARS Coronavirus 2 by RT PCR NEGATIVE NEGATIVE Final    Comment: (NOTE) SARS-CoV-2 target nucleic acids are NOT DETECTED.  The SARS-CoV-2 RNA is generally detectable in upper  respiratory specimens during the acute phase of infection. The lowest concentration of SARS-CoV-2 viral copies this assay can detect is 138 copies/mL. A negative result does not preclude SARS-Cov-2 infection and should not be used as the sole basis for treatment or other patient management decisions. A negative result may occur with  improper specimen collection/handling, submission of specimen other than nasopharyngeal swab, presence of viral mutation(s) within the areas targeted by this assay, and inadequate number of viral copies(<138 copies/mL). A negative result must be combined with clinical observations, patient history, and epidemiological information. The expected result is Negative.  Fact Sheet for Patients:  BloggerCourse.com  Fact Sheet for Healthcare Providers:  SeriousBroker.it  This test is no t yet approved or cleared by the [place]  FDA and  has been authorized for detection and/or diagnosis of SARS-CoV-2 by FDA under an Emergency Use Authorization (EUA). This EUA will remain  in effect (meaning this test can be used) for the duration of the COVID-19 declaration under Section 564(b)(1) of the Act, 21 U.S.C.section 360bbb-3(b)(1), unless the authorization is terminated  or revoked sooner.       Influenza A by PCR NEGATIVE NEGATIVE Final   Influenza B by PCR NEGATIVE NEGATIVE Final    Comment: (NOTE) The Xpert Xpress SARS-CoV-2/FLU/RSV plus assay is intended as an aid in the diagnosis of influenza from Nasopharyngeal swab specimens and should not be used as a sole basis for treatment. Nasal washings and aspirates are unacceptable for Xpert Xpress SARS-CoV-2/FLU/RSV testing.  Fact Sheet for Patients: BloggerCourse.com  Fact Sheet for Healthcare Providers: SeriousBroker.it  This test is not yet approved or cleared by the [place]  FDA and has been  authorized for detection and/or diagnosis of SARS-CoV-2 by FDA under an Emergency Use Authorization (EUA). This EUA will remain in effect (meaning this test can be used) for the duration of the COVID-19 declaration under Section 564(b)(1) of the Act, 21 U.S.C. section 360bbb-3(b)(1), unless the authorization is terminated or revoked.     Resp Syncytial Virus by PCR NEGATIVE NEGATIVE Final    Comment: (NOTE) Fact Sheet for Patients: BloggerCourse.com  Fact Sheet for Healthcare Providers: SeriousBroker.it  This test is not yet approved or cleared by the [place]  FDA and has been authorized for detection and/or diagnosis of SARS-CoV-2 by FDA under an Emergency Use Authorization (EUA). This EUA will remain in effect (meaning this test can be used) for the duration of the COVID-19 declaration under Section 564(b)(1) of the Act, 21 U.S.C. section 360bbb-3(b)(1), unless the authorization is terminated  or revoked.  Performed at [facility], [address] [place], [place] [address]   MRSA Next Gen by PCR, Nasal     Status: None   Collection Time: 02/29/24 10:40 AM   Specimen: Nasal Mucosa; Nasal Swab  Result Value Ref Range Status   MRSA by PCR Next Gen NOT DETECTED NOT DETECTED Final    Comment: (NOTE) The GeneXpert MRSA Assay (FDA approved for NASAL specimens only), is one component of a comprehensive MRSA colonization surveillance program. It is not intended to diagnose MRSA infection nor to guide or monitor treatment for MRSA infections. Test performance is not FDA approved in patients less than 14 years old. Performed at [facility], [address], [place], [place] [address]   Gastrointestinal Panel by PCR , Stool     Status: None   Collection Time: 03/01/24  5:23 AM   Specimen: Stool  Result Value Ref Range Status   Campylobacter species NOT DETECTED NOT DETECTED Final   Plesimonas shigelloides NOT DETECTED NOT DETECTED  Final   Salmonella species NOT DETECTED NOT DETECTED Final   Yersinia enterocolitica NOT DETECTED NOT DETECTED Final   Vibrio species NOT DETECTED NOT DETECTED Final   Vibrio cholerae NOT DETECTED NOT DETECTED Final   Enteroaggregative E coli (EAEC) NOT DETECTED NOT DETECTED Final   Enteropathogenic E coli (EPEC) NOT DETECTED NOT DETECTED Final   Enterotoxigenic E coli (ETEC) NOT DETECTED NOT DETECTED Final   Shiga like toxin producing E coli (STEC) NOT DETECTED NOT DETECTED Final   Shigella/Enteroinvasive E coli (EIEC) NOT DETECTED NOT DETECTED Final   Cryptosporidium NOT DETECTED NOT DETECTED Final   Cyclospora cayetanensis NOT DETECTED NOT DETECTED Final   Entamoeba histolytica NOT DETECTED NOT DETECTED Final   Giardia lamblia NOT DETECTED NOT DETECTED Final   Adenovirus F40/41 NOT DETECTED NOT DETECTED Final   Astrovirus NOT DETECTED NOT DETECTED Final   Norovirus GI/GII NOT DETECTED NOT DETECTED Final   Rotavirus A NOT DETECTED NOT DETECTED Final   Sapovirus (I, II, IV, and V) NOT DETECTED NOT DETECTED Final    Comment: Performed at [facility], [address] [place], [place] [address]     Labs: BNP (last 3 results) No results for input(s): BNP in the last 8760 hours. Basic Metabolic Panel: Recent Labs  Lab 02/29/24 0801 03/01/24 0321 03/02/24 0454  NA 136 140 141  K 3.7 3.4* 3.4*  CL 106 114* 113*  CO2 22 21* 22  GLUCOSE 135* 90 93  BUN 43* 14 10  CREATININE 0.84 0.77 0.67  CALCIUM  8.8* 8.6* 8.9  MG  --  1.7 1.8   Liver Function Tests: Recent Labs  Lab 02/29/24 0801 03/01/24 0321  AST 17 20  ALT 14 14  ALKPHOS 49 41  BILITOT 0.3 0.4  PROT 6.0* 5.1*  ALBUMIN  2.9* 2.5*   No results for input(s): LIPASE, AMYLASE in the last 168 hours. No results for input(s): AMMONIA in the last 168 hours. CBC: Recent Labs  Lab 02/29/24 0801 02/29/24 2145 03/01/24 0321 03/02/24 0454  WBC 24.1*  --  17.8* 10.4  NEUTROABS 19.8*  --   --  6.2  HGB  5.4* 9.0* 9.0* 8.5*  HCT 17.0* 26.3* 26.6* 25.7*  MCV 95.5  --  87.5 90.8  PLT 404*  --  279 253   Cardiac Enzymes: No results for input(s): CKTOTAL, CKMB, CKMBINDEX, TROPONINI in the last 168 hours. BNP: Invalid input(s): POCBNP CBG: No results for input(s): GLUCAP in the last 168  hours. D-Dimer No results for input(s): DDIMER in the last 72 hours. Hgb A1c No results for input(s): HGBA1C in the last 72 hours. Lipid Profile No results for input(s): CHOL, HDL, LDLCALC, TRIG, CHOLHDL, LDLDIRECT in the last 72 hours. Thyroid function studies No results for input(s): TSH, T4TOTAL, T3FREE, THYROIDAB in the last 72 hours.  Invalid input(s): FREET3 Anemia work up No results for input(s): VITAMINB12, FOLATE, FERRITIN, TIBC, IRON , RETICCTPCT in the last 72 hours. Urinalysis    Component Value Date/Time   COLORURINE STRAW (A) 02/29/2024 1055   APPEARANCEUR CLEAR 02/29/2024 1055   LABSPEC 1.009 02/29/2024 1055   PHURINE 7.0 02/29/2024 1055   GLUCOSEU NEGATIVE 02/29/2024 1055   HGBUR NEGATIVE 02/29/2024 1055   BILIRUBINUR NEGATIVE 02/29/2024 1055   KETONESUR NEGATIVE 02/29/2024 1055   PROTEINUR NEGATIVE 02/29/2024 1055   UROBILINOGEN 0.2 06/20/2014 1000   NITRITE NEGATIVE 02/29/2024 1055   LEUKOCYTESUR NEGATIVE 02/29/2024 1055   Sepsis Labs Recent Labs  Lab 02/29/24 0801 03/01/24 0321 03/02/24 0454  WBC 24.1* 17.8* 10.4   Microbiology Recent Results (from the past 240 hours)  C Difficile Quick Screen w PCR reflex     Status: None   Collection Time: 02/29/24  5:23 AM   Specimen: Stool  Result Value Ref Range Status   C Diff antigen NEGATIVE NEGATIVE Final   C Diff toxin NEGATIVE NEGATIVE Final   C Diff interpretation No C. difficile detected.  Final    Comment: Performed at [facility], [address] [place], [place] [address]  Blood Culture (routine x 2)     Status: None (Preliminary result)   Collection Time: 02/29/24   8:01 AM   Specimen: BLOOD  Result Value Ref Range Status   Specimen Description BLOOD BLOOD LEFT ARM  Final   Special Requests   Final    BOTTLES DRAWN AEROBIC AND ANAEROBIC Blood Culture adequate volume   Culture   Final    NO GROWTH 2 DAYS Performed at [facility], [address], [place], [place] [address]    Report Status PENDING  Incomplete  Blood Culture (routine x 2)     Status: None (Preliminary result)   Collection Time: 02/29/24  8:12 AM   Specimen: BLOOD  Result Value Ref Range Status   Specimen Description BLOOD RIGHT ANTECUBITAL  Final   Special Requests   Final    BOTTLES DRAWN AEROBIC AND ANAEROBIC Blood Culture results may not be optimal due to an inadequate volume of blood received in culture bottles   Culture   Final    NO GROWTH 2 DAYS Performed at [facility], [address], [place], [place] [address]    Report Status PENDING  Incomplete  Resp panel by RT-PCR (RSV, Flu A&B, Covid) Anterior Nasal Swab     Status: None   Collection Time: 02/29/24  8:55 AM   Specimen: Anterior Nasal Swab  Result Value Ref Range Status   SARS Coronavirus 2 by RT PCR NEGATIVE NEGATIVE Final    Comment: (NOTE) SARS-CoV-2 target nucleic acids are NOT DETECTED.  The SARS-CoV-2 RNA is generally detectable in upper respiratory specimens during the acute phase of infection. The lowest concentration of SARS-CoV-2 viral copies this assay can detect is 138 copies/mL. A negative result does not preclude SARS-Cov-2 infection and should not be used as the sole basis for treatment or other patient management decisions. A negative result may occur with  improper specimen collection/handling, submission of specimen other than nasopharyngeal swab, presence of viral mutation(s) within  the areas targeted by this assay, and inadequate number of viral copies(<138 copies/mL). A negative result must be combined with clinical observations, patient history, and epidemiological information. The  expected result is Negative.  Fact Sheet for Patients:  BloggerCourse.com  Fact Sheet for Healthcare Providers:  SeriousBroker.it  This test is no t yet approved or cleared by the [place]  FDA and  has been authorized for detection and/or diagnosis of SARS-CoV-2 by FDA under an Emergency Use Authorization (EUA). This EUA will remain  in effect (meaning this test can be used) for the duration of the COVID-19 declaration under Section 564(b)(1) of the Act, 21 U.S.C.section 360bbb-3(b)(1), unless the authorization is terminated  or revoked sooner.       Influenza A by PCR NEGATIVE NEGATIVE Final   Influenza B by PCR NEGATIVE NEGATIVE Final    Comment: (NOTE) The Xpert Xpress SARS-CoV-2/FLU/RSV plus assay is intended as an aid in the diagnosis of influenza from Nasopharyngeal swab specimens and should not be used as a sole basis for treatment. Nasal washings and aspirates are unacceptable for Xpert Xpress SARS-CoV-2/FLU/RSV testing.  Fact Sheet for Patients: BloggerCourse.com  Fact Sheet for Healthcare Providers: SeriousBroker.it  This test is not yet approved or cleared by the [place]  FDA and has been authorized for detection and/or diagnosis of SARS-CoV-2 by FDA under an Emergency Use Authorization (EUA). This EUA will remain in effect (meaning this test can be used) for the duration of the COVID-19 declaration under Section 564(b)(1) of the Act, 21 U.S.C. section 360bbb-3(b)(1), unless the authorization is terminated or revoked.     Resp Syncytial Virus by PCR NEGATIVE NEGATIVE Final    Comment: (NOTE) Fact Sheet for Patients: BloggerCourse.com  Fact Sheet for Healthcare Providers: SeriousBroker.it  This test is not yet approved or cleared by the [place]  FDA and has been authorized for detection and/or  diagnosis of SARS-CoV-2 by FDA under an Emergency Use Authorization (EUA). This EUA will remain in effect (meaning this test can be used) for the duration of the COVID-19 declaration under Section 564(b)(1) of the Act, 21 U.S.C. section 360bbb-3(b)(1), unless the authorization is terminated or revoked.  Performed at [facility], [address] [place], [place] [address]   MRSA Next Gen by PCR, Nasal     Status: None   Collection Time: 02/29/24 10:40 AM   Specimen: Nasal Mucosa; Nasal Swab  Result Value Ref Range Status   MRSA by PCR Next Gen NOT DETECTED NOT DETECTED Final    Comment: (NOTE) The GeneXpert MRSA Assay (FDA approved for NASAL specimens only), is one component of a comprehensive MRSA colonization surveillance program. It is not intended to diagnose MRSA infection nor to guide or monitor treatment for MRSA infections. Test performance is not FDA approved in patients less than 36 years old. Performed at [facility], [address] [place], [place] [address]   Gastrointestinal Panel by PCR , Stool     Status: None   Collection Time: 03/01/24  5:23 AM   Specimen: Stool  Result Value Ref Range Status   Campylobacter species NOT DETECTED NOT DETECTED Final   Plesimonas shigelloides NOT DETECTED NOT DETECTED Final   Salmonella species NOT DETECTED NOT DETECTED Final   Yersinia enterocolitica NOT DETECTED NOT DETECTED Final   Vibrio species NOT DETECTED NOT DETECTED Final   Vibrio cholerae NOT DETECTED NOT DETECTED Final   Enteroaggregative E coli (EAEC) NOT DETECTED NOT DETECTED Final   Enteropathogenic E coli (EPEC) NOT DETECTED NOT DETECTED Final  Enterotoxigenic E coli (ETEC) NOT DETECTED NOT DETECTED Final   Shiga like toxin producing E coli (STEC) NOT DETECTED NOT DETECTED Final   Shigella/Enteroinvasive E coli (EIEC) NOT DETECTED NOT DETECTED Final   Cryptosporidium NOT DETECTED NOT DETECTED Final   Cyclospora cayetanensis NOT DETECTED NOT DETECTED Final    Entamoeba histolytica NOT DETECTED NOT DETECTED Final   Giardia lamblia NOT DETECTED NOT DETECTED Final   Adenovirus F40/41 NOT DETECTED NOT DETECTED Final   Astrovirus NOT DETECTED NOT DETECTED Final   Norovirus GI/GII NOT DETECTED NOT DETECTED Final   Rotavirus A NOT DETECTED NOT DETECTED Final   Sapovirus (I, II, IV, and V) NOT DETECTED NOT DETECTED Final    Comment: Performed at [facility], [address] [place], [place] [address]     Time coordinating discharge: 35 minutes  SIGNED:   Sophie Mao, MD  [facility] 03/02/2024, 9:40 AM

## 2024-03-02 NOTE — Addendum Note (Signed)
 Addended by: Finlee Concepcion on: 03/02/2024 10:58 AM   Modules accepted: Orders

## 2024-03-05 ENCOUNTER — Telehealth: Payer: Self-pay | Admitting: *Deleted

## 2024-03-05 LAB — CULTURE, BLOOD (ROUTINE X 2)
Culture: NO GROWTH
Culture: NO GROWTH
Special Requests: ADEQUATE

## 2024-03-05 LAB — SURGICAL PATHOLOGY

## 2024-03-05 NOTE — Transitions of Care (Post Inpatient/ED Visit) (Signed)
 03/05/2024  Name: Russell [name] MRN: [id number] DOB: [birth year]  Today's TOC FU Call Status: Today's TOC FU Call Status:: Successful TOC FU Call Completed TOC FU Call Complete Date: 03/05/24 Patient's Name and Date of Birth confirmed.  Transition Care Management Follow-up Telephone Call Date of Discharge: 03/02/24 [facility] Type of Discharge: Inpatient Admission Primary Inpatient Discharge Diagnosis:: GI bleed How have you been since you were released from the hospital?: Better Any questions or concerns?: No  Items Reviewed: Did you receive and understand the discharge instructions provided?: Yes Medications obtained,verified, and reconciled?: Yes (Medications Reviewed) Any new allergies since your discharge?: No Dietary orders reviewed?: Yes Type of Diet Ordered:: low sodium heart healthy Do you have support at home?: Yes People in Home [RPT]: spouse Name of Support/Comfort Primary Source: Wife/Linda and Son/Nithin Jr  Medications Reviewed Today: Medications Reviewed Today     Reviewed by Lucky Andrea LABOR, RN (Registered Nurse) on 03/05/24 at 1404  Med List Status: <None>   Medication Order Taking? Sig Documenting Provider Last Dose Status Informant  albuterol  (PROVENTIL  HFA;VENTOLIN  HFA) 108 (90 BASE) MCG/ACT inhaler [id number] Yes Inhale 1-2 puffs into the lungs every 6 (six) hours as needed for wheezing or shortness of breath. [provider]  Active Self, Pharmacy Records  ALPRAZolam  (XANAX ) 1 MG tablet [id number] Yes Take 0.5-1 tablets (0.5-1 mg total) by mouth 3 (three) times daily as needed for anxiety. [name], MD  Active Self, Pharmacy Records  amLODipine  (NORVASC ) 5 MG tablet [id number] Yes Take 1 tablet (5 mg total) by mouth daily. [name], MD  Active Self, Pharmacy Records  carvedilol  (COREG ) 12.5 MG tablet [id number] Yes Take 1 tablet (12.5 mg total) by mouth 2 (two) times daily. [name], MD  Active Self,  Pharmacy Records  fenofibrate 54 MG tablet [id number] Yes TAKE 1 TABLET BY MOUTH DAILY FOR HIGH TRIGLYCERIDES. [name], MD  Active Self, Pharmacy Records  ferrous gluconate  [facility]) 324 MG tablet [id number] Yes Take 1 tablet (324 mg total) by mouth 2 (two) times daily with a meal. Gerome Tillman CROME, PA-C  Active Self, Pharmacy Records  gabapentin  (NEURONTIN ) 300 MG capsule [id number] Yes Take 1 capsule (300 mg total) by mouth 2 (two) times daily. [name], MD  Active Self, Pharmacy Records  HYDROcodone -acetaminophen  [facility]) 10-325 MG tablet [id number] Yes TAKE ONE TABLET BY MOUTH EVERY 6 HOURS AS NEEDED [name], MD  Active Self, Pharmacy Records  Multiple Vitamin (MULTI-VITAMIN PO) [id number] Yes Take 1 tablet by mouth 2 (two) times daily.  [provider]  Active Self, Pharmacy Records  pantoprazole  (PROTONIX ) 40 MG tablet [id number] Yes Take 1 tablet (40 mg total) by mouth 2 (two) times daily before a meal. Cheryle, Kshitiz, MD  Active   potassium chloride  SA (KLOR-CON  M) 20 MEQ tablet [id number] Yes Take 20 mEq by mouth daily. [provider]  Active Self, Pharmacy Records            Home Care and Equipment/Supplies: Were Home Health Services Ordered?: No Any new equipment or medical supplies ordered?: No  Functional Questionnaire: Do you need assistance with bathing/showering or dressing?: No Do you need assistance with meal preparation?: No Do you need assistance with eating?: No Do you have difficulty maintaining continence: No Do you need assistance with getting out of bed/getting out of a chair/moving?: No Do you have difficulty managing or taking your medications?: No  Follow up appointments reviewed: PCP Follow-up appointment confirmed?:  No (Patient will call and scheduled hospital follow up with PCP) MD Provider Line [phone] Given: No Specialist Hospital Follow-up appointment confirmed?: NA (Referral placed to GI, provided patient with  office number [phone]) Do you need transportation to your follow-up appointment?: No Do you understand care options if your condition(s) worsen?: Yes-patient verbalized understanding  SDOH Interventions Today    Flowsheet Row Most Recent Value  SDOH Interventions   Food Insecurity Interventions Intervention Not Indicated  Housing Interventions Intervention Not Indicated  Transportation Interventions Intervention Not Indicated  Utilities Interventions Intervention Not Indicated    Andrea Dimes RN, BSN [place]  [facility] RN Care Manager [phone]

## 2024-03-06 ENCOUNTER — Ambulatory Visit (INDEPENDENT_AMBULATORY_CARE_PROVIDER_SITE_OTHER): Payer: Self-pay | Admitting: Gastroenterology

## 2024-03-09 NOTE — Anesthesia Postprocedure Evaluation (Signed)
 Anesthesia Post Note  Patient: Russell [name]  Procedure(s) Performed: EGD (ESOPHAGOGASTRODUODENOSCOPY)  Patient location during evaluation: Phase II Anesthesia Type: General Level of consciousness: awake Pain management: pain level controlled Vital Signs Assessment: post-procedure vital signs reviewed and stable Respiratory status: spontaneous breathing and respiratory function stable Cardiovascular status: blood pressure returned to baseline and stable Postop Assessment: no headache and no apparent nausea or vomiting Anesthetic complications: no Comments: Late entry   No notable events documented.   Last Vitals:  Vitals:   03/02/24 0900 03/02/24 1000  BP: 109/61 126/69  Pulse: 62 74  Resp: 16 17  Temp:    SpO2: 97% 98%    Last Pain:  Vitals:   03/02/24 0840  TempSrc: Oral  PainSc:                  [name]

## 2024-03-12 NOTE — Telephone Encounter (Signed)
 F/U schd 03/26/24 with Dr Eartha, patient aware

## 2024-03-13 NOTE — Progress Notes (Signed)
 Patient result letter mailed Patient's PCP is on EPIC

## 2024-03-22 DIAGNOSIS — I10 Essential (primary) hypertension: Secondary | ICD-10-CM | POA: Diagnosis not present

## 2024-03-22 DIAGNOSIS — Z720 Tobacco use: Secondary | ICD-10-CM | POA: Diagnosis not present

## 2024-03-22 DIAGNOSIS — E782 Mixed hyperlipidemia: Secondary | ICD-10-CM | POA: Diagnosis not present

## 2024-03-22 DIAGNOSIS — Z952 Presence of prosthetic heart valve: Secondary | ICD-10-CM | POA: Diagnosis not present

## 2024-03-22 DIAGNOSIS — I5020 Unspecified systolic (congestive) heart failure: Secondary | ICD-10-CM | POA: Diagnosis not present

## 2024-03-26 ENCOUNTER — Other Ambulatory Visit: Payer: Self-pay | Admitting: Internal Medicine

## 2024-03-26 ENCOUNTER — Ambulatory Visit (INDEPENDENT_AMBULATORY_CARE_PROVIDER_SITE_OTHER): Admitting: Gastroenterology

## 2024-03-26 DIAGNOSIS — M51362 Other intervertebral disc degeneration, lumbar region with discogenic back pain and lower extremity pain: Secondary | ICD-10-CM

## 2024-03-26 DIAGNOSIS — M4722 Other spondylosis with radiculopathy, cervical region: Secondary | ICD-10-CM

## 2024-03-26 DIAGNOSIS — G894 Chronic pain syndrome: Secondary | ICD-10-CM

## 2024-03-28 ENCOUNTER — Other Ambulatory Visit: Payer: Self-pay | Admitting: Internal Medicine

## 2024-03-28 DIAGNOSIS — I10 Essential (primary) hypertension: Secondary | ICD-10-CM

## 2024-03-28 NOTE — Telephone Encounter (Unsigned)
 Copied from CRM [id number]. Topic: Clinical - Medication Refill >> Mar 28, 2024  3:24 PM Tiffini S wrote: Medication: carvedilol  (COREG ) 12.5 MG tablet  Has the patient contacted their pharmacy? Yes (Agent: If no, request that the patient contact the pharmacy for the refill. If patient does not wish to contact the pharmacy document the reason why and proceed with request.) (Agent: If yes, when and what did the pharmacy advise?)  This is the patient's preferred pharmacy:  [facility] - [place], [place] - [address] [place] [address] Phone: [phone] Fax: [phone]  Is this the correct pharmacy for this prescription? Yes If no, delete pharmacy and type the correct one.   Has the prescription been filled recently? Yes  Is the patient out of the medication? Yes  Has the patient been seen for an appointment in the last year OR does the patient have an upcoming appointment? Yes  Can we respond through MyChart? No  Agent: Please be advised that Rx refills may take up to 3 business days. We ask that you follow-up with your pharmacy.

## 2024-03-29 MED ORDER — CARVEDILOL 12.5 MG PO TABS: 12.5000 mg | ORAL_TABLET | Freq: Two times a day (BID) | ORAL | 5 refills | Status: AC

## 2024-04-10 ENCOUNTER — Encounter: Payer: Self-pay | Admitting: Internal Medicine

## 2024-04-10 ENCOUNTER — Encounter (INDEPENDENT_AMBULATORY_CARE_PROVIDER_SITE_OTHER): Payer: Self-pay | Admitting: *Deleted

## 2024-04-10 ENCOUNTER — Ambulatory Visit: Payer: Self-pay | Admitting: Internal Medicine

## 2024-04-10 VITALS — BP 122/82 | HR 60 | Ht 64.0 in | Wt 136.6 lb

## 2024-04-10 DIAGNOSIS — K31819 Angiodysplasia of stomach and duodenum without bleeding: Secondary | ICD-10-CM

## 2024-04-10 DIAGNOSIS — I10 Essential (primary) hypertension: Secondary | ICD-10-CM | POA: Diagnosis not present

## 2024-04-10 DIAGNOSIS — I5020 Unspecified systolic (congestive) heart failure: Secondary | ICD-10-CM | POA: Diagnosis not present

## 2024-04-10 DIAGNOSIS — D500 Iron deficiency anemia secondary to blood loss (chronic): Secondary | ICD-10-CM | POA: Diagnosis not present

## 2024-04-10 DIAGNOSIS — M4722 Other spondylosis with radiculopathy, cervical region: Secondary | ICD-10-CM | POA: Diagnosis not present

## 2024-04-10 DIAGNOSIS — K922 Gastrointestinal hemorrhage, unspecified: Secondary | ICD-10-CM

## 2024-04-10 DIAGNOSIS — Z952 Presence of prosthetic heart valve: Secondary | ICD-10-CM

## 2024-04-10 DIAGNOSIS — E876 Hypokalemia: Secondary | ICD-10-CM | POA: Diagnosis not present

## 2024-04-10 MED ORDER — PANTOPRAZOLE SODIUM 40 MG PO TBEC: 40.0000 mg | DELAYED_RELEASE_TABLET | Freq: Two times a day (BID) | ORAL | 5 refills | Status: DC

## 2024-04-10 NOTE — Assessment & Plan Note (Signed)
 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 -LVEF has recovered on GDMT post AVR/MVR  Euvolemic on examination today -takes Lasix  20 mg QD PRN Follow-up with Cardiology at [facility], but referred to [facility] Cardiology clinic due to insurance preference

## 2024-04-10 NOTE — Patient Instructions (Signed)
 Please continue taking Pantoprazole  regularly.  Please follow up with GI as scheduled.  Please continue taking only half tablet of Lisinopril  once daily for now.

## 2024-04-10 NOTE — Assessment & Plan Note (Signed)
 S/p 3 U PRBC transfusion during recent hospitalization Check CBC and iron  profile Continue pantoprazole  for history of GI bleed

## 2024-04-10 NOTE — Progress Notes (Signed)
 Established Patient Office Visit  Subjective:  Patient ID: Russell [name], male    DOB: [birth year]  Age: 68 y.o. MRN: [id number]  CC:  Chief Complaint  Patient presents with   Hospitalization Follow-up    Following up from hospital visit.     HPI Russell [name] is a 68 y.o. male with past medical history of HTN, aortic stenosis s/p aortic valve replacement, HLD, GAD, chronic pain syndrome and tobacco abuse who presents for follow-up after recent hospitalization from 02/29/24-03/02/24.  He presented with fatigue, nausea, diarrhea and some dark stools.  On presentation, he was hypotensive, tachycardic and tachypneic but afebrile. Hemoglobin 5.4 (prior hemoglobin on 01/27/2024 was 13.5).  FOBT positive.  He was started on IV fluids and on IV Protonix .  3 units packed red cells were transfused.  GI was consulted.  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  Subsequently, hemoglobin has remained stable.  GI recommended PPI on discharge and avoid NSAIDs.  He denies any episode of melena or hematochezia since being discharged from the hospital.  His BP is WNL today.  He has been taking lisinopril  20 mg QD instead of 40 mg now.  Denies any episode of headache, chest pain, or palpitations.  He has a history of HFpEF, followed by [facility] cardiology.  He requests a new referral to [facility] cardiology clinic due to insurance coverage concern.    Past Medical History:  Diagnosis Date   Anxiety    Arthritis    Back pain    Cervical disc disease    C6-7   Chronic kidney disease    kidney cyst   COPD (chronic obstructive pulmonary disease) (HCC)    Essential hypertension    Lung nodules    Shortness of breath dyspnea    Sleep apnea    per wife, no sleep study    Past Surgical History:  Procedure Laterality Date   AORTIC VALVE REPLACEMENT N/A  06/17/2014   Procedure: AORTIC VALVE REPLACEMENT (AVR);  Surgeon: Dorise MARLA Fellers, MD;  Location: [facility] OR;  Service: Open Heart Surgery;  Laterality: N/A;   BACK SURGERY     CARDIAC CATHETERIZATION  05/23/14   COLONOSCOPY  10/19/2011   Procedure: COLONOSCOPY;  Surgeon: Oneil DELENA Budge, MD;  Location[facility] ENDO SUITE;  Service: Gastroenterology;  Laterality: N/A;   COLONOSCOPY WITH PROPOFOL  N/A 08/16/2023   Procedure: COLONOSCOPY WITH PROPOFOL ;  Surgeon: Eartha Angelia Sieving, MD;  Location: [facility] ENDO SUITE;  Service: Gastroenterology;  Laterality: N/A;  1:00PM;ASA 3   ESOPHAGOGASTRODUODENOSCOPY N/A 03/01/2024   Procedure: EGD (ESOPHAGOGASTRODUODENOSCOPY);  Surgeon: Cinderella [name], MD;  Location: [facility] END[facility]ITE;  Service: Endoscopy;  Laterality: N/A;   LEFT AND RIGHT HEART CATHETERIZATION WITH CORONARY ANGIOGRAM N/A 05/23/2014   Procedure: LEFT AND RIGHT HEART CATHETERIZATION WITH CORONARY ANGIOGRAM;  Surgeon: Candyce GORMAN Reek, MD;  Location: [facility] CATH LAB;  Service: Cardiovascular;  Laterality: N/A;   MITRAL VALVE REPAIR N/A 06/17/2014   Procedure: MITRAL VALVE REPAIR (MVR);  Surgeon: Dorise MARLA Fellers, MD;  Location: [facility] OR;  Service: Open Heart Surgery;  Laterality: N/A;   POLYPECTOMY  08/16/2023   Procedure: POLYPECTOMY, INTESTINE;  Surgeon: Eartha Angelia, Sieving, MD;  [facility];  Service: Gastroenterology;;   Right knee surgery     Cartilage removed   TEE WITHOUT CARDIOVERSION N/A 06/17/2014   Procedure: TRANSESOPHAGEAL ECHOCARDIOGRAM (TEE);  Surgeon:  Dorise MARLA Fellers, MD;  Location: [facility] OR;  Service: Open Heart Surgery;  Laterality: N/A;    Family History  Problem Relation Age of Onset   Atrial fibrillation Mother    Heart disease Father    Heart disease Maternal Grandfather     Social History   Socioeconomic History   Marital status: Married    Spouse name: Not on file   Number of children: Not on file   Years of education: Not on file   Highest education level: Not on file   Occupational History   Not on file  Tobacco Use   Smoking status: Every Day    Current packs/day: 0.25    Average packs/day: 0.3 packs/day for 55.8 years (14.0 ttl pk-yrs)    Types: Cigarettes    Start date: 06/22/1968   Smokeless tobacco: Never   Tobacco comments:    patient restarted smoking  Vaping Use   Vaping status: Never Used  Substance and Sexual Activity   Alcohol use: No    Alcohol/week: 0.0 standard drinks of alcohol   Drug use: No   Sexual activity: Yes  Other Topics Concern   Not on file  Social History Narrative   Not on file   Social Drivers of Health   Financial Resource Strain: Not on file  Food Insecurity: No Food Insecurity (03/05/2024)   Hunger Vital Sign    Worried About Running Out of Food in the Last Year: Never true    Ran Out of Food in the Last Year: Never true  Transportation Needs: No Transportation Needs (03/05/2024)   PRAPARE - Administrator, Civil Service (Medical): No    Lack of Transportation (Non-Medical): No  Physical Activity: Not on file  Stress: Not on file  Social Connections: Moderately Isolated (02/29/2024)   Social Connection and Isolation Panel    Frequency of Communication with Friends and Family: More than three times a week    Frequency of Social Gatherings with Friends and Family: More than three times a week    Attends Religious Services: Never    Database Administrator or Organizations: No    Attends Banker Meetings: Never    Marital Status: Married  Catering Manager Violence: Not At Risk (03/05/2024)   Humiliation, Afraid, Rape, and Kick questionnaire    Fear of Current or Ex-Partner: No    Emotionally Abused: No    Physically Abused: No    Sexually Abused: No    Outpatient Medications Prior to Visit  Medication Sig Dispense Refill   albuterol  (PROVENTIL  HFA;VENTOLIN  HFA) 108 (90 BASE) MCG/ACT inhaler Inhale 1-2 puffs into the lungs every 6 (six) hours as needed for wheezing or shortness of  breath.     ALPRAZolam  (XANAX ) 1 MG tablet Take 0.5-1 tablets (0.5-1 mg total) by mouth 3 (three) times daily as needed for anxiety. 90 tablet 3   amLODipine  (NORVASC ) 5 MG tablet Take 1 tablet (5 mg total) by mouth daily. 90 tablet 1   carvedilol  (COREG ) 12.5 MG tablet Take 1 tablet (12.5 mg total) by mouth 2 (two) times daily. 60 tablet 5   fenofibrate 54 MG tablet TAKE 1 TABLET BY MOUTH DAILY FOR HIGH TRIGLYCERIDES. 90 tablet 3   ferrous gluconate  (FERGON) 324 MG tablet Take 1 tablet (324 mg total) by mouth 2 (two) times daily with a meal. 60 tablet 1   furosemide  (LASIX ) 20 MG tablet Take 20 mg by mouth as needed for fluid or edema.  gabapentin  (NEURONTIN ) 300 MG capsule Take 1 capsule (300 mg total) by mouth 2 (two) times daily. 180 capsule 1   HYDROcodone -acetaminophen  (NORCO) 10-325 MG tablet Take 1 tablet by mouth every 6 (six) hours as needed. 120 tablet 0   lisinopril  (ZESTRIL ) 40 MG tablet Take 40 mg by mouth daily. (Patient taking differently: Take 20 mg by mouth daily. Half a pill)     Multiple Vitamin (MULTI-VITAMIN PO) Take 1 tablet by mouth 2 (two) times daily.      potassium chloride  SA (KLOR-CON  M) 20 MEQ tablet Take 20 mEq by mouth daily.     pantoprazole  (PROTONIX ) 40 MG tablet Take 1 tablet (40 mg total) by mouth 2 (two) times daily before a meal. 60 tablet 1   No facility-administered medications prior to visit.    Allergies  Allergen Reactions   Asa [Aspirin ] Diarrhea and Nausea And Vomiting   Prozac [Fluoxetine Hcl] Other (See Comments)    Confusion     ROS Review of Systems  Constitutional:  Negative for chills and fever.  HENT:  Negative for congestion and sore throat.   Eyes:  Negative for pain and discharge.  Respiratory:  Negative for cough and shortness of breath.   Cardiovascular:  Negative for chest pain and palpitations.  Gastrointestinal:  Negative for diarrhea, nausea and vomiting.  Endocrine: Negative for polydipsia and polyuria.  Genitourinary:   Negative for dysuria and hematuria.  Musculoskeletal:  Positive for back pain, gait problem and neck pain. Negative for neck stiffness.  Skin:  Negative for rash.  Neurological:  Positive for weakness (B/l LE) and numbness. Negative for dizziness and headaches.  Psychiatric/Behavioral:  Positive for sleep disturbance. Negative for agitation and behavioral problems. The patient is nervous/anxious.       Objective:    Physical Exam Vitals reviewed.  Constitutional:      General: He is not in acute distress.    Appearance: He is not diaphoretic.  HENT:     Head: Normocephalic and atraumatic.     Nose: Nose normal.     Mouth/Throat:     Mouth: Mucous membranes are moist.  Eyes:     General: No scleral icterus.    Extraocular Movements: Extraocular movements intact.  Cardiovascular:     Rate and Rhythm: Normal rate and regular rhythm.     Heart sounds: Normal heart sounds. No murmur heard. Pulmonary:     Breath sounds: Normal breath sounds. No wheezing or rales.  Abdominal:     Palpations: Abdomen is soft.     Tenderness: There is no abdominal tenderness.  Musculoskeletal:     Cervical back: Neck supple. Tenderness present. Pain with movement present. Decreased range of motion.     Lumbar back: Tenderness present. Decreased range of motion.     Right lower leg: No edema.     Left lower leg: No edema.     Comments: Lumbar back brace in place  Skin:    General: Skin is warm.     Findings: No rash.  Neurological:     General: No focal deficit present.     Mental Status: He is alert and oriented to person, place, and time.     Sensory: Sensory deficit (B/l LE) present.     Motor: Weakness (B/l LE - 4/5) present.  Psychiatric:        Mood and Affect: Mood normal.        Behavior: Behavior normal.     BP 122/82 (BP Location: Left  Arm, Patient Position: Sitting)   Pulse 60   Ht 5' 4 (1.626 m)   Wt 136 lb 9.6 oz (62 kg)   SpO2 93%   BMI 23.45 kg/m  Wt Readings from Last  3 Encounters:  04/10/24 136 lb 9.6 oz (62 kg)  03/02/24 143 lb 1.3 oz (64.9 kg)  02/06/24 139 lb 6.4 oz (63.2 kg)    Lab Results  Component Value Date   TSH 0.638 05/16/2014   Lab Results  Component Value Date   WBC 10.4 03/02/2024   HGB 8.5 (L) 03/02/2024   HCT 25.7 (L) 03/02/2024   MCV 90.8 03/02/2024   PLT 253 03/02/2024   Lab Results  Component Value Date   NA 141 03/02/2024   K 3.4 (L) 03/02/2024   CO2 22 03/02/2024   GLUCOSE 93 03/02/2024   BUN 10 03/02/2024   CREATININE 0.67 03/02/2024   BILITOT 0.4 03/01/2024   ALKPHOS 41 03/01/2024   AST 20 03/01/2024   ALT 14 03/01/2024   PROT 5.1 (L) 03/01/2024   ALBUMIN  2.5 (L) 03/01/2024   CALCIUM  8.9 03/02/2024   ANIONGAP 6 03/02/2024   EGFR 85 01/27/2024   Lab Results  Component Value Date   CHOL 152 01/27/2024   Lab Results  Component Value Date   HDL 53 01/27/2024   Lab Results  Component Value Date   LDLCALC 79 01/27/2024   Lab Results  Component Value Date   TRIG 110 01/27/2024   Lab Results  Component Value Date   CHOLHDL 2.9 01/27/2024   Lab Results  Component Value Date   HGBA1C 5.5 01/27/2024      Assessment & Plan:   Problem List Items Addressed This Visit       Cardiovascular and Mediastinum   Essential hypertension   BP Readings from Last 1 Encounters:  04/10/24 122/82   Well-controlled with amlodipine  5 mg QD, carvedilol  12.5 mg BID, lisinopril  20 mg QD and Lasix  20 mg QD Counseled for compliance with the medications Advised DASH diet and moderate exercise/walking, at least 150 mins/week      Relevant Medications   lisinopril  (ZESTRIL ) 40 MG tablet   furosemide  (LASIX ) 20 MG tablet   Heart failure with improved ejection fraction (HFimpEF) (HCC)   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 -LVEF has recovered on GDMT post AVR/MVR  Euvolemic on examination today -takes Lasix  20 mg QD PRN Follow-up with Cardiology at [facility], but referred to [facility] Cardiology clinic due to  insurance preference      Relevant Medications   lisinopril  (ZESTRIL ) 40 MG tablet   furosemide  (LASIX ) 20 MG tablet   Other Relevant Orders   Ambulatory referral to Cardiology   Angiectasia of gastrointestinal tract   Recently had EGD for melena Needs GI follow-up - referred to GI      Relevant Medications   lisinopril  (ZESTRIL ) 40 MG tablet   furosemide  (LASIX ) 20 MG tablet   Other Relevant Orders   Ambulatory referral to Gastroenterology     Digestive   Upper GI bleed - Primary   Had recent hospitalization for upper GI bleed Had EGD, which showed nonbleeding duodenal ulcers with a clean base and 2 nonbleeding angiectasia's in the duodenum, treated with APC Referred to GI for follow-up Continue pantoprazole  40 mg BID Advised to avoid NSAIDs      Relevant Medications   pantoprazole  (PROTONIX ) 40 MG tablet   Other Relevant Orders   CBC with Differential/Platelet   Fe+TIBC+Fer   Ambulatory  referral to Gastroenterology     Nervous and Auditory   Cervical spondylosis with radiculopathy   Has chronic neck pain Currently takes Norco 10-325 mg q6h PRN - referred to pain clinic, dose was decreased, he had severe worsening of pain and fatigue On Norco 10-325 mg q6h PRN, have agreed to manage his chronic pain for now - PDMP reviewed On gabapentin  300 mg BID for persistent neck pain, has radicular pain towards UE and occipital area        Other   History of aortic valve replacement   Due to aortic stenosis Has seen [facility] cardiology clinic in [place], needs routine follow-up - sent referral to [facility] health cardiology clinic due to insurance preference      Relevant Orders   Ambulatory referral to Cardiology   Hypokalemia   Relevant Orders   Basic Metabolic Panel (BMET)   Chronic blood loss anemia   S/p 3 U PRBC transfusion during recent hospitalization Check CBC and iron  profile Continue pantoprazole  for history of GI bleed      Relevant Orders   CBC with  Differential/Platelet   Fe+TIBC+Fer    Meds ordered this encounter  Medications   pantoprazole  (PROTONIX ) 40 MG tablet    Sig: Take 1 tablet (40 mg total) by mouth 2 (two) times daily before a meal.    Dispense:  60 tablet    Refill:  5    Follow-up: Return if symptoms worsen or fail to improve.    Suzzane MARLA Blanch, MD

## 2024-04-10 NOTE — Assessment & Plan Note (Signed)
 Had recent hospitalization for upper GI bleed Had EGD, which showed nonbleeding duodenal ulcers with a clean base and 2 nonbleeding angiectasia's in the duodenum, treated with APC Referred to GI for follow-up Continue pantoprazole  40 mg BID Advised to avoid NSAIDs

## 2024-04-10 NOTE — Assessment & Plan Note (Signed)
 Recently had EGD for melena Needs GI follow-up - referred to GI

## 2024-04-10 NOTE — Assessment & Plan Note (Signed)
 Has chronic neck pain Currently takes Norco 10-325 mg q6h PRN - referred to pain clinic, dose was decreased, he had severe worsening of pain and fatigue On Norco 10-325 mg q6h PRN, have agreed to manage his chronic pain for now On gabapentin  300 mg nightly for persistent neck pain, has radicular pain towards UE and occipital area - increased dose to 300 mg BID

## 2024-04-10 NOTE — Assessment & Plan Note (Addendum)
 BP Readings from Last 1 Encounters:  04/10/24 122/82   Well-controlled with amlodipine  5 mg QD, carvedilol  12.5 mg BID, lisinopril  20 mg QD and Lasix  20 mg QD Counseled for compliance with the medications Advised DASH diet and moderate exercise/walking, at least 150 mins/week

## 2024-04-10 NOTE — Assessment & Plan Note (Signed)
 Due to aortic stenosis Has seen [facility] cardiology clinic in [place], needs routine follow-up - sent referral to [facility] health cardiology clinic due to insurance preference

## 2024-04-11 ENCOUNTER — Ambulatory Visit: Payer: Self-pay | Admitting: Internal Medicine

## 2024-04-11 LAB — CBC WITH DIFFERENTIAL/PLATELET
Basophils Absolute: 0.1 x10E3/uL (ref 0.0–0.2)
Basos: 1 %
EOS (ABSOLUTE): 0.3 x10E3/uL (ref 0.0–0.4)
Eos: 2 %
Hematocrit: 43.3 % (ref 37.5–51.0)
Hemoglobin: 13.7 g/dL (ref 13.0–17.7)
Immature Grans (Abs): 0 x10E3/uL (ref 0.0–0.1)
Immature Granulocytes: 0 %
Lymphocytes Absolute: 2.4 x10E3/uL (ref 0.7–3.1)
Lymphs: 17 %
MCH: 28.7 pg (ref 26.6–33.0)
MCHC: 31.6 g/dL (ref 31.5–35.7)
MCV: 91 fL (ref 79–97)
Monocytes Absolute: 1 x10E3/uL — ABNORMAL HIGH (ref 0.1–0.9)
Monocytes: 7 %
Neutrophils Absolute: 10.2 x10E3/uL — ABNORMAL HIGH (ref 1.4–7.0)
Neutrophils: 73 %
Platelets: 312 x10E3/uL (ref 150–450)
RBC: 4.78 x10E6/uL (ref 4.14–5.80)
RDW: 14.3 % (ref 11.6–15.4)
WBC: 13.9 x10E3/uL — ABNORMAL HIGH (ref 3.4–10.8)

## 2024-04-11 LAB — BASIC METABOLIC PANEL WITH GFR
BUN/Creatinine Ratio: 11 (ref 10–24)
BUN: 12 mg/dL (ref 8–27)
CO2: 22 mmol/L (ref 20–29)
Calcium: 11.4 mg/dL — ABNORMAL HIGH (ref 8.6–10.2)
Chloride: 101 mmol/L (ref 96–106)
Creatinine, Ser: 1.06 mg/dL (ref 0.76–1.27)
Glucose: 96 mg/dL (ref 70–99)
Potassium: 4.4 mmol/L (ref 3.5–5.2)
Sodium: 142 mmol/L (ref 134–144)
eGFR: 75 mL/min/1.73

## 2024-04-11 LAB — IRON,TIBC AND FERRITIN PANEL
Ferritin: 269 ng/mL (ref 30–400)
Iron Saturation: 18 % (ref 15–55)
Iron: 69 ug/dL (ref 38–169)
Total Iron Binding Capacity: 390 ug/dL (ref 250–450)
UIBC: 321 ug/dL (ref 111–343)

## 2024-04-18 ENCOUNTER — Encounter (INDEPENDENT_AMBULATORY_CARE_PROVIDER_SITE_OTHER): Payer: Self-pay | Admitting: Gastroenterology

## 2024-04-26 ENCOUNTER — Other Ambulatory Visit: Payer: Self-pay | Admitting: Internal Medicine

## 2024-04-26 DIAGNOSIS — M4722 Other spondylosis with radiculopathy, cervical region: Secondary | ICD-10-CM

## 2024-04-26 DIAGNOSIS — F411 Generalized anxiety disorder: Secondary | ICD-10-CM

## 2024-04-26 DIAGNOSIS — M51362 Other intervertebral disc degeneration, lumbar region with discogenic back pain and lower extremity pain: Secondary | ICD-10-CM

## 2024-04-26 DIAGNOSIS — G894 Chronic pain syndrome: Secondary | ICD-10-CM

## 2024-04-26 NOTE — Telephone Encounter (Signed)
 Copied from CRM [id number]. Topic: Clinical - Prescription Issue >> Apr 26, 2024 10:04 AM Terri MATSU wrote: Reason for CRM: Patient needs a new prescription for HYDROcodone -acetaminophen  (NORCO) 10-325 MG tablet

## 2024-05-02 ENCOUNTER — Encounter (INDEPENDENT_AMBULATORY_CARE_PROVIDER_SITE_OTHER): Payer: Self-pay | Admitting: Gastroenterology

## 2024-05-02 ENCOUNTER — Ambulatory Visit (INDEPENDENT_AMBULATORY_CARE_PROVIDER_SITE_OTHER): Admitting: Gastroenterology

## 2024-05-02 VITALS — BP 119/73 | HR 59 | Temp 98.1°F | Ht 64.0 in | Wt 137.1 lb

## 2024-05-02 DIAGNOSIS — K259 Gastric ulcer, unspecified as acute or chronic, without hemorrhage or perforation: Secondary | ICD-10-CM | POA: Diagnosis not present

## 2024-05-02 DIAGNOSIS — K31819 Angiodysplasia of stomach and duodenum without bleeding: Secondary | ICD-10-CM

## 2024-05-02 DIAGNOSIS — K5531 Stage 1 necrotizing enterocolitis: Secondary | ICD-10-CM | POA: Diagnosis not present

## 2024-05-02 DIAGNOSIS — D649 Anemia, unspecified: Secondary | ICD-10-CM | POA: Diagnosis not present

## 2024-05-02 DIAGNOSIS — K922 Gastrointestinal hemorrhage, unspecified: Secondary | ICD-10-CM

## 2024-05-02 MED ORDER — PANTOPRAZOLE SODIUM 40 MG PO TBEC: 40.0000 mg | DELAYED_RELEASE_TABLET | Freq: Every day | ORAL | 1 refills | Status: AC

## 2024-05-02 NOTE — Progress Notes (Signed)
 Russell [name] , M.D. Gastroenterology & Hepatology [facility] Gastroenterology [address] [place], [place] [address] Primary Care Physician: Russell Suzzane POUR, MD [address] [place] [address]  Chief Complaint: Follow-up for anemia  History of Present Illness:  This is a 68 y.o. male with a history of anxiety, arthritis, CKD, COPD, HTN, and sleep apnea is here for follow-up after being hospitalized due to posthemorrhagic shock secondary to peptic ulcer disease and small bowel AVMs.  Patient was admitted in the hospital September 2025 with hemoglobin 5.4 and melena underwent upper endoscopy with ablation of small bowel AVMs . No further overt bleeding and fortunately hemoglobin is back to normal at 13.7.  Patient reports she is back to baseline having brown-colored stools formed daily.  No active complaints The patient denies having any nausea, vomiting, fever, chills,  hematemesis, abdominal distention, abdominal pain, diarrhea, jaundice, pruritus or weight loss.  Last EGD:02/2024  - Non- obstructing Schatzki ring. 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 - Non- bleeding duodenal ulcers with a clean ulcer base ( Forrest Class III) . Biopsied. 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 Treated with argon plasma coagulation ( APC) .    A. DUODENAL ULCER BIOPSY:  - Peptic duodenitis   B. STOMACH BIOPSY:  - Mild chronic gastritis.  - Negative for H. pylori on HE stain  - No intestinal metaplasia, dysplasia, or malignancy.   Last Colonoscopy: 08/2023 - Four 3 to 5 mm polyps in the ascending colon and                            in the cecum, removed with a cold snare. Resected                            and retrieved.                           - One 3 mm polyp in the descending colon, removed                            with a cold snare. Resected and retrieved.                            - Diverticulosis in the sigmoid colon.                           - The distal rectum and anal verge are normal on                            retroflexion view.   COLON, CECUM, ASCENDING, POLYPECTOMY:       Tubular adenoma.       Sessile serrated polyp without cytologic dysplasia.       Negative for high-grade dysplasia.   B. COLON, DESCENDING, POLYPECTOMY:       Tubular adenoma.       Negative for high-grade dysplasia   Repeat 3 years  Baseline hemoglobin 13.5--->5---13.5 (04/10/2024)   Past Medical History: Past Medical History:  Diagnosis Date   Anxiety    Arthritis    Back pain  Cervical disc disease    C6-7   Chronic kidney disease    kidney cyst   COPD (chronic obstructive pulmonary disease) (HCC)    Essential hypertension    Lung nodules    Shortness of breath dyspnea    Sleep apnea    per wife, no sleep study    Past Surgical History: Past Surgical History:  Procedure Laterality Date   AORTIC VALVE REPLACEMENT N/A 06/17/2014   Procedure: AORTIC VALVE REPLACEMENT (AVR);  Surgeon: Dorise MARLA Fellers, MD;  Location: [facility] OR;  Service: Open Heart Surgery;  Laterality: N/A;   BACK SURGERY     CARDIAC CATHETERIZATION  05/23/14   COLONOSCOPY  10/19/2011   Procedure: COLONOSCOPY;  Surgeon: Oneil DELENA Budge, MD;  Location: [facility] SUITE;  Service: Gastroenterology;  Laterality: N/A;   COLONOSCOPY WITH PROPOFOL  N/A 08/16/2023   Procedure: COLONOSCOPY WITH PROPOFOL ;  Surgeon: Eartha Angelia Sieving, MD;  Location: [facility];  Service: Gastroenterology;  Laterality: N/A;  1:00PM;ASA 3   ESOPHAGOGASTRODUODENOSCOPY N/A 03/01/2024   Procedure: EGD (ESOPHAGOGASTRODUODENOSCOPY);  Surgeon: Cinderella [name], MD;  Location: [facility];  Service: Endoscopy;  Laterality: N/A;   LEFT AND RIGHT HEART CATHETERIZATION WITH CORONARY ANGIOGRAM N/A 05/23/2014   Procedure: LEFT AND RIGHT HEART CATHETERIZATION WITH CORONARY ANGIOGRAM;  Surgeon: Candyce GORMAN Reek, MD;  Location: [facility] CATH  LAB;  Service: Cardiovascular;  Laterality: N/A;   MITRAL VALVE REPAIR N/A 06/17/2014   Procedure: MITRAL VALVE REPAIR (MVR);  Surgeon: Dorise MARLA Fellers, MD;  Location: [facility] OR;  Service: Open Heart Surgery;  Laterality: N/A;   POLYPECTOMY  08/16/2023   Procedure: POLYPECTOMY, INTESTINE;  Surgeon: Eartha Angelia, Sieving, MD;  Location: [facility];  Service: Gastroenterology;;   Right knee surgery     Cartilage removed   TEE WITHOUT CARDIOVERSION N/A 06/17/2014   Procedure: TRANSESOPHAGEAL ECHOCARDIOGRAM (TEE);  Surgeon: Dorise MARLA Fellers, MD;  Location: [facility] OR;  Service: Open Heart Surgery;  Laterality: N/A;    Family History: Family History  Problem Relation Age of Onset   Atrial fibrillation Mother    Heart disease Father    Heart disease Maternal Grandfather     Social History: Social History   Tobacco Use  Smoking Status Every Day   Current packs/day: 0.25   Average packs/day: 0.3 packs/day for 55.9 years (14.0 ttl pk-yrs)   Types: Cigarettes   Start date: 06/22/1968  Smokeless Tobacco Never  Tobacco Comments   patient restarted smoking   Social History   Substance and Sexual Activity  Alcohol Use No   Alcohol/week: 0.0 standard drinks of alcohol   Social History   Substance and Sexual Activity  Drug Use No    Allergies: Allergies  Allergen Reactions   Russell [name] ] Diarrhea and Nausea And Vomiting   Prozac [Fluoxetine Hcl] Other (See Comments)    Confusion     Medications: Current Outpatient Medications  Medication Sig Dispense Refill   albuterol  (PROVENTIL  HFA;VENTOLIN  HFA) 108 (90 BASE) MCG/ACT inhaler Inhale 1-2 puffs into the lungs every 6 (six) hours as needed for wheezing or shortness of breath.     ALPRAZolam  (XANAX ) 1 MG tablet Take 0.5-1 tablets (0.5-1 mg total) by mouth 3 (three) times daily as needed for anxiety. 90 tablet 1   amLODipine  (NORVASC ) 5 MG tablet Take 1 tablet (5 mg total) by mouth daily. 90 tablet 1   carvedilol  (COREG ) 12.5 MG tablet Take  1 tablet (12.5 mg total) by mouth 2 (two) times daily. 60 tablet 5  fenofibrate 54 MG tablet TAKE 1 TABLET BY MOUTH DAILY FOR HIGH TRIGLYCERIDES. 90 tablet 3   ferrous gluconate  (FERGON) 324 MG tablet Take 1 tablet (324 mg total) by mouth 2 (two) times daily with a meal. 60 tablet 1   furosemide  (LASIX ) 20 MG tablet Take 20 mg by mouth as needed for fluid or edema.     gabapentin  (NEURONTIN ) 300 MG capsule TAKE ONE CAPSULE BY MOUTH TWICE DAILY 180 capsule 1   HYDROcodone -acetaminophen  (NORCO) 10-325 MG tablet TAKE ONE TABLET BY MOUTH EVERY SIX HOURS AS NEEDED 120 tablet 0   lisinopril  (ZESTRIL ) 40 MG tablet Take 40 mg by mouth daily. (Patient taking differently: Take 20 mg by mouth daily. Half a pill)     pantoprazole  (PROTONIX ) 40 MG tablet Take 1 tablet (40 mg total) by mouth 2 (two) times daily before a meal. 60 tablet 5   potassium chloride  SA (KLOR-CON  M) 20 MEQ tablet Take 20 mEq by mouth daily.     Multiple Vitamin (MULTI-VITAMIN PO) Take 1 tablet by mouth 2 (two) times daily.      No current facility-administered medications for this visit.    Review of Systems: GENERAL: negative for malaise, night sweats HEENT: No changes in hearing or vision, no nose bleeds or other nasal problems. NECK: Negative for lumps, goiter, pain and significant neck swelling RESPIRATORY: Negative for cough, wheezing CARDIOVASCULAR: Negative for chest pain, leg swelling, palpitations, orthopnea GI: SEE HPI MUSCULOSKELETAL: Negative for joint pain or swelling, back pain, and muscle pain. SKIN: Negative for lesions, rash HEMATOLOGY Negative for prolonged bleeding, bruising easily, and swollen nodes. ENDOCRINE: Negative for cold or heat intolerance, polyuria, polydipsia and goiter. NEURO: negative for tremor, gait imbalance, syncope and seizures. The remainder of the review of systems is noncontributory.   Physical Exam: BP 119/73   Pulse (!) 59   Temp 98.1 F (36.7 C)   Ht 5' 4 (1.626 m)   Wt 137  lb 1.6 oz (62.2 kg)   BMI 23.53 kg/m  GENERAL: The patient is AO x3, in no acute distress. HEENT: Head is normocephalic and atraumatic. EOMI are intact. Mouth is well hydrated and without lesions. NECK: Supple. No masses LUNGS: Clear to auscultation. No presence of rhonchi/wheezing/rales. Adequate chest expansion HEART: RRR, normal s1 and s2. ABDOMEN: Soft, nontender, no guarding, no peritoneal signs, and nondistended. BS +. No masses.   Imaging/Labs: as above     Latest Ref Rng & Units 04/10/2024   11:46 AM 03/02/2024    4:54 AM 03/01/2024    3:21 AM  CBC  WBC 3.4 - 10.8 x10E3/uL 13.9  10.4  17.8   Hemoglobin 13.0 - 17.7 g/dL 86.2  8.5  9.0   Hematocrit 37.5 - 51.0 % 43.3  25.7  26.6   Platelets 150 - 450 x10E3/uL 312  253  279    Lab Results  Component Value Date   IRON  69 04/10/2024   TIBC 390 04/10/2024   FERRITIN 269 04/10/2024    I personally reviewed and interpreted the available labs, imaging and endoscopic files.  Impression and Plan:  This is a 68 y.o. male with a history of anxiety, arthritis, CKD, COPD, HTN, and sleep apnea is here for follow-up after being hospitalized due to posthemorrhagic shock secondary to peptic ulcer disease and small bowel AVMs.  # Peptic ulcer disease # Small bowel angioectasia  The patient was hospitalized for severe anemia (Hgb 5.4) and melena and underwent upper endoscopy with ablation of small-bowel AVMs. No  recurrent overt bleeding, and hemoglobin has normalized to 13.7. Reports returning to baseline bowel habits with daily formed brown stools and has no current active complaints.  Biopsies were negative for H. pylori  Recommendation   -continue PPI daily - Avoid NSAIDS except Aspirin  81 mg  - If further bleeding is encountered or drop in HBG may need capsule endoscopy as patient may have more small bowel angioectasias not examined   #Colonic polyps  History of tubular adenomas.  Suggest repeat in 3 years from last  colonoscopy All questions were answered.      Stepfanie Yott Faizan Yamari Ventola, MD Gastroenterology and Hepatology [facility]astroenterology   This chart has been completed using [facility] Dictation software, and while attempts have been made to ensure accuracy , certain words and phrases may not be transcribed as intended

## 2024-05-22 ENCOUNTER — Other Ambulatory Visit: Payer: Self-pay | Admitting: Internal Medicine

## 2024-05-22 DIAGNOSIS — M4722 Other spondylosis with radiculopathy, cervical region: Secondary | ICD-10-CM

## 2024-05-22 DIAGNOSIS — G894 Chronic pain syndrome: Secondary | ICD-10-CM

## 2024-05-22 DIAGNOSIS — M51362 Other intervertebral disc degeneration, lumbar region with discogenic back pain and lower extremity pain: Secondary | ICD-10-CM

## 2024-05-29 ENCOUNTER — Ambulatory Visit: Admitting: Internal Medicine

## 2024-05-29 ENCOUNTER — Encounter: Payer: Self-pay | Admitting: Internal Medicine

## 2024-05-29 VITALS — BP 139/75 | HR 79 | Ht 64.0 in | Wt 142.4 lb

## 2024-05-29 DIAGNOSIS — K31819 Angiodysplasia of stomach and duodenum without bleeding: Secondary | ICD-10-CM

## 2024-05-29 DIAGNOSIS — I5020 Unspecified systolic (congestive) heart failure: Secondary | ICD-10-CM

## 2024-05-29 DIAGNOSIS — M4722 Other spondylosis with radiculopathy, cervical region: Secondary | ICD-10-CM

## 2024-05-29 DIAGNOSIS — F411 Generalized anxiety disorder: Secondary | ICD-10-CM

## 2024-05-29 DIAGNOSIS — M51362 Other intervertebral disc degeneration, lumbar region with discogenic back pain and lower extremity pain: Secondary | ICD-10-CM

## 2024-05-29 DIAGNOSIS — G894 Chronic pain syndrome: Secondary | ICD-10-CM

## 2024-05-29 DIAGNOSIS — I10 Essential (primary) hypertension: Secondary | ICD-10-CM

## 2024-05-29 MED ORDER — AMLODIPINE BESYLATE 5 MG PO TABS: 5.0000 mg | ORAL_TABLET | Freq: Every day | ORAL | 3 refills | Status: AC

## 2024-05-29 MED ORDER — LISINOPRIL 40 MG PO TABS: 20.0000 mg | ORAL_TABLET | Freq: Every day | ORAL | 3 refills | Status: AC

## 2024-05-29 NOTE — Assessment & Plan Note (Signed)
 From cervical spondylosis and DDD of lumbar spine Takes chronic opioids, also on chronic benzodiazepines -referred to [facility] pain clinic for chronic opioid management, but was not pleased with experience I have agreed to manage chronic pain for now - if higher dosing needed, will refer to [facility]

## 2024-05-29 NOTE — Patient Instructions (Signed)
 Please start taking Lisinopril  half tablet and Amlodipine  regularly.  Please continue to take medications as prescribed.  Please continue to follow low salt diet and ambulate as tolerated.

## 2024-05-29 NOTE — Progress Notes (Signed)
 Established Patient Office Visit  Subjective:  Patient ID: Russell [name], male    DOB: 05-Apr-1956  Age: 68 y.o. MRN: [id number]  CC:  Chief Complaint  Patient presents with   Hypertension    4 month f/u    Pain    4 month f/u     HPI Russell [name] is a 68 y.o. male with past medical history of HTN, aortic stenosis s/p aortic valve replacement, HLD, GAD, chronic pain syndrome and tobacco abuse who presents for f/u of his chronic medical conditions.  Chronic pain syndrome from cervical spondylosis and lumbar DDD: He has chronic neck and back pain, worse with movement and is associated with numbness and weakness of the LE.  He is taking Norco 10-325 mg 4 times in a day. He has cut down use of Ibuprofen since increasing dose of Norco.  He has also improvement in neuropathic pain with gabapentin , but still has intermittent numbness of the LE.  HTN: BP is wnl today. Takes Amlodipine  5 mg, carvedilol  12.5 mg twice daily and lisinopril  20 mg QD. Patient denies headache, dizziness, chest pain, dyspnea or palpitations. He used to see [facility] cardiology clinic for routine follow-up, but is planned to see [facility] Cardiology clinic in [place] now.  He takes Lasix  20 mg QD for leg swelling.  GAD: He has tried SSRIs in the past.  He currently takes Xanax  1 mg 3 times in a day. Denies anhedonia.  He feels anxious at times due to his wife's medical conditions.  COPD: He uses albuterol  as needed for dyspnea and wheezing.  He still smokes about 0.25 pack/day.  Past Medical History:  Diagnosis Date   Anxiety    Arthritis    Back pain    Cervical disc disease    C6-7   Chronic kidney disease    kidney cyst   COPD (chronic obstructive pulmonary disease) (HCC)    Essential hypertension    Lung nodules    Shortness of breath dyspnea    Sleep apnea    per wife, no sleep study    Past Surgical History:  Procedure Laterality Date   AORTIC VALVE REPLACEMENT N/A 06/17/2014   Procedure: AORTIC  VALVE REPLACEMENT (AVR);  Surgeon: Dorise MARLA Fellers, MD;  Location: [facility] OR;  Service: Open Heart Surgery;  Laterality: N/A;   BACK SURGERY     CARDIAC CATHETERIZATION  05/23/14   COLONOSCOPY  10/19/2011   Procedure: COLONOSCOPY;  Surgeon: Oneil DELENA Budge, MD;  Location: [facility];  Service: Gastroenterology;  Laterality: N/A;   COLONOSCOPY WITH PROPOFOL  N/A 08/16/2023   Procedure: COLONOSCOPY WITH PROPOFOL ;  Surgeon: Eartha Angelia Sieving, MD;  Location: [facility];  Service: Gastroenterology;  Laterality: N/A;  1:00PM;ASA 3   ESOPHAGOGASTRODUODENOSCOPY N/A 03/01/2024   Procedure: EGD (ESOPHAGOGASTRODUODENOSCOPY);  Surgeon: Cinderella [name], MD;  Location: [facility];  Service: Endoscopy;  Laterality: N/A;   LEFT AND RIGHT HEART CATHETERIZATION WITH CORONARY ANGIOGRAM N/A 05/23/2014   Procedure: LEFT AND RIGHT HEART CATHETERIZATION WITH CORONARY ANGIOGRAM;  Surgeon: Candyce GORMAN Reek, MD;  Location: [facility] CATH LAB;  Service: Cardiovascular;  Laterality: N/A;   MITRAL VALVE REPAIR N/A 06/17/2014   Procedure: MITRAL VALVE REPAIR (MVR);  Surgeon: Dorise MARLA Fellers, MD;  Location[facility] OR;  Service: Open Heart Surgery;  Laterality: N/A;   POLYPECTOMY  08/16/2023   Procedure: POLYPECTOMY, INTESTINE;  Surgeon: Eartha Angelia Sieving, MD;  Location: [facility];  Service: Gastroenterology;;   Right knee surgery  Cartilage removed   TEE WITHOUT CARDIOVERSION N/A 06/17/2014   Procedure: TRANSESOPHAGEAL ECHOCARDIOGRAM (TEE);  Surgeon: Dorise MARLA Fellers, MD;  Location: [facility] OR;  Service: Open Heart Surgery;  Laterality: N/A;    Family History  Problem Relation Age of Onset   Atrial fibrillation Mother    Heart disease Father    Heart disease Maternal Grandfather     Social History   Socioeconomic History   Marital status: Married    Spouse name: Not on file   Number of children: Not on file   Years of education: Not on file   Highest education level: Not on file  Occupational History   Not on  file  Tobacco Use   Smoking status: Every Day    Current packs/day: 0.25    Average packs/day: 0.3 packs/day for 55.9 years (14.0 ttl pk-yrs)    Types: Cigarettes    Start date: 06/22/1968   Smokeless tobacco: Never   Tobacco comments:    patient restarted smoking  Vaping Use   Vaping status: Never Used  Substance and Sexual Activity   Alcohol use: No    Alcohol/week: 0.0 standard drinks of alcohol   Drug use: No   Sexual activity: Yes  Other Topics Concern   Not on file  Social History Narrative   Not on file   Social Drivers of Health   Tobacco Use: High Risk (05/29/2024)   Patient History    Smoking Tobacco Use: Every Day    Smokeless Tobacco Use: Never    Passive Exposure: Not on file  Financial Resource Strain: Not on file  Food Insecurity: No Food Insecurity (03/05/2024)   Epic    Worried About Radiation Protection Practitioner of Food in the Last Year: Never true    Ran Out of Food in the Last Year: Never true  Transportation Needs: No Transportation Needs (03/05/2024)   Epic    Lack of Transportation (Medical): No    Lack of Transportation (Non-Medical): No  Physical Activity: Not on file  Stress: Not on file  Social Connections: Moderately Isolated (02/29/2024)   Social Connection and Isolation Panel    Frequency of Communication with Friends and Family: More than three times a week    Frequency of Social Gatherings with Friends and Family: More than three times a week    Attends Religious Services: Never    Database Administrator or Organizations: No    Attends Banker Meetings: Never    Marital Status: Married  Catering Manager Violence: Not At Risk (03/05/2024)   Epic    Fear of Current or Ex-Partner: No    Emotionally Abused: No    Physically Abused: No    Sexually Abused: No  Depression (PHQ2-9): Low Risk (05/29/2024)   Depression (PHQ2-9)    PHQ-2 Score: 0  Alcohol Screen: Not on file  Housing: Unknown (03/05/2024)   Epic    Unable to Pay for Housing in the  Last Year: No    Number of Times Moved in the Last Year: Not on file    Homeless in the Last Year: No  Utilities: Not At Risk (03/05/2024)   Epic    Threatened with loss of utilities: No  Health Literacy: Not on file    Outpatient Medications Prior to Visit  Medication Sig Dispense Refill   albuterol  (PROVENTIL  HFA;VENTOLIN  HFA) 108 (90 BASE) MCG/ACT inhaler Inhale 1-2 puffs into the lungs every 6 (six) hours as needed for wheezing or shortness of breath.  ALPRAZolam  (XANAX ) 1 MG tablet Take 0.5-1 tablets (0.5-1 mg total) by mouth 3 (three) times daily as needed for anxiety. 90 tablet 1   carvedilol  (COREG ) 12.5 MG tablet Take 1 tablet (12.5 mg total) by mouth 2 (two) times daily. 60 tablet 5   fenofibrate 54 MG tablet TAKE 1 TABLET BY MOUTH DAILY FOR HIGH TRIGLYCERIDES. 90 tablet 3   ferrous gluconate  (FERGON) 324 MG tablet Take 1 tablet (324 mg total) by mouth 2 (two) times daily with a meal. 60 tablet 1   furosemide  (LASIX ) 20 MG tablet Take 20 mg by mouth as needed for fluid or edema.     gabapentin  (NEURONTIN ) 300 MG capsule TAKE ONE CAPSULE BY MOUTH TWICE DAILY 180 capsule 1   HYDROcodone -acetaminophen  (NORCO) 10-325 MG tablet Take 1 tablet by mouth every 6 (six) hours as needed. 120 tablet 0   Multiple Vitamin (MULTI-VITAMIN PO) Take 1 tablet by mouth 2 (two) times daily.      pantoprazole  (PROTONIX ) 40 MG tablet Take 1 tablet (40 mg total) by mouth daily. 90 tablet 1   potassium chloride  SA (KLOR-CON  M) 20 MEQ tablet Take 20 mEq by mouth daily.     amLODipine  (NORVASC ) 5 MG tablet Take 1 tablet (5 mg total) by mouth daily. 90 tablet 1   lisinopril  (ZESTRIL ) 40 MG tablet Take 40 mg by mouth daily. (Patient taking differently: Take 20 mg by mouth daily. Half a pill)     No facility-administered medications prior to visit.    Allergies  Allergen Reactions   Dorethia Faster ] Diarrhea and Nausea And Vomiting   Prozac [Fluoxetine Hcl] Other (See Comments)    Confusion      ROS Review of Systems  Constitutional:  Negative for chills and fever.  HENT:  Negative for congestion and sore throat.   Eyes:  Negative for pain and discharge.  Respiratory:  Negative for cough and shortness of breath.   Cardiovascular:  Negative for chest pain and palpitations.  Gastrointestinal:  Negative for diarrhea, nausea and vomiting.  Endocrine: Negative for polydipsia and polyuria.  Genitourinary:  Negative for dysuria and hematuria.  Musculoskeletal:  Positive for back pain, gait problem and neck pain. Negative for neck stiffness.  Skin:  Negative for rash.  Neurological:  Positive for weakness (B/l LE) and numbness. Negative for dizziness and headaches.  Psychiatric/Behavioral:  Positive for sleep disturbance. Negative for agitation and behavioral problems. The patient is nervous/anxious.       Objective:    Physical Exam Vitals reviewed.  Constitutional:      General: He is not in acute distress.    Appearance: He is not diaphoretic.  HENT:     Head: Normocephalic and atraumatic.     Nose: Nose normal.     Mouth/Throat:     Mouth: Mucous membranes are moist.  Eyes:     General: No scleral icterus.    Extraocular Movements: Extraocular movements intact.  Cardiovascular:     Rate and Rhythm: Normal rate and regular rhythm.     Heart sounds: Normal heart sounds. No murmur heard. Pulmonary:     Breath sounds: Normal breath sounds. No wheezing or rales.  Musculoskeletal:     Cervical back: Neck supple. Tenderness present. Pain with movement present. Decreased range of motion.     Lumbar back: Tenderness present. Decreased range of motion.     Right lower leg: No edema.     Left lower leg: No edema.     Comments: Lumbar back  brace in place  Skin:    General: Skin is warm.     Findings: No rash.  Neurological:     General: No focal deficit present.     Mental Status: He is alert and oriented to person, place, and time.     Sensory: Sensory deficit (B/l  LE) present.     Motor: Weakness (B/l LE - 4/5) present.  Psychiatric:        Mood and Affect: Mood normal.        Behavior: Behavior normal.     BP 139/75   Pulse 79   Ht 5' 4 (1.626 m)   Wt 142 lb 6.4 oz (64.6 kg)   SpO2 93%   BMI 24.44 kg/m  Wt Readings from Last 3 Encounters:  05/29/24 142 lb 6.4 oz (64.6 kg)  05/02/24 137 lb 1.6 oz (62.2 kg)  04/10/24 136 lb 9.6 oz (62 kg)    Lab Results  Component Value Date   TSH 0.638 05/16/2014   Lab Results  Component Value Date   WBC 13.9 (H) 04/10/2024   HGB 13.7 04/10/2024   HCT 43.3 04/10/2024   MCV 91 04/10/2024   PLT 312 04/10/2024   Lab Results  Component Value Date   NA 142 04/10/2024   K 4.4 04/10/2024   CO2 22 04/10/2024   GLUCOSE 96 04/10/2024   BUN 12 04/10/2024   CREATININE 1.06 04/10/2024   BILITOT 0.4 03/01/2024   ALKPHOS 41 03/01/2024   AST 20 03/01/2024   ALT 14 03/01/2024   PROT 5.1 (L) 03/01/2024   ALBUMIN  2.5 (L) 03/01/2024   CALCIUM  11.4 (H) 04/10/2024   ANIONGAP 6 03/02/2024   EGFR 75 04/10/2024   Lab Results  Component Value Date   CHOL 152 01/27/2024   Lab Results  Component Value Date   HDL 53 01/27/2024   Lab Results  Component Value Date   LDLCALC 79 01/27/2024   Lab Results  Component Value Date   TRIG 110 01/27/2024   Lab Results  Component Value Date   CHOLHDL 2.9 01/27/2024   Lab Results  Component Value Date   HGBA1C 5.5 01/27/2024      Assessment & Plan:   Problem List Items Addressed This Visit       Cardiovascular and Mediastinum   Essential hypertension   BP Readings from Last 1 Encounters:  05/29/24 139/75   Well-controlled with amlodipine  5 mg QD, carvedilol  12.5 mg BID, lisinopril  20 mg QD and Lasix  20 mg QD overall He has not taken Lisinopril  today, advised to take 20 mg dose - new prescription sent Counseled for compliance with the medications Advised DASH diet and moderate exercise/walking, at least 150 mins/week      Relevant Medications    amLODipine  (NORVASC ) 5 MG tablet   lisinopril  (ZESTRIL ) 40 MG tablet   Heart failure with improved ejection fraction (HFimpEF) (HCC)   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 -LVEF has recovered on GDMT post AVR/MVR  Euvolemic on examination today -takes Lasix  20 mg QD PRN Used to follow-up with Cardiology at [facility], but referred to [facility] Cardiology clinic due to insurance preference      Relevant Medications   amLODipine  (NORVASC ) 5 MG tablet   lisinopril  (ZESTRIL ) 40 MG tablet   Angiectasia of gastrointestinal tract - Primary   Recently had EGD for melena, now resolved Followed by GI Continue pantoprazole       Relevant Medications   amLODipine  (NORVASC ) 5 MG tablet  lisinopril  (ZESTRIL ) 40 MG tablet     Nervous and Auditory   Cervical spondylosis with radiculopathy   Has chronic neck pain Currently takes Norco 10-325 mg q6h PRN - referred to pain clinic, dose was decreased, he had severe worsening of pain and fatigue On Norco 10-325 mg q6h PRN now, have agreed to manage his chronic pain for now - PDMP reviewed On gabapentin  300 mg BID for persistent neck pain, has radicular pain towards UE and occipital area        Musculoskeletal and Integument   DDD (degenerative disc disease), lumbar   Has chronic low back pain with radicular symptoms Currently takes Norco 10-325 mg q6h PRN - referred to pain clinic, but does not want to go to the pain management for now On gabapentin  300 mg BID        Other   GAD (generalized anxiety disorder)   Has tried SSRIs in the past, did not get much relief Has been taking Xanax  1 mg q8h PRN - PDMP reviewed After lengthy discussion, he has cut down dose of Xanax  to 1 mg 3 times daily as needed for now      Chronic pain syndrome   From cervical spondylosis and DDD of lumbar spine Takes chronic opioids, also on chronic benzodiazepines -referred to [facility] pain clinic for chronic opioid management, but was not pleased with  experience I have agreed to manage chronic pain for now - if higher dosing needed, will refer to [facility] [address]      Hypercalcemia   Last few CMPs show borderline elevated calcium  Last BMP showed Ca of 11.4 Check BMP and PTH again Will closely monitor for now, low threshold for Endocrinology referral      Relevant Orders   Basic Metabolic Panel (BMET)   Parathyroid  hormone, intact (no Ca)        Meds ordered this encounter  Medications   amLODipine  (NORVASC ) 5 MG tablet    Sig: Take 1 tablet (5 mg total) by mouth daily.    Dispense:  90 tablet    Refill:  3   lisinopril  (ZESTRIL ) 40 MG tablet    Sig: Take 0.5 tablets (20 mg total) by mouth daily.    Dispense:  45 tablet    Refill:  3    Follow-up: Return in about 4 months (around 09/27/2024).    Suzzane MARLA Blanch, MD

## 2024-05-29 NOTE — Assessment & Plan Note (Addendum)
 Has chronic neck pain Currently takes Norco 10-325 mg q6h PRN - referred to pain clinic, dose was decreased, he had severe worsening of pain and fatigue On Norco 10-325 mg q6h PRN now, have agreed to manage his chronic pain for now - PDMP reviewed On gabapentin  300 mg BID for persistent neck pain, has radicular pain towards UE and occipital area

## 2024-05-29 NOTE — Assessment & Plan Note (Signed)
 Has tried SSRIs in the past, did not get much relief Has been taking Xanax 1 mg q8h PRN - PDMP reviewed After lengthy discussion, he has cut down dose of Xanax to 1 mg 3 times daily as needed for now

## 2024-05-29 NOTE — Assessment & Plan Note (Signed)
 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 -LVEF has recovered on GDMT post AVR/MVR  Euvolemic on examination today -takes Lasix  20 mg QD PRN Used to follow-up with Cardiology at [facility], but referred to [facility] Cardiology clinic due to insurance preference

## 2024-05-29 NOTE — Assessment & Plan Note (Signed)
 Last few CMPs show borderline elevated calcium  Last BMP showed Ca of 11.4 Check BMP and PTH again Will closely monitor for now, low threshold for Endocrinology referral

## 2024-05-29 NOTE — Assessment & Plan Note (Addendum)
 BP Readings from Last 1 Encounters:  05/29/24 139/75   Well-controlled with amlodipine  5 mg QD, carvedilol  12.5 mg BID, lisinopril  20 mg QD and Lasix  20 mg QD overall He has not taken Lisinopril  today, advised to take 20 mg dose - new prescription sent Counseled for compliance with the medications Advised DASH diet and moderate exercise/walking, at least 150 mins/week

## 2024-05-29 NOTE — Assessment & Plan Note (Addendum)
 Recently had EGD for melena, now resolved Followed by GI Continue pantoprazole 

## 2024-05-29 NOTE — Assessment & Plan Note (Addendum)
 Has chronic low back pain with radicular symptoms Currently takes Norco 10-325 mg q6h PRN - referred to pain clinic, but does not want to go to the pain management for now On gabapentin  300 mg BID

## 2024-05-31 ENCOUNTER — Ambulatory Visit: Payer: Self-pay | Admitting: Internal Medicine

## 2024-05-31 LAB — BASIC METABOLIC PANEL WITH GFR
BUN/Creatinine Ratio: 9 — ABNORMAL LOW (ref 10–24)
BUN: 8 mg/dL (ref 8–27)
CO2: 25 mmol/L (ref 20–29)
Calcium: 10.9 mg/dL — ABNORMAL HIGH (ref 8.6–10.2)
Chloride: 101 mmol/L (ref 96–106)
Creatinine, Ser: 0.85 mg/dL (ref 0.76–1.27)
Glucose: 88 mg/dL (ref 70–99)
Potassium: 4.5 mmol/L (ref 3.5–5.2)
Sodium: 139 mmol/L (ref 134–144)
eGFR: 93 mL/min/1.73 (ref >59)

## 2024-05-31 LAB — PARATHYROID HORMONE, INTACT (NO CA): PTH: 14 pg/mL — AB (ref 15–65)

## 2024-06-13 ENCOUNTER — Other Ambulatory Visit: Payer: Self-pay | Admitting: Internal Medicine

## 2024-06-13 DIAGNOSIS — F411 Generalized anxiety disorder: Secondary | ICD-10-CM

## 2024-06-19 ENCOUNTER — Ambulatory Visit: Admitting: Internal Medicine

## 2024-06-19 ENCOUNTER — Other Ambulatory Visit: Payer: Self-pay | Admitting: Internal Medicine

## 2024-06-19 DIAGNOSIS — M4722 Other spondylosis with radiculopathy, cervical region: Secondary | ICD-10-CM

## 2024-06-19 DIAGNOSIS — M51362 Other intervertebral disc degeneration, lumbar region with discogenic back pain and lower extremity pain: Secondary | ICD-10-CM

## 2024-06-19 DIAGNOSIS — G894 Chronic pain syndrome: Secondary | ICD-10-CM

## 2024-06-19 DIAGNOSIS — F411 Generalized anxiety disorder: Secondary | ICD-10-CM

## 2024-06-19 NOTE — Progress Notes (Signed)
 Erroneous encounter - please disregard.

## 2024-07-20 ENCOUNTER — Other Ambulatory Visit: Payer: Self-pay | Admitting: Internal Medicine

## 2024-07-20 DIAGNOSIS — G894 Chronic pain syndrome: Secondary | ICD-10-CM

## 2024-07-20 DIAGNOSIS — M51362 Other intervertebral disc degeneration, lumbar region with discogenic back pain and lower extremity pain: Secondary | ICD-10-CM

## 2024-07-20 DIAGNOSIS — M4722 Other spondylosis with radiculopathy, cervical region: Secondary | ICD-10-CM

## 2024-09-27 ENCOUNTER — Ambulatory Visit: Payer: Self-pay | Admitting: Internal Medicine
